# Patient Record
Sex: Female | Born: 1990 | Hispanic: No | Marital: Married | Smoking: Never smoker
Health system: Southern US, Community
[De-identification: ages and names within clinical notes are randomized; demographics above are authoritative.]

## PROBLEM LIST (undated history)

## (undated) ENCOUNTER — Inpatient Hospital Stay (HOSPITAL_COMMUNITY): Payer: Self-pay

## (undated) ENCOUNTER — Emergency Department (HOSPITAL_COMMUNITY): Admission: EM | Payer: 59 | Source: Home / Self Care

## (undated) DIAGNOSIS — Z789 Other specified health status: Secondary | ICD-10-CM

## (undated) DIAGNOSIS — M25512 Pain in left shoulder: Secondary | ICD-10-CM

## (undated) DIAGNOSIS — K219 Gastro-esophageal reflux disease without esophagitis: Secondary | ICD-10-CM

## (undated) HISTORY — DX: Gastro-esophageal reflux disease without esophagitis: K21.9

## (undated) HISTORY — PX: NO PAST SURGERIES: SHX2092

## (undated) SURGERY — Surgical Case
Anesthesia: *Unknown

---

## 2017-02-21 ENCOUNTER — Encounter (HOSPITAL_COMMUNITY): Payer: Self-pay | Admitting: *Deleted

## 2017-02-21 ENCOUNTER — Inpatient Hospital Stay (HOSPITAL_COMMUNITY): Payer: BLUE CROSS/BLUE SHIELD

## 2017-02-21 ENCOUNTER — Other Ambulatory Visit: Payer: Self-pay

## 2017-02-21 ENCOUNTER — Inpatient Hospital Stay (HOSPITAL_COMMUNITY)
Admission: AD | Admit: 2017-02-21 | Discharge: 2017-02-21 | Disposition: A | Discharge status: Home / Self Care | Payer: BLUE CROSS/BLUE SHIELD | Source: Ambulatory Visit | Attending: Obstetrics and Gynecology | Admitting: Obstetrics and Gynecology

## 2017-02-21 DIAGNOSIS — O200 Threatened abortion: Secondary | ICD-10-CM

## 2017-02-21 DIAGNOSIS — Z3A08 8 weeks gestation of pregnancy: Secondary | ICD-10-CM | POA: Insufficient documentation

## 2017-02-21 DIAGNOSIS — R58 Hemorrhage, not elsewhere classified: Secondary | ICD-10-CM

## 2017-02-21 DIAGNOSIS — O4690 Antepartum hemorrhage, unspecified, unspecified trimester: Secondary | ICD-10-CM

## 2017-02-21 DIAGNOSIS — O4691 Antepartum hemorrhage, unspecified, first trimester: Secondary | ICD-10-CM | POA: Diagnosis not present

## 2017-02-21 LAB — URINALYSIS, ROUTINE W REFLEX MICROSCOPIC
Bilirubin Urine: NEGATIVE
GLUCOSE, UA: NEGATIVE mg/dL
KETONES UR: NEGATIVE mg/dL
Leukocytes, UA: NEGATIVE
Nitrite: NEGATIVE
PH: 5 (ref 5.0–8.0)
Protein, ur: NEGATIVE mg/dL
SPECIFIC GRAVITY, URINE: 1.023 (ref 1.005–1.030)

## 2017-02-21 LAB — WET PREP, GENITAL
CLUE CELLS WET PREP: NONE SEEN
SPERM: NONE SEEN
TRICH WET PREP: NONE SEEN
YEAST WET PREP: NONE SEEN

## 2017-02-21 LAB — POCT PREGNANCY, URINE: Preg Test, Ur: POSITIVE — AB

## 2017-02-21 NOTE — Progress Notes (Signed)
AVS E signature not working in room 1.  Patient signed printed copy.

## 2017-02-21 NOTE — MAU Note (Signed)
Chief Complaint: No chief complaint on file.   None    SUBJECTIVE HPI: Anne Whitaker is a 26 y.o. G1P0000 at 6960w2d who presents to Maternity Admissions reporting bleeding.  Pt states had intercourse yesterday.  Bleeding is brown today.  Denies cramping or pain.  Does have discharge with slight odor.  Location: vaginal bleeding Quality: scant Severity: 0/10 on pain scale Duration: yesterday  Modifying factors: None Associated signs and symptoms: after intercourse  History reviewed. No pertinent past medical history. OB History  Gravida Para Term Preterm AB Living  1 0 0 0 0 0  SAB TAB Ectopic Multiple Live Births  0 0 0 0 0    # Outcome Date GA Lbr Len/2nd Weight Sex Delivery Anes PTL Lv  1 Current              History reviewed. No pertinent surgical history. Social History   Socioeconomic History  . Marital status: Married    Spouse name: Not on file  . Number of children: Not on file  . Years of education: Not on file  . Highest education level: Not on file  Social Needs  . Financial resource strain: Not on file  . Food insecurity - worry: Not on file  . Food insecurity - inability: Not on file  . Transportation needs - medical: Not on file  . Transportation needs - non-medical: Not on file  Occupational History  . Not on file  Tobacco Use  . Smoking status: Never Smoker  . Smokeless tobacco: Never Used  Substance and Sexual Activity  . Alcohol use: No    Frequency: Never  . Drug use: No  . Sexual activity: Yes  Other Topics Concern  . Not on file  Social History Narrative  . Not on file   History reviewed. No pertinent family history. No current facility-administered medications on file prior to encounter.    No current outpatient medications on file prior to encounter.   No Known Allergies  I have reviewed patient's Past Medical Hx, Surgical Hx, Family Hx, Social Hx, medications and allergies.   Review of Systems  HENT: Negative.   Eyes:  Negative.   Respiratory: Negative.   Cardiovascular: Negative.   Gastrointestinal: Negative.   Endocrine: Negative.   Genitourinary: Positive for vaginal bleeding.  Musculoskeletal: Negative.   Allergic/Immunologic: Negative.   Neurological: Negative.   Hematological: Negative.   Psychiatric/Behavioral: Negative.     OBJECTIVE Patient Vitals for the past 24 hrs:  BP Temp Temp src Pulse Resp SpO2 Weight  02/21/17 1419 (!) 115/59 98.3 F (36.8 C) Oral 65 17 100 % 69.3 kg (152 lb 12 oz)   Constitutional: Well-developed, well-nourished female in no acute distress.  Cardiovascular: normal rate Respiratory: normal rate and effort.  GI: Abd soft, non-tender, gravid appropriate for gestational age. Pos BS x 4 MS: Extremities nontender, no edema, normal ROM Neurologic: Alert and oriented x 4.  GU: Neg CVAT.  SPECULUM EXAM: NEFG, physiologic discharge,old blood noted, cervix clean  BIMANUAL: cervix closed; uterus 9 week size, no adnexal tenderness or masses.  No CMT.  LAB RESULTS Results for orders placed or performed during the hospital encounter of 02/21/17 (from the past 24 hour(s))  Urinalysis, Routine w reflex microscopic     Status: Abnormal   Collection Time: 02/21/17  2:21 PM  Result Value Ref Range   Color, Urine YELLOW YELLOW   APPearance HAZY (A) CLEAR   Specific Gravity, Urine 1.023 1.005 - 1.030   pH  5.0 5.0 - 8.0   Glucose, UA NEGATIVE NEGATIVE mg/dL   Hgb urine dipstick MODERATE (A) NEGATIVE   Bilirubin Urine NEGATIVE NEGATIVE   Ketones, ur NEGATIVE NEGATIVE mg/dL   Protein, ur NEGATIVE NEGATIVE mg/dL   Nitrite NEGATIVE NEGATIVE   Leukocytes, UA NEGATIVE NEGATIVE   RBC / HPF 0-5 0 - 5 RBC/hpf   WBC, UA 0-5 0 - 5 WBC/hpf   Bacteria, UA MANY (A) NONE SEEN   Squamous Epithelial / LPF 0-5 (A) NONE SEEN   Mucus PRESENT   Pregnancy, urine POC     Status: Abnormal   Collection Time: 02/21/17  2:43 PM  Result Value Ref Range   Preg Test, Ur POSITIVE (A) NEGATIVE   wet mount negative  IMAGING IMPRESSION: 1. Single living intrauterine gestation at 8 weeks 5 days by crown-rump length, concordant with provided menstrual dating. 2. No acute first-trimester gestational abnormality.  MAU COURSE Orders Placed This Encounter  Procedures  . Urinalysis, Routine w reflex microscopic  . Pregnancy, urine POC  OB US No orders of the defined types were placed in this encounter.   MDM PE, speculum exam, wet mount sent.  GC/Chlamydia in process at office. ASSESSMENT Threatened abortion  PLAN Discharge home in stable condition.  Discussed US wnl.  F/U regular visit.  Discussed common for bleeding after intercourse.   Allergies as of 02/21/2017   No Known Allergies     Medication List    You have not been prescribed any medications.      Kenney Housemanrothero, Analyssa Downs Jean, CNM 02/21/2017  4:40 PM

## 2017-02-21 NOTE — Discharge Instructions (Signed)
Vaginal Bleeding During Pregnancy, First Trimester °A small amount of bleeding (spotting) from the vagina is common in early pregnancy. Sometimes the bleeding is normal and is not a problem, and sometimes it is a sign of something serious. Be sure to tell your doctor about any bleeding from your vagina right away. °Follow these instructions at home: °· Watch your condition for any changes. °· Follow your doctor's instructions about how active you can be. °· If you are on bed rest: °? You may need to stay in bed and only get up to use the bathroom. °? You may be allowed to do some activities. °? If you need help, make plans for someone to help you. °· Write down: °? The number of pads you use each day. °? How often you change pads. °? How soaked (saturated) your pads are. °· Do not use tampons. °· Do not douche. °· Do not have sex or orgasms until your doctor says it is okay. °· If you pass any tissue from your vagina, save the tissue so you can show it to your doctor. °· Only take medicines as told by your doctor. °· Do not take aspirin because it can make you bleed. °· Keep all follow-up visits as told by your doctor. °Contact a doctor if: °· You bleed from your vagina. °· You have cramps. °· You have labor pains. °· You have a fever that does not go away after you take medicine. °Get help right away if: °· You have very bad cramps in your back or belly (abdomen). °· You pass large clots or tissue from your vagina. °· You bleed more. °· You feel light-headed or weak. °· You pass out (faint). °· You have chills. °· You are leaking fluid or have a gush of fluid from your vagina. °· You pass out while pooping (having a bowel movement). °This information is not intended to replace advice given to you by your health care provider. Make sure you discuss any questions you have with your health care provider. °Document Released: 06/27/2013 Document Revised: 07/19/2015 Document Reviewed: 10/18/2012 °Elsevier Interactive  Patient Education © 2018 [employer] ° °

## 2017-02-21 NOTE — MAU Note (Signed)
Yesterday and this morning, she is bleeding.  Yesterday was heavier - only 1 pad, today small amt of brown. Some cramping.  Has been to [facility] with this preg.

## 2017-02-24 NOTE — L&D Delivery Note (Addendum)
Delivery Note Called by Drenda FreezeKatie Page stating the pt is s/p epidural with decels down to 90s.  Bernerd PhoNancy Prothero, CNM and Florentina AddisonKatie Page, CNM will start pushing with the pt.  Upon my arrival, the pt was pushing and bulging membranes visible at the introitus.    At 8:31 PM a viable female was delivered via Vaginal, Breech (Presentation: footling breech).  APGAR: 1/4/7; Weight 1lb 3.8oz. Placenta status: spont intact.  Cord: 3V with the following complications: 3min head entrapment.  Cord pH: not enough to collect.  Anesthesia:  Epidural Episiotomy: None Lacerations: None Suture Repair: n/a Est. Blood Loss (mL):100 cc  Mom to postpartum.  Baby to NICU intubated.  Purcell Nailsngela Y Ladye Macnaughton 05/30/2017, 9:04 PM

## 2017-03-09 ENCOUNTER — Encounter (HOSPITAL_COMMUNITY): Payer: Self-pay | Admitting: *Deleted

## 2017-03-09 ENCOUNTER — Inpatient Hospital Stay (HOSPITAL_COMMUNITY)
Admission: AD | Admit: 2017-03-09 | Discharge: 2017-03-10 | Disposition: A | Discharge status: Home / Self Care | Payer: BLUE CROSS/BLUE SHIELD | Source: Ambulatory Visit | Attending: Obstetrics & Gynecology | Admitting: Obstetrics & Gynecology

## 2017-03-09 DIAGNOSIS — O209 Hemorrhage in early pregnancy, unspecified: Secondary | ICD-10-CM | POA: Diagnosis not present

## 2017-03-09 DIAGNOSIS — Z3A11 11 weeks gestation of pregnancy: Secondary | ICD-10-CM | POA: Diagnosis not present

## 2017-03-09 DIAGNOSIS — O2341 Unspecified infection of urinary tract in pregnancy, first trimester: Secondary | ICD-10-CM | POA: Diagnosis not present

## 2017-03-09 NOTE — MAU Note (Signed)
PT SAYS  VAG BLEEDING BEGAN IN DEC  X4 DAYS .    THEN RESTARTED  YESTERDAY .  HAS LOWER ABD  PAIN / CRAMPS IN TRIAGE - NO BLOOD IN  PANTS. LAST SEX- ON Friday .  IN OFFICE ON 12-27.  NEXT APPOINTMENT    1-22

## 2017-03-10 ENCOUNTER — Encounter (HOSPITAL_COMMUNITY): Payer: Self-pay | Admitting: *Deleted

## 2017-03-10 LAB — URINALYSIS, ROUTINE W REFLEX MICROSCOPIC
BILIRUBIN URINE: NEGATIVE
Glucose, UA: NEGATIVE mg/dL
Ketones, ur: 5 mg/dL — AB
LEUKOCYTES UA: NEGATIVE
Nitrite: NEGATIVE
PH: 5 (ref 5.0–8.0)
Protein, ur: NEGATIVE mg/dL
SPECIFIC GRAVITY, URINE: 1.029 (ref 1.005–1.030)

## 2017-03-10 MED ORDER — AMOXICILLIN-POT CLAVULANATE 875-125 MG PO TABS: 1.0000 | ORAL_TABLET | Freq: Two times a day (BID) | ORAL | 0 refills | Status: AC

## 2017-03-10 NOTE — MAU Provider Note (Signed)
Chief Complaint: Vaginal Bleeding   First Provider Initiated Contact with Patient 03/10/17 0006     SUBJECTIVE HPI: Anne Whitaker is a 27 y.o. G1P0000 at [redacted]w[redacted]d who presents to Maternity Admissions reporting bleeding x 2 days  Manson Passey today.  Having some coccyx pain today.  Does do heavy lifting at work.  Location: lower abd and coccyx Quality: cramping Severity: 3/10 on pain scale Duration: 2 days No past medical history on file. OB History  Gravida Para Term Preterm AB Living  1 0 0 0 0 0  SAB TAB Ectopic Multiple Live Births  0 0 0 0 0    # Outcome Date GA Lbr Len/2nd Weight Sex Delivery Anes PTL Lv  1 Current              No past surgical history on file. Social History   Socioeconomic History  . Marital status: Married    Spouse name: Not on file  . Number of children: Not on file  . Years of education: Not on file  . Highest education level: Not on file  Social Needs  . Financial resource strain: Not on file  . Food insecurity - worry: Not on file  . Food insecurity - inability: Not on file  . Transportation needs - medical: Not on file  . Transportation needs - non-medical: Not on file  Occupational History  . Not on file  Tobacco Use  . Smoking status: Never Smoker  . Smokeless tobacco: Never Used  Substance and Sexual Activity  . Alcohol use: No    Frequency: Never  . Drug use: No  . Sexual activity: Yes  Other Topics Concern  . Not on file  Social History Narrative  . Not on file   No family history on file. No current facility-administered medications on file prior to encounter.    No current outpatient medications on file prior to encounter.   No Known Allergies  I have reviewed patient's Past Medical Hx, Surgical Hx, Family Hx, Social Hx, medications and allergies.   Review of Systems  Constitutional: Negative.   HENT: Negative.   Eyes: Negative.   Respiratory: Negative.   Cardiovascular: Negative.   Gastrointestinal: Positive for  abdominal distention.  Endocrine: Negative.   Genitourinary: Positive for vaginal bleeding.  Musculoskeletal: Positive for back pain.  Allergic/Immunologic: Negative.   Neurological: Negative.   Hematological: Negative.   Psychiatric/Behavioral: Negative.     OBJECTIVE Patient Vitals for the past 24 hrs:  BP Temp Temp src Pulse Resp Height Weight  03/09/17 2251 114/60 97.9 F (36.6 C) Oral 63 18 5\' 2"  (1.575 m) 68.2 kg (150 lb 4 oz)   Constitutional: Well-developed, well-nourished female in no acute distress.  Cardiovascular: normal rate Respiratory: normal rate and effort.  GI: Abd soft, non-tender, gravid appropriate for gestational age. FHT 160 MS: Extremities nontender, no edema, normal ROM Neurologic: Alert and oriented x 4.  GU: Neg CVAT.  Pain with palpation at coccyx  SPECULUM EXAM: NEFG, physiologic discharge,dark brown blood noted,  BIMANUAL: cervix closed uterus 12  Week size, no adnexal tenderness or masses.  No CMT.  LAB RESULTS UA pending   Positive UTI per culture noted in office no treatment. + KLEB O POS IMAGING US Ob Comp Less 14 Wks  Result Date: 02/21/2017 CLINICAL DATA:  27 year old pregnant female presents with vaginal bleeding. EDC by LMP: 09/24/2017, projecting to an expected gestational age of [redacted] weeks 2 days. EXAM: OBSTETRIC <14 WK ULTRASOUND TECHNIQUE: Transabdominal ultrasound was  performed for evaluation of the gestation as well as the maternal uterus and adnexal regions. COMPARISON:  None. FINDINGS: Intrauterine gestational sac: Single intrauterine gestational sac appears normal in size, shape and position. Yolk sac:  Visualized. Fetus: Visualized. Fetal anatomy not assessed at this early gestational age. Fetal Cardiac Activity: Regular rate and rhythm. Fetal Heart Rate: 175 bpm CRL:   21.4  mm   8 w 5 d                  US EDC: 09/28/2017 Subchorionic hemorrhage:  None visualized. Maternal uterus/adnexae: Anteverted uterus. No uterine fibroids. No  abnormal ovarian or adnexal masses are demonstrated on limited views. No abnormal free fluid in the pelvis. IMPRESSION: 1. Single living intrauterine gestation at 8 weeks 5 days by crown-rump length, concordant with provided menstrual dating. 2. No acute first-trimester gestational abnormality. Electronically Signed   By: Delbert PhenixJason A Poff M.D.   On: 02/21/2017 17:51      MDM Reviewed lab result in lab and exam.  Will treat for UTI ASSESSMENT Bleeding in pregnancy UTI in pregnancy  PLAN Discharge home in stable condition.  Discussed treatment of UTI.  Discussed normal US at 9 weeks with FHT + today and closed cervix.  Allergies as of 03/10/2017   No Known Allergies     Medication List    You have not been prescribed any medications.      Kenney Housemanrothero, Alycea Segoviano Jean, CNM 03/10/2017  12:14 AM

## 2017-03-10 NOTE — Discharge Instructions (Signed)

## 2017-05-25 ENCOUNTER — Inpatient Hospital Stay (HOSPITAL_COMMUNITY)
Admission: AD | Admit: 2017-05-25 | Discharge: 2017-06-01 | DRG: 805 | Disposition: A | Discharge status: Home / Self Care | Payer: BLUE CROSS/BLUE SHIELD | Source: Ambulatory Visit | Attending: Obstetrics and Gynecology | Admitting: Obstetrics and Gynecology

## 2017-05-25 ENCOUNTER — Inpatient Hospital Stay (HOSPITAL_COMMUNITY): Payer: BLUE CROSS/BLUE SHIELD

## 2017-05-25 ENCOUNTER — Encounter (HOSPITAL_COMMUNITY): Payer: Self-pay | Admitting: *Deleted

## 2017-05-25 DIAGNOSIS — F419 Anxiety disorder, unspecified: Secondary | ICD-10-CM | POA: Diagnosis present

## 2017-05-25 DIAGNOSIS — O328XX0 Maternal care for other malpresentation of fetus, not applicable or unspecified: Secondary | ICD-10-CM | POA: Diagnosis present

## 2017-05-25 DIAGNOSIS — Z3A22 22 weeks gestation of pregnancy: Secondary | ICD-10-CM | POA: Diagnosis not present

## 2017-05-25 DIAGNOSIS — O99344 Other mental disorders complicating childbirth: Secondary | ICD-10-CM | POA: Diagnosis present

## 2017-05-25 DIAGNOSIS — O26892 Other specified pregnancy related conditions, second trimester: Secondary | ICD-10-CM

## 2017-05-25 DIAGNOSIS — R109 Unspecified abdominal pain: Secondary | ICD-10-CM

## 2017-05-25 DIAGNOSIS — O42112 Preterm premature rupture of membranes, onset of labor more than 24 hours following rupture, second trimester: Secondary | ICD-10-CM | POA: Diagnosis present

## 2017-05-25 DIAGNOSIS — O42919 Preterm premature rupture of membranes, unspecified as to length of time between rupture and onset of labor, unspecified trimester: Secondary | ICD-10-CM

## 2017-05-25 DIAGNOSIS — Z3689 Encounter for other specified antenatal screening: Secondary | ICD-10-CM

## 2017-05-25 DIAGNOSIS — O3432 Maternal care for cervical incompetence, second trimester: Secondary | ICD-10-CM | POA: Diagnosis present

## 2017-05-25 HISTORY — DX: Pain in left shoulder: M25.512

## 2017-05-25 HISTORY — DX: Other specified health status: Z78.9

## 2017-05-25 LAB — URINALYSIS, ROUTINE W REFLEX MICROSCOPIC
BILIRUBIN URINE: NEGATIVE
GLUCOSE, UA: NEGATIVE mg/dL
Ketones, ur: NEGATIVE mg/dL
NITRITE: NEGATIVE
PH: 7 (ref 5.0–8.0)
Protein, ur: NEGATIVE mg/dL
SPECIFIC GRAVITY, URINE: 1.018 (ref 1.005–1.030)

## 2017-05-25 LAB — CBC
HEMATOCRIT: 29.8 % — AB (ref 36.0–46.0)
Hemoglobin: 10.6 g/dL — ABNORMAL LOW (ref 12.0–15.0)
MCH: 33 pg (ref 26.0–34.0)
MCHC: 35.6 g/dL (ref 30.0–36.0)
MCV: 92.8 fL (ref 78.0–100.0)
PLATELETS: 216 10*3/uL (ref 150–400)
RBC: 3.21 MIL/uL — ABNORMAL LOW (ref 3.87–5.11)
RDW: 14.1 % (ref 11.5–15.5)
WBC: 11.2 10*3/uL — ABNORMAL HIGH (ref 4.0–10.5)

## 2017-05-25 LAB — WET PREP, GENITAL
Clue Cells Wet Prep HPF POC: NONE SEEN
SPERM: NONE SEEN
TRICH WET PREP: NONE SEEN
Yeast Wet Prep HPF POC: NONE SEEN

## 2017-05-25 LAB — TYPE AND SCREEN
ABO/RH(D): O POS
Antibody Screen: NEGATIVE

## 2017-05-25 LAB — OB RESULTS CONSOLE GBS: GBS: NEGATIVE

## 2017-05-25 LAB — OB RESULTS CONSOLE RUBELLA ANTIBODY, IGM: Rubella: IMMUNE

## 2017-05-25 LAB — AMNISURE RUPTURE OF MEMBRANE (ROM) NOT AT ARMC: Amnisure ROM: POSITIVE

## 2017-05-25 LAB — OB RESULTS CONSOLE HIV ANTIBODY (ROUTINE TESTING): HIV: NONREACTIVE

## 2017-05-25 LAB — GROUP B STREP BY PCR: Group B strep by PCR: NEGATIVE

## 2017-05-25 LAB — ABO/RH: ABO/RH(D): O POS

## 2017-05-25 LAB — OB RESULTS CONSOLE HEPATITIS B SURFACE ANTIGEN: HEP B S AG: NEGATIVE

## 2017-05-25 MED ORDER — AZITHROMYCIN 250 MG PO TABS
1000.0000 mg | ORAL_TABLET | Freq: Once | ORAL | Status: AC
  Administered 2017-05-25: 1000 mg via ORAL
  Filled 2017-05-25: qty 4

## 2017-05-25 MED ORDER — LACTATED RINGERS IV SOLN
INTRAVENOUS | Status: DC
  Administered 2017-05-25 – 2017-05-30 (×12): via INTRAVENOUS

## 2017-05-25 MED ORDER — OXYCODONE-ACETAMINOPHEN 5-325 MG PO TABS
2.0000 | ORAL_TABLET | Freq: Once | ORAL | Status: AC
  Administered 2017-05-25: 2 via ORAL
  Filled 2017-05-25: qty 2

## 2017-05-25 MED ORDER — BETAMETHASONE SOD PHOS & ACET 6 (3-3) MG/ML IJ SUSP
12.0000 mg | INTRAMUSCULAR | Status: AC
  Administered 2017-05-26 – 2017-05-27 (×2): 12 mg via INTRAMUSCULAR
  Filled 2017-05-25 (×2): qty 2

## 2017-05-25 MED ORDER — NIFEDIPINE 10 MG PO CAPS
10.0000 mg | ORAL_CAPSULE | Freq: Four times a day (QID) | ORAL | Status: DC
  Administered 2017-05-25 – 2017-05-30 (×11): 10 mg via ORAL
  Filled 2017-05-25 (×15): qty 1

## 2017-05-25 MED ORDER — SODIUM CHLORIDE 0.9 % IV SOLN
2.0000 g | Freq: Four times a day (QID) | INTRAVENOUS | Status: AC
  Administered 2017-05-25 – 2017-05-27 (×8): 2 g via INTRAVENOUS
  Filled 2017-05-25 (×3): qty 2
  Filled 2017-05-25: qty 2000
  Filled 2017-05-25 (×2): qty 2
  Filled 2017-05-25: qty 2000
  Filled 2017-05-25 (×2): qty 2

## 2017-05-25 MED ORDER — PANTOPRAZOLE SODIUM 40 MG IV SOLR
40.0000 mg | Freq: Every day | INTRAVENOUS | Status: DC
  Administered 2017-05-25 – 2017-05-27 (×3): 40 mg via INTRAVENOUS
  Filled 2017-05-25 (×4): qty 40

## 2017-05-25 MED ORDER — INDOMETHACIN 25 MG PO CAPS
50.0000 mg | ORAL_CAPSULE | Freq: Four times a day (QID) | ORAL | Status: AC
  Administered 2017-05-25 – 2017-05-28 (×12): 50 mg via ORAL
  Filled 2017-05-25 (×12): qty 2

## 2017-05-25 NOTE — Progress Notes (Signed)
S: Pt is currently not having any pain; Denies contractions. Interpreter per Ipad during our discussion. I discussed the positive amnisure with the patient and our plan over the next 48 hours. Steroids start tonight and MFM and NICU consults in the morning. O: BP 101/61   Pulse 70   Temp 98.5 F (36.9 C) (Oral)   Resp 16   Wt 155 lb 1.9 oz (70.4 kg)   LMP 12/18/2016   BMI 28.37 kg/m  A: Previable gestation at 22+4     Advanced Dilation     SROM per positive amnisure     Stable P: Bedside u/s tonight for weight and afi      Percocet 10/325 x 1 to help pt rest      Regular diet      First dose of steroids at [facility] 05/26/17      Trendelenburg       Continue existing orders  Illene BolusLori Clemmons CNM 05/25/17 800pm

## 2017-05-25 NOTE — Plan of Care (Signed)
Discussed admission assessment and POC with patient through Arabic ipad interpreter.  Spoke with MD on phone regarding positive amnisure and ongoing plan of care.  MD on call tonight will come and speak with patient.

## 2017-05-25 NOTE — H&P (Addendum)
Anne Whitaker is a 27 y.o. female G1P0 at 22 weeks 4 days EGA, LMP 12/18/16 EDC 09/24/2017,  presenting for complaints of abdominal cramping and mid lower back pain for one day.  She also had vaginal bleeding that started 3 days ago.  She also noticed increased vaginal discharge that has been green in color.   Prenatal course has been significant for first trimester bleeding that resolved, UTI was treated in first trimester. She had a normal anatomy ultrasound at 19 weeks 2 days with cervical length of 4.4 cm.   Patient is originally from [place], she speaks [place], [place] and [place], she declined interpreter services.    OB History    Gravida  1   Para  0   Term  0   Preterm  0   AB  0   Living  0     SAB  0   TAB  0   Ectopic  0   Multiple  0   Live Births  0          Past Medical History:  Diagnosis Date  . Medical history non-contributory    Past Surgical History:  Procedure Laterality Date  . NO PAST SURGERIES     Family History: family history is not on file. Social History:  reports that she has never smoked. She has never used smokeless tobacco. She reports that she does not drink alcohol or use drugs.     Maternal Diabetes: No Genetic Screening: Normal Maternal Ultrasounds/Referrals: Normal Fetal Ultrasounds or other Referrals:  None Maternal Substance Abuse:  No Significant Maternal Medications:  Meds include: Other: Augmentin in first trimester for UTI Significant Maternal Lab Results:  None Other Comments:  None  ROS  Constitutional: Denies fevers/chills Cardiovascular: Denies chest pain or palpitations Pulmonary: Denies coughing or wheezing Gastrointestinal: Denies nausea, vomiting or diarrhea Genitourinary: Denies dysuria, urgency or frequency. With abdominal cramping, had vaginal bleeding, increased vaginal discharge.  Musculoskeletal: Denies muscle or joint aches. With mid lower back pain.   Neurology: Denies abnormal sensations such  as tingling or numbness.    History Dilation: 4 Effacement (%): 80 Station: -3 Exam by:: Dr. Sallye OberKulwa Blood pressure (!) 111/57, pulse 83, temperature 98.5 F (36.9 C), temperature source Oral, resp. rate 16, weight 70.4 kg (155 lb 1.9 oz), last menstrual period 12/18/2016.  Exam Physical Exam  Constitutional: She is oriented to person, place, and time. She appears well-developed and well-nourished.  HENT:  Head: Normocephalic and atraumatic.  Neck: Normal range of motion.  Cardiovascular: Normal rate, regular rhythm and normal heart sounds.   Respiratory: Effort normal and breath sounds normal.  GI: Soft. Bowel sounds are normal.  Neurological: She is alert and oriented to person, place, and time.  Skin: Skin is warm and dry.  Psychiatric: She has a normal mood and affect. Her behavior is normal.  Genitourinary: Cervix 4cm dilated visually, with bulging intact membranes.  No pooling of fluid. Small white mucousy discharge in vagina.   TOCO: No contractions. Some occasional irritability.  Bedside Ultrasound: Cephalic presentation. Grossly normal fluid around baby.   Prenatal labs: ABO, Rh:  O positive Antibody:  Negative Rubella:  Immune RPR:   NR HBsAg:   Neg HIV:   Neg GBS:   Drawn today  CBC    Component Value Date/Time   WBC 11.2 (H) 05/25/2017 1417   RBC 3.21 (L) 05/25/2017 1417   HGB 10.6 (L) 05/25/2017 1417   HCT 29.8 (L) 05/25/2017 1417  PLT 216 05/25/2017 1417   MCV 92.8 05/25/2017 1417   MCH 33.0 05/25/2017 1417   MCHC 35.6 05/25/2017 1417   RDW 14.1 05/25/2017 1417   Urinalysis    Component Value Date/Time   COLORURINE YELLOW 05/25/2017 1303   APPEARANCEUR HAZY (A) 05/25/2017 1303   LABSPEC 1.018 05/25/2017 1303   PHURINE 7.0 05/25/2017 1303   GLUCOSEU NEGATIVE 05/25/2017 1303   HGBUR SMALL (A) 05/25/2017 1303   BILIRUBINUR NEGATIVE 05/25/2017 1303   KETONESUR NEGATIVE 05/25/2017 1303   PROTEINUR NEGATIVE 05/25/2017 1303   NITRITE NEGATIVE  05/25/2017 1303   LEUKOCYTESUR SMALL (A) 05/25/2017 1303    Assessment/Plan: 27 y/o G1P0 at 22 weeks 4 days EGA with cervical incompetence  -Admit to L & D -Trendelenburg position -COntinuous TOCO --Admit for overnight observation.  Nifedipine and indomethacin per MFM telephone consult- Dr. Ezzard Standing. Defer betamethasone for now. Plan for MFM [place] in morning with MFM consult and consideration of rescue cerclage.   -GBS, wet mount, Urine culture, Amnisure drawn, blood type and screen, RPR tests.  -Discussed case with NICU, okay for patient admission- Resuscitation usually perfomed at 23 weeks of gestation or later- NICU consult placed in. Per NICU consider steroids from 23 weeks 5 days EGA.  -If patient remains stable consider regular diet in the PM then NPO after midnight.  Konrad Felix, MD.  05/25/2017, 4:48 PM

## 2017-05-25 NOTE — MAU Note (Signed)
Pt C/O  Light bleeding since March 30, has had 2-3 gushes of "yellow/green water" in the last 3 days, latest gush was this morning.  Also pain in lower abdomen & back since yesterday.

## 2017-05-25 NOTE — Consult Note (Signed)
[facility] 05/25/2017    9:44 PM  Neonatal Medicine Consultation         Anne Whitaker          MRN:  [id number]  I was called at the request of the patient's obstetrician (Dr. Sallye OberKulwa) to speak to this patient due to potential premature delivery at 23 weeks.    The patient's prenatal course includes PROM today (at 22 4/7 weeks).  She is admitted to L&D, and is receiving treatment that includes antibiotics, Trendelenburg position.  I recommended starting BMZ at 22 5/7 weeks.  The baby is a female.  I reviewed expectations for a baby born at <23 weeks, that we consider such newborns not viable and do not provide resuscitation.  For babies born at 3423 and 24 weeks, survival is more likely with each improvement in maturity, however such babies still have a high risk of dying or facing long term neurodevelopmental problems.  Such babies may not be provided resuscitation if parents do not want intervention, something that is not unreasonable given the high risk.  Gestational ages at and beyond 25 weeks have better survival and long term normal development, and in most cases we provide full resuscitation.  I did not go into details regarding expected treatment for babies receiving full support, however if mom does not deliver in the next 3 days, and would like her baby to receive full support (despite the high risk of dying or long-term neurodevelopmental problems if still extremely premature), we would be happy to provide an update regarding what complications and treatment to expect.  I spent 30 minutes reviewing the record, speaking to the patient, and entering appropriate documentation.     _____________________ Electronically Signed By: Angelita InglesMcCrae S. Ona Roehrs, MD Neonatologist

## 2017-05-26 ENCOUNTER — Inpatient Hospital Stay (HOSPITAL_COMMUNITY): Payer: BLUE CROSS/BLUE SHIELD

## 2017-05-26 ENCOUNTER — Other Ambulatory Visit: Payer: Self-pay

## 2017-05-26 LAB — RPR: RPR: NONREACTIVE

## 2017-05-26 MED ORDER — AZITHROMYCIN 250 MG PO TABS
500.0000 mg | ORAL_TABLET | ORAL | Status: DC
  Administered 2017-05-26 – 2017-05-29 (×4): 500 mg via ORAL
  Filled 2017-05-26 (×2): qty 2
  Filled 2017-05-26 (×2): qty 1
  Filled 2017-05-26: qty 2

## 2017-05-26 MED ORDER — ENOXAPARIN SODIUM 40 MG/0.4ML ~~LOC~~ SOLN
40.0000 mg | SUBCUTANEOUS | Status: DC
  Administered 2017-05-26 – 2017-05-29 (×4): 40 mg via SUBCUTANEOUS
  Filled 2017-05-26 (×5): qty 0.4

## 2017-05-26 MED ORDER — DOCUSATE SODIUM 100 MG PO CAPS
100.0000 mg | ORAL_CAPSULE | Freq: Every day | ORAL | Status: DC
  Administered 2017-05-26: 100 mg via ORAL
  Filled 2017-05-26: qty 1

## 2017-05-26 MED ORDER — ONDANSETRON HCL 4 MG PO TABS
8.0000 mg | ORAL_TABLET | Freq: Once | ORAL | Status: AC
  Administered 2017-05-26: 8 mg via ORAL
  Filled 2017-05-26: qty 2

## 2017-05-26 MED ORDER — POLYETHYLENE GLYCOL 3350 17 G PO PACK: 17.0000 g | PACK | Freq: Every day | ORAL | Status: DC

## 2017-05-26 MED ORDER — AMOXICILLIN 500 MG PO CAPS
500.0000 mg | ORAL_CAPSULE | Freq: Three times a day (TID) | ORAL | Status: DC
  Administered 2017-05-27 – 2017-05-30 (×11): 500 mg via ORAL
  Filled 2017-05-26 (×14): qty 1

## 2017-05-26 MED ORDER — POLYETHYLENE GLYCOL 3350 17 G PO PACK
17.0000 g | PACK | Freq: Every day | ORAL | Status: DC | PRN
  Administered 2017-05-27: 17 g via ORAL
  Filled 2017-05-26 (×2): qty 1

## 2017-05-26 MED ORDER — FAMOTIDINE 20 MG PO TABS
10.0000 mg | ORAL_TABLET | Freq: Once | ORAL | Status: AC
  Administered 2017-05-26: 10 mg via ORAL
  Filled 2017-05-26: qty 1

## 2017-05-26 NOTE — Progress Notes (Signed)
Pt without complaints.  No leakage of fluid or VB.  Good FM  BP (!) 101/54 (BP Location: Right Arm)   Pulse 76   Temp 98.8 F (37.1 C) (Oral)   Resp 16   Wt 70.4 kg (155 lb 1.9 oz)   LMP 12/18/2016   SpO2 99%   BMI 28.37 kg/m   FHTS 147  Toco none  Pt in NAD CV RRR Lungs CTAB abd  Gravid soft and NT GU no vb EXt no calf tenderness Results for orders placed or performed during the hospital encounter of 05/25/17 (from the past 72 hour(s))  Urinalysis, Routine w reflex microscopic     Status: Abnormal   Collection Time: 05/25/17  1:03 PM  Result Value Ref Range   Color, Urine YELLOW YELLOW   APPearance HAZY (A) CLEAR   Specific Gravity, Urine 1.018 1.005 - 1.030   pH 7.0 5.0 - 8.0   Glucose, UA NEGATIVE NEGATIVE mg/dL   Hgb urine dipstick SMALL (A) NEGATIVE   Bilirubin Urine NEGATIVE NEGATIVE   Ketones, ur NEGATIVE NEGATIVE mg/dL   Protein, ur NEGATIVE NEGATIVE mg/dL   Nitrite NEGATIVE NEGATIVE   Leukocytes, UA SMALL (A) NEGATIVE   RBC / HPF 0-5 0 - 5 RBC/hpf   WBC, UA 6-30 0 - 5 WBC/hpf   Bacteria, UA RARE (A) NONE SEEN   Squamous Epithelial / LPF 0-5 (A) NONE SEEN   Mucus PRESENT     Comment: Performed at [facility], [address] [place], [place] [address]  CBC     Status: Abnormal   Collection Time: 05/25/17  2:17 PM  Result Value Ref Range   WBC 11.2 (H) 4.0 - 10.5 K/uL   RBC 3.21 (L) 3.87 - 5.11 MIL/uL   Hemoglobin 10.6 (L) 12.0 - 15.0 g/dL   HCT 19.129.8 (L) 47.836.0 - 29.546.0 %   MCV 92.8 78.0 - 100.0 fL   MCH 33.0 26.0 - 34.0 pg   MCHC 35.6 30.0 - 36.0 g/dL   RDW 62.114.1 30.811.5 - 65.715.5 %   Platelets 216 150 - 400 K/uL    Comment: Performed at [facility], [address] [place], [place] [address]  Group B strep by PCR     Status: None   Collection Time: 05/25/17  3:35 PM  Result Value Ref Range   Group B strep by PCR NEGATIVE NEGATIVE    Comment: Performed at [facility], [address] [place], [place] [address]  RPR     Status: None   Collection Time: 05/25/17  3:57 PM  Result Value Ref Range   RPR Ser Ql Non Reactive Non Reactive    Comment: (NOTE) Performed At: [facility] [address] [place], [place] [address] Jolene SchimkeNagendra Sanjai MD UV:[phone] Performed at [facility], [address] [place], [place] [address]   Type and screen [facility]     Status: None   Collection Time: 05/25/17  3:57 PM  Result Value Ref Range   ABO/RH(D) O POS    Antibody Screen NEG    Sample Expiration      05/28/2017 Performed at [facility], [address] [place], [place] [address]   ABO/Rh     Status: None   Collection Time: 05/25/17  3:57 PM  Result Value Ref Range   ABO/RH(D)      O POS Performed at [facility], [address] [place], [place] [address]   Wet prep, genital     Status: Abnormal  Collection Time: 05/25/17  4:03 PM  Result Value Ref Range   Yeast Wet Prep HPF POC NONE SEEN NONE SEEN   Trich, Wet Prep NONE SEEN NONE SEEN   Clue Cells Wet Prep HPF POC NONE SEEN NONE SEEN   WBC, Wet Prep HPF POC MANY (A) NONE SEEN   Sperm NONE SEEN     Comment: Performed at [facility], [address], [place], [place] [address]  Amnisure rupture of membrane (rom)not at [facility]     Status: None   Collection Time: 05/25/17  5:26 PM  Result Value Ref Range   Amnisure ROM POSITIVE     Comment: Performed at [facility], [address], [place], [place] [address]    Assessment and Plan [redacted]w[redacted]d  PPROM AND PTL OR INCOMPETENT CERVIX PT SEEN BY MFM CONTINUE ABX  GBS NEG BMZ LOVENOX PROPHYLAXSIS DISCUSSED CARE WITH THE PT.  SHE AGREES WITH THE PLAN

## 2017-05-26 NOTE — Consult Note (Signed)
Maternal Fetal Medicine Consultation  Requesting Provider(s): Kulwa  Primary OB: [facility] Reason for consultation: advanced cervical change HPI: 27yo P0  At 22+5 weeks admitted yesterday after office visit showed cervix 4/80/BBOW on office bimanual exam. The patient has reported increased pelvic pressure and some pelvic discomfort for several weeks. She was admitted, begun on NSAIDs and nifedipine, along with latency antibiotics (ampicillin and azithromycin). She has seen NICU for consultation. Betamethasone has been withheld until 23 weeks. Since admission she has begun leaking fluid and had positive placental alpha microglobin 1 testing indicating probable PPROM. An US today showed a widely dilated internal os at 3 cm, prolapsing membranes, and fetal parts within the prolapse OB History: OB History    Gravida  1   Para  0   Term  0   Preterm  0   AB  0   Living  0     SAB  0   TAB  0   Ectopic  0   Multiple  0   Live Births  0           PMH:  Past Medical History:  Diagnosis Date  . Left shoulder pain   . Medical history non-contributory     PSH:  Past Surgical History:  Procedure Laterality Date  . NO PAST SURGERIES     Meds: Indomethacin, nifedipine, ampicillin, azithromycin Allergies: NKDA FH: See EPIC Soc: See EPIC  Review of Systems: no vaginal bleeding, no nausea/vomiting. All other systems reviewed and are negative except as noted above in the HPI  PE:  VS: See EPIC GEN: well-appearing female ABD: gravid, mild lower abdominal tenderness  Please see separate document for fetal ultrasound report.  A/P: Advanced cervical dilation with leaking fluid (PPROM) I discussed the clinical situation with the patient at length. Unfortunately she is not a candidate for an attempt at rescue cerclage given the extent of the membrane prolapse, the leaking fluid and the presence of fetal parts in the prolapsed portions of the membranes. Prognosis for continuation of  the pregnancy is guarded. NSAIDs should be discontinued after 3 days but nifedipine can be continued. I agree with the administration of betamethasone at 23 weeks. She is a candidate for neuroprotective MgSO4 and that can be considered past 23 weeks. Latency antibiotics should be discontinued after 3 days of the azithromycin and 7 daus of the ampicillin. We briefly discussed interventions for any subsequent pregnancies: 17-OHP will almost certainly be indicated, and TVCL from 16 weeks with cerclage if cervical shortening is noted is recommended. Her presentation is more consistent with preterm contractions rather than incompetent cervix, but only close observation in a subsequent pregnancy will help us rule that out  Thank you for the opportunity to be a part of the care of Anne Whitaker. Please contact our office if we can be of further assistance.   I spent approximately 30 minutes with this patient with over 50% of time spent in face-to-face counseling.

## 2017-05-26 NOTE — Progress Notes (Signed)
Sign out to Dr. Normand Sloopillard

## 2017-05-26 NOTE — Progress Notes (Signed)
Hospital day # 1 pregnancy at [redacted]w[redacted]d--Cervical dilation, positive amnisure 05/25/17  S:  Slept very little during night.  Some upper abdominal cramping, feels related to Trendelenburg and taking pills, reflux.  Denies N/V.      Perception of contractions: Very occasional mild cramping      Vaginal bleeding: None       Vaginal discharge:  Small amount clear fluid  O: BP 107/61   Pulse 67   Temp 98 F (36.7 C) (Oral)   Resp 16   Wt 70.4 kg (155 lb 1.9 oz)   LMP 12/18/2016   BMI 28.37 kg/m       FHR 154 on evening assessment at 2000      Contractions:   None on continuous toco      Uterus non-tender      Extremities: no significant edema and no signs of DVT   US last night:  Breech, with verbal report per RN of foot in the vagina.   AFV WNL, largest pocket 4.21   Anterior placenta   EFW 1+5, 63%ile          Labs:   Results for orders placed or performed during the hospital encounter of 05/25/17 (from the past 24 hour(s))  Urinalysis, Routine w reflex microscopic     Status: Abnormal   Collection Time: 05/25/17  1:03 PM  Result Value Ref Range   Color, Urine YELLOW YELLOW   APPearance HAZY (A) CLEAR   Specific Gravity, Urine 1.018 1.005 - 1.030   pH 7.0 5.0 - 8.0   Glucose, UA NEGATIVE NEGATIVE mg/dL   Hgb urine dipstick SMALL (A) NEGATIVE   Bilirubin Urine NEGATIVE NEGATIVE   Ketones, ur NEGATIVE NEGATIVE mg/dL   Protein, ur NEGATIVE NEGATIVE mg/dL   Nitrite NEGATIVE NEGATIVE   Leukocytes, UA SMALL (A) NEGATIVE   RBC / HPF 0-5 0 - 5 RBC/hpf   WBC, UA 6-30 0 - 5 WBC/hpf   Bacteria, UA RARE (A) NONE SEEN   Squamous Epithelial / LPF 0-5 (A) NONE SEEN   Mucus PRESENT   CBC     Status: Abnormal   Collection Time: 05/25/17  2:17 PM  Result Value Ref Range   WBC 11.2 (H) 4.0 - 10.5 K/uL   RBC 3.21 (L) 3.87 - 5.11 MIL/uL   Hemoglobin 10.6 (L) 12.0 - 15.0 g/dL   HCT 16.1 (L) 09.6 - 04.5 %   MCV 92.8 78.0 - 100.0 fL   MCH 33.0 26.0 - 34.0 pg   MCHC 35.6 30.0 - 36.0 g/dL   RDW  40.9 81.1 - 91.4 %   Platelets 216 150 - 400 K/uL  Group B strep by PCR     Status: None   Collection Time: 05/25/17  3:35 PM  Result Value Ref Range   Group B strep by PCR NEGATIVE NEGATIVE  RPR     Status: None   Collection Time: 05/25/17  3:57 PM  Result Value Ref Range   RPR Ser Ql Non Reactive Non Reactive  Type and screen [facility]     Status: None   Collection Time: 05/25/17  3:57 PM  Result Value Ref Range   ABO/RH(D) O POS    Antibody Screen NEG    Sample Expiration      05/28/2017 Performed at [facility], [address], [place], [place] [address]   ABO/Rh     Status: None   Collection Time: 05/25/17  3:57 PM  Result Value Ref Range  ABO/RH(D)      O POS Performed at [facility], [address] [place], [place] [address]   Wet prep, genital     Status: Abnormal   Collection Time: 05/25/17  4:03 PM  Result Value Ref Range   Yeast Wet Prep HPF POC NONE SEEN NONE SEEN   Trich, Wet Prep NONE SEEN NONE SEEN   Clue Cells Wet Prep HPF POC NONE SEEN NONE SEEN   WBC, Wet Prep HPF POC MANY (A) NONE SEEN   Sperm NONE SEEN   Amnisure rupture of membrane (rom)not at [facility]     Status: None   Collection Time: 05/25/17  5:26 PM  Result Value Ref Range   Amnisure ROM POSITIVE    GC/chlamydia pending from 4/1 Urine culture pending from 4/1       Meds:  . betamethasone acetate-betamethasone sodium phosphate  12 mg Intramuscular Q24 Hr x 2  . indomethacin  50 mg Oral Q6H  . NIFEdipine  10 mg Oral Q6H  . pantoprazole (PROTONIX) IV  40 mg Intravenous QHS   . ampicillin (OMNIPEN) IV Stopped (05/26/17 0223)  . lactated ringers 125 mL/hr at 05/25/17 2314   Received Azithromycin 1 gm po at 1919 Received 1st dose betamethasone at 0014 Has received 2 doses Ampicillin IV since admission.  Received Pepcid and Zofran at 0626 this am for upper abdominal pain.  NICU consult last night.  No resuscitation until >/= 23 weeks, and resuscitation plan at  23-24 weeks dependent on parental preference.  A: 4824w5d with cervical dilation, positive amnisure 4/1     Breech presentation     Stable  P: Continue current plan of care      Upcoming tests/treatments:  MFM consult today.      Recommend NICU consult update if patient carries past 23      weeks, per Dr. Michaelle CopasSmith's note.      Support to patient for concerns.      MDs will follow  Nigel BridgemanVicki Seydina Holliman CNM, [place] 05/26/2017 7:16 AM

## 2017-05-26 NOTE — Progress Notes (Signed)
I offered support to Anne Whitaker in this period of uncertainty.  She is emotionally flat and focused on the physical discomfort of being in the same position.  She has limited support here, just her husband and a friend.  She and her husband immigrated here [place] just over a year ago.  In my assessment, she does not want to talk about the emotional impact this is having on her at this time and so she is just "waiting," as she stated it. We will continue to check in on her as we are able, but please also page as needs arise.  Chaplain Dyanne CarrelKaty Brylynn Whitaker, Bcc Pager, [phone] 4:29 PM    05/26/17 1600  Clinical Encounter Type  Visited With Patient  Visit Type Spiritual support  Referral From Nurse  Spiritual Encounters  Spiritual Needs Emotional  Stress Factors  Patient Stress Factors Health changes

## 2017-05-27 LAB — CULTURE, OB URINE

## 2017-05-27 MED ORDER — BISACODYL 10 MG RE SUPP
10.0000 mg | Freq: Every day | RECTAL | Status: DC | PRN
  Administered 2017-05-27: 10 mg via RECTAL
  Filled 2017-05-27 (×2): qty 1

## 2017-05-27 MED ORDER — DOCUSATE SODIUM 100 MG PO CAPS
100.0000 mg | ORAL_CAPSULE | Freq: Two times a day (BID) | ORAL | Status: DC
  Administered 2017-05-27 – 2017-05-30 (×7): 100 mg via ORAL
  Filled 2017-05-27 (×7): qty 1

## 2017-05-27 MED ORDER — AMOXICILLIN 500 MG PO CAPS: 500.0000 mg | ORAL_CAPSULE | Freq: Three times a day (TID) | ORAL | Status: DC

## 2017-05-27 NOTE — Progress Notes (Signed)
CSW acknowledged consult.  CSW available for support secondary to [facility] and will await call from Chaplain before becoming involved.  CSW screening out referral at this time. Chaplin plans to meet with patient today and will re consult CSW if a need arises or at patient's request.   Blaine HamperAngel Boyd-Gilyard, MSW, LCSW Clinical Social Work [phone]

## 2017-05-27 NOTE — Progress Notes (Addendum)
Hospital day # 2 pregnancy at 4762w6d--PTL, SROM 05/25/17. FOOTLING BREECH AND LOVENOX PROPHYLAXIS  S:  Appears depressed--"tired of lying in bed".  Noting some constipation, feels bloated.        Perception of contractions: Occasional cramping, mild      Vaginal bleeding: None       Vaginal discharge:  Notes small amount leaking with movement.  Patient stated, "at 23 weeks, baby will be OK".  O: BP (!) 98/50 (BP Location: Left Arm)   Pulse 67   Temp 98.2 F (36.8 C) (Oral)   Resp 16   Ht 5' 2.99" (1.6 m)   Wt 70.3 kg (155 lb)   LMP 12/18/2016   SpO2 97%   BMI 27.46 kg/m       Fetal tracings:  FHR 155 on last assessment at 0035      Contractions:   None per monitor      Uterus gravid, mild tenderness in all quadrants      Abdomen--positive bowel sounds      Extremities: no significant edema and no signs of DVT          Labs:  GBS negative, GC/chlamydia pending, urine culture pending.      Meds:  . amoxicillin  500 mg Oral Q8H  . azithromycin  500 mg Oral Q24H  . docusate sodium  100 mg Oral Daily  . enoxaparin (LOVENOX) injection  40 mg Subcutaneous Q24H  . indomethacin  50 mg Oral Q6H  . NIFEdipine  10 mg Oral Q6H  . pantoprazole (PROTONIX) IV  40 mg Intravenous QHS   . ampicillin (OMNIPEN) IV 2 g (05/27/17 0748)  . lactated ringers 125 mL/hr at 05/27/17 [id number]     A: 7962w6d with PTL, advanced dilation, SROM 05/25/17     Stable  P: Continue current plan of care      Reviewed that viability cannot be ensured at 23 or 24 weeks, and baby would still be at significant risk of morbidity/mortality then and even beyond.        Upcoming tests/treatments:  Continue current care, support to patient for concerns. SW consult.  May also need updated NICU consult if pregnancy continues past 23 weeks, per note from Dr. Katrinka BlazingSmith. Colace to BID. Per MFM consult yesterday:   D/C NSAIDs after 3 days, OK to continue Procardia. Betamethasone at 23 weeks (this would be tomorrow) Candidate for  neuroprotection past 23 weeks D/c latency antibiotics after 3 days of azithromycin (currently on day 2), and 7 days of ampicillin (currently on day 3)      MDs will follow  Nigel BridgemanVicki Latham CNM, [place] 05/27/2017 7:47 AM   Patient seen REVIEWED FOOTLING BREECH PRESENTATION AND MODE OF DELIVERY.CESAREAN SECTION RECOMMENDED IF BABY REMAINS FOOTLING BREECH DISCUSSED WITH HIGH RISK OF REQUIRING A CLASSICAL INCISION WITH NEED FOR CESAREAN BIRTHS WITH ALL PREGNANCIES. PROCEDURE, R&B REVIEWED WILL REQUEST UPDATED NICU CONSULTATION TOMORROW SO MOM CAN DECIDE AT WHICH GESTATIONAL AGE SHE DESIRES ALL INTERVENTIONS PER MFM, ORDERS HAVE BEEN UPDATED TO D/C INDOMETHACIN AT 72 HOURS AND AMOXICILLIN AT 7 DAYS. BMZ COMPLETED THIS AM. SHOULD CONSIDER REPEAT BMZ AT 26 WEEKS.

## 2017-05-27 NOTE — Progress Notes (Signed)
I attempted follow up visit with pt, but she was sleeping.  I will refer to Chaplain Janetta HoraAmanda Davee Lomax to follow up with.  [employer] CorporationChaplain Katy Nicholai Willette, bcc Pager, [phone] 4:12 PM    05/27/17 1600  Clinical Encounter Type  Visited With Patient not available

## 2017-05-28 ENCOUNTER — Encounter (HOSPITAL_COMMUNITY): Payer: Self-pay

## 2017-05-28 MED ORDER — NIFEDIPINE 10 MG PO CAPS: 10.0000 mg | ORAL_CAPSULE | Freq: Four times a day (QID) | ORAL | Status: DC | PRN

## 2017-05-28 MED ORDER — PROMETHAZINE HCL 25 MG/ML IJ SOLN
12.5000 mg | Freq: Once | INTRAMUSCULAR | Status: AC
  Administered 2017-05-28: 12.5 mg via INTRAVENOUS
  Filled 2017-05-28: qty 1

## 2017-05-28 MED ORDER — NIFEDIPINE 10 MG PO CAPS: 10.0000 mg | ORAL_CAPSULE | Freq: Once | ORAL | Status: AC

## 2017-05-28 MED ORDER — PANTOPRAZOLE SODIUM 40 MG IV SOLR
20.0000 mg | Freq: Two times a day (BID) | INTRAVENOUS | Status: DC
  Administered 2017-05-28 – 2017-05-29 (×3): 20 mg via INTRAVENOUS
  Filled 2017-05-28 (×4): qty 20

## 2017-05-28 MED ORDER — BUTORPHANOL TARTRATE 1 MG/ML IJ SOLN
1.0000 mg | Freq: Once | INTRAMUSCULAR | Status: AC
  Administered 2017-05-28: 1 mg via INTRAVENOUS
  Filled 2017-05-28 (×2): qty 1

## 2017-05-28 NOTE — Progress Notes (Signed)
Attempted to visit with Anne Whitaker per referral by [facility] Claussen, but patient was asleep.  Will continue to follow.  Please page as further needs arise.  Maryanna ShapeAmanda M. Carley Hammedavee Lomax, M.Div. [facility] Chaplain Pager [phone] Office [phone]

## 2017-05-28 NOTE — Consult Note (Signed)
Neonatology Consult Note:  At the request of the patients obstetrician Dr. Cletis Media I met with Anne Whitaker who is currently 23 0/7 weeks with pregnancy complicated by  advanced cervical change, PPROM 4/1 and footling breech.  She is completing a betamethasone course and is on latency antibiotics.  She was previously seen by Dr. Tamala Julian at 22 4/7 when she was considered previable.  Now that she has reached 23 weeks we are reconsulting to discuss possible delivery at 23-24 weeks. We discussed morbidity/mortality at this gestional age, delivery room resuscitation, including intubation and surfactant in DR.  Discussed mechanical ventilation and risk for chronic lung disease, risk for IVH with potential for motor / cognitive deficits, ROP, NEC, sepsis, as well as temperature instability and feeding immaturity.  Discussed NG / OG feeds, benefits of MBM in reducing incidence of NEC.   Discussed likely length of stay. Anne Whitaker stated that she would like all resuscitative efforts at 23 weeks and of course was hopefull that she would remain pregnant for longer.     Thank you for allowing [place] to participate in her care.  Please call with questions.  Higinio Roger, DO  Neonatologist  The total length of face-to-face or floor / unit time for this encounter was 30 minutes.  Counseling and / or coordination of care was greater than fifty percent of the time.

## 2017-05-28 NOTE — Progress Notes (Addendum)
Hospital day # 3 pregnancy at 7926w0d--PTL, cervical dilation, PPROM since 05/25/17, footling breech and Lovenox prophylaxis. Betamethasone course 4/2 and 4/3  S:  Resting quietly. Reports slept a little better last night.      Perception of contractions: Occasional cramping, mild      Vaginal bleeding: Small amount pink/red spotting x 1 with wiping.       Vaginal discharge:  Still has occasional leaking with movement.       Requests completion of disability paperwork for herself, and note for husband.   O: BP (!) 90/55 (BP Location: Right Arm)   Pulse (!) 56   Temp 98.4 F (36.9 C) (Oral)   Resp 16   Ht 5' 2.99" (1.6 m)   Wt 70.3 kg (155 lb)   LMP 12/18/2016   SpO2 99%   BMI 27.46 kg/m       Fetal tracings:  FHR 154 on auscultation at 0228      Contractions:   None      Uterus non-tender      Extremities: no significant edema and no signs of DVT          Labs:  No results found for this or any previous visit (from the past 24 hour(s)).       Meds:  . amoxicillin  500 mg Oral Q8H  . azithromycin  500 mg Oral Q24H  . docusate sodium  100 mg Oral BID  . enoxaparin (LOVENOX) injection  40 mg Subcutaneous Q24H  . indomethacin  50 mg Oral Q6H  . NIFEdipine  10 mg Oral Q6H  . pantoprazole (PROTONIX) IV  40 mg Intravenous QHS   Indomethacin to be d/c'd s/p 1100 dose today BMZ course completed 4/2--4/3  A: 6426w0d with PTL, cervical dilation, PPROM since 05/25/17, footling breech and Lovenox prophylaxis. Betamethasone course 4/2 and 4/3 Stable  P: Continue current plan of care      Upcoming tests/treatments:  NICU to have updated consult in light of EGA of [redacted] weeks. Continue latency antibiotics until 06/01/17 Support to patient for concerns. Disability paperwork to office for completion      MDs will follow  Anne BridgemanVicki Whitaker CNM, [place] 05/28/2017 7:56 AM   Pt seen and examined.  She is having some epigastric pain and cramping BP (!) 107/59 (BP Location: Right Arm)   Pulse 60   Temp 97.9  F (36.6 C) (Oral)   Resp 19   Ht 5' 2.99" (1.6 m)   Wt 70.3 kg (155 lb)   LMP 12/18/2016   SpO2 99%   BMI 27.46 kg/m  Abd soft NT GU no vb  Will give procardia and with hold for bp parameters Stadol and phenergan for pain if pain continues will do US to check for further dilaiton Pt wants full neonatal resucitation but only wants classical CS if she makes it to 25 weeks or beyond.  She understands the risks of head intrapment

## 2017-05-28 NOTE — Progress Notes (Signed)
Rn called to have me assess pt due to her hurting more with contractions Sterile spec exam shows only BAG of fluid Digital exam- I feel no cervix Dr Charlotta Newtonzan aware  Nicu notified that delivery may be imminent Stadol and Phenergan for pain

## 2017-05-28 NOTE — Progress Notes (Signed)
Patient moved to birthing suites. Tarek Arabic interpreter used #[id number]  Lenox PondsAlexandria M Evangelynn Lochridge, RN

## 2017-05-29 LAB — RPR: RPR Ser Ql: NONREACTIVE

## 2017-05-29 MED ORDER — MAGNESIUM SULFATE 40 G IN LACTATED RINGERS - SIMPLE
2.0000 g/h | INTRAVENOUS | Status: AC
  Filled 2017-05-29: qty 500

## 2017-05-29 MED ORDER — NALBUPHINE HCL 10 MG/ML IJ SOLN
10.0000 mg | Freq: Four times a day (QID) | INTRAMUSCULAR | Status: DC | PRN
Start: 2017-05-29 — End: 2017-05-30
  Administered 2017-05-29: 10 mg via INTRAVENOUS
  Filled 2017-05-29: qty 1

## 2017-05-29 MED ORDER — BUTORPHANOL TARTRATE 1 MG/ML IJ SOLN
INTRAMUSCULAR | Status: AC
  Filled 2017-05-29: qty 1

## 2017-05-29 MED ORDER — MAGNESIUM SULFATE BOLUS VIA INFUSION
4.0000 g | Freq: Once | INTRAVENOUS | Status: AC
  Administered 2017-05-29: 4 g via INTRAVENOUS
  Filled 2017-05-29: qty 500

## 2017-05-29 MED ORDER — CYCLOBENZAPRINE HCL 10 MG PO TABS
10.0000 mg | ORAL_TABLET | Freq: Three times a day (TID) | ORAL | Status: DC | PRN
  Administered 2017-05-29: 10 mg via ORAL
  Filled 2017-05-29 (×2): qty 1

## 2017-05-29 MED ORDER — PROMETHAZINE HCL 25 MG/ML IJ SOLN
12.5000 mg | INTRAMUSCULAR | Status: DC | PRN
  Administered 2017-05-29 – 2017-05-30 (×3): 12.5 mg via INTRAVENOUS
  Filled 2017-05-29 (×3): qty 1

## 2017-05-29 MED ORDER — SERTRALINE HCL 25 MG PO TABS
25.0000 mg | ORAL_TABLET | Freq: Every day | ORAL | Status: DC
  Administered 2017-05-29 – 2017-05-30 (×2): 25 mg via ORAL
  Filled 2017-05-29 (×3): qty 1

## 2017-05-29 MED ORDER — BUTORPHANOL TARTRATE 1 MG/ML IJ SOLN
1.0000 mg | INTRAMUSCULAR | Status: DC | PRN
  Administered 2017-05-29 (×2): 1 mg via INTRAVENOUS
  Filled 2017-05-29: qty 1

## 2017-05-29 NOTE — Anesthesia Pain Management Evaluation Note (Signed)
  [facility] Pain Management Visit Note  Patient: Anne Whitaker, 27 y.o., female  "Hello I am a member of the anesthesia team at [facility]. We have an anesthesia team available at all times to provide care throughout the hospital, including epidural management and anesthesia for C-section. I don't know your plan for the delivery whether it a natural birth, water birth, IV sedation, nitrous supplementation, doula or epidural, but we want to meet your pain goals."   1.Was your pain managed to your expectations on prior hospitalizations?   No prior hospitalizations  2.What is your expectation for pain management during this hospitalization?     Epidural  3.How can we help you reach that goal? epidural  Record the patient's initial score and the patient's pain goal.   Pain: 0  Pain Goal: 5 [facility] wants you to be able to say your pain was always managed very well.  Clayden Withem 05/29/2017

## 2017-05-29 NOTE — Progress Notes (Signed)
14:22 interpreter Sharla KidneyMokhilisa [phone] used to discuss plan of care with MD and pt.

## 2017-05-29 NOTE — Progress Notes (Signed)
13:30 Pt. Called RN to room. Stated she is "done with this", she " cannot do this", and that she "just wants to birth the baby now".  Pt. Got up out of bed and stood at the bedside.  RN stood with pt. And assisted her back to bed.   MD notified of pt. Request and will come to speak with the pt. As soon as possible.

## 2017-05-29 NOTE — Progress Notes (Signed)
Pt requests bed changed.  Pt actually meant that she wanted a different bed.  No other bed type other than labor bed available on this unit.  Will check w/ house coverage to see if bed can be borrowed from the 3rd floor unit.

## 2017-05-29 NOTE — Progress Notes (Addendum)
Anne Whitaker is a 27 y.o. G1P0000 at 3483w1d  Subjective: Asked to see pt by Anne Bonitohristy, RN because the pt is requesting delivery now.  I had a lengthy discussion with pt via interpreter Anne Whitaker(Mokhilisa [phone]).  Pt's main issue is that she is anxious and uncomfortable from laying in the bed.  She reports rt shoulder pain similar to the left shoulder pain she previously had that required a cortisone injection.  She also reports nasal pain/congestion.  I discussed options for relief.  I informed pt that I did not feel comfortable delivering her now because she has a forebag based on Anne Whitaker, CNM exam from last night with bulging membranes.  I explained I would likely be facilitating termination of her pregnancy.  I explained that if she grossly ruptures or develops s/sxs of infection, that would be a more reasonable option.  The pt and her partner verbalize understanding and are ok with continued observation right now.  Her main issue is the discomfort and she requests a more comfortable bed.  Anne Whitaker, her nurse, will see if that is an option.     Objective: BP (!) 91/44   Pulse 80   Temp 98.4 F (36.9 C) (Axillary)   Resp 18   Ht 5' 2.99" (1.6 m)   Wt 155 lb (70.3 kg)   LMP 12/18/2016   SpO2 98%   BMI 27.46 kg/m  I/O last 3 completed shifts: In: 1029.2 [P.O.:240; I.V.:789.2] Out: 1900 [Urine:1900] Total I/O In: 925 [P.O.:300; I.V.:625] Out: 1100 [Urine:1100]  FHT:  140s, mod variability, occas variable UC:   Difficult to pick up on monitor SVE:   Dilation: 4 Effacement (%): 80 Station: -3 Exam by:: Anne Whitaker  Labs: Lab Results  Component Value Date   WBC 11.2 (H) 05/25/2017   HGB 10.6 (L) 05/25/2017   HCT 29.8 (L) 05/25/2017   MCV 92.8 05/25/2017   PLT 216 05/25/2017    Assessment / Plan: PPROM but still with bulging membranes admitted with contractions.  Will start zoloft for anxiety and give a dose of nubain now.  Cyclonbenzaprine ordered for prn shoulder  spasms/pain.  Labor: observation Preeclampsia:  no signs or symptoms of toxicity Fetal Wellbeing:  good currently for gestational age.  prognosis is poor. Pain Control:  no labor pain meds are needed but will give muscle relaxant for her shoulder. I/D:  GBS neg Anticipated MOD:  Pt had a discussion previously with MFM and pt desires vaginal delivery of breech at this gestational age  Anne Whitaker 05/29/2017, 3:00 PM

## 2017-05-29 NOTE — Progress Notes (Signed)
Hospital day # 4 pregnancy at 3647w1d--PPROM .  S:  No c/o of pressure.  Does not feel contractions.  No bleeding.  Feeling uncomfortable in the bed and asking for a different bed.         Vaginal discharge:  no significant change  O: BP 110/62   Pulse 84   Temp 98.4 F (36.9 C) (Oral)   Resp 16   Ht 5' 2.99" (1.6 m)   Wt 70.3 kg (155 lb)   LMP 12/18/2016   SpO2 98%   BMI 27.46 kg/m       Fetal tracings: Category 1      Contractions:   None              Labs:  Stable/reviewed        Meds: unchanged  A: 3447w1d with PPROM       unchanged  P: Continue current plan of care -discussed with Dr. Su Hiltoberts      Upcoming tests/treatments:  D/c EFM. Ordered NST twice daily.         Faylene MillionKathleen A Ethanjames Fontenot CNM, MSN 05/29/2017 11:03 PM

## 2017-05-30 ENCOUNTER — Inpatient Hospital Stay (HOSPITAL_COMMUNITY): Payer: BLUE CROSS/BLUE SHIELD | Admitting: Anesthesiology

## 2017-05-30 ENCOUNTER — Encounter (HOSPITAL_COMMUNITY): Payer: Self-pay | Admitting: Anesthesiology

## 2017-05-30 DIAGNOSIS — O42112 Preterm premature rupture of membranes, onset of labor more than 24 hours following rupture, second trimester: Secondary | ICD-10-CM | POA: Diagnosis present

## 2017-05-30 LAB — CBC
HEMATOCRIT: 28.5 % — AB (ref 36.0–46.0)
HEMOGLOBIN: 10 g/dL — AB (ref 12.0–15.0)
MCH: 32.6 pg (ref 26.0–34.0)
MCHC: 35.1 g/dL (ref 30.0–36.0)
MCV: 92.8 fL (ref 78.0–100.0)
Platelets: 225 10*3/uL (ref 150–400)
RBC: 3.07 MIL/uL — AB (ref 3.87–5.11)
RDW: 13.9 % (ref 11.5–15.5)
WBC: 9.9 10*3/uL (ref 4.0–10.5)

## 2017-05-30 LAB — CBC WITH DIFFERENTIAL/PLATELET
Basophils Absolute: 0 10*3/uL (ref 0.0–0.1)
Basophils Relative: 0 %
EOS PCT: 0 %
Eosinophils Absolute: 0 10*3/uL (ref 0.0–0.7)
HCT: 25.7 % — ABNORMAL LOW (ref 36.0–46.0)
HEMOGLOBIN: 9.2 g/dL — AB (ref 12.0–15.0)
LYMPHS ABS: 2.7 10*3/uL (ref 0.7–4.0)
Lymphocytes Relative: 30 %
MCH: 33.5 pg (ref 26.0–34.0)
MCHC: 35.8 g/dL (ref 30.0–36.0)
MCV: 93.5 fL (ref 78.0–100.0)
MONOS PCT: 5 %
Monocytes Absolute: 0.4 10*3/uL (ref 0.1–1.0)
Neutro Abs: 5.8 10*3/uL (ref 1.7–7.7)
Neutrophils Relative %: 65 %
PLATELETS: 196 10*3/uL (ref 150–400)
RBC: 2.75 MIL/uL — ABNORMAL LOW (ref 3.87–5.11)
RDW: 14 % (ref 11.5–15.5)
WBC: 8.9 10*3/uL (ref 4.0–10.5)

## 2017-05-30 LAB — PREPARE RBC (CROSSMATCH)

## 2017-05-30 MED ORDER — OXYTOCIN BOLUS FROM INFUSION
500.0000 mL | Freq: Once | INTRAVENOUS | Status: AC
  Administered 2017-05-30: 500 mL via INTRAVENOUS

## 2017-05-30 MED ORDER — ONDANSETRON HCL 4 MG/2ML IJ SOLN: 4.0000 mg | INTRAMUSCULAR | Status: DC | PRN

## 2017-05-30 MED ORDER — LIDOCAINE HCL (PF) 1 % IJ SOLN
30.0000 mL | INTRAMUSCULAR | Status: DC | PRN
  Filled 2017-05-30: qty 30

## 2017-05-30 MED ORDER — OXYTOCIN 40 UNITS IN LACTATED RINGERS INFUSION - SIMPLE MED
2.5000 [IU]/h | INTRAVENOUS | Status: DC
  Administered 2017-05-30: 2.5 [IU]/h via INTRAVENOUS
  Filled 2017-05-30: qty 1000

## 2017-05-30 MED ORDER — LIDOCAINE HCL (PF) 1 % IJ SOLN
INTRAMUSCULAR | Status: DC | PRN
  Administered 2017-05-30: 6 mL via EPIDURAL
  Administered 2017-05-30: 6 mL

## 2017-05-30 MED ORDER — FENTANYL CITRATE (PF) 100 MCG/2ML IJ SOLN
50.0000 ug | INTRAMUSCULAR | Status: DC | PRN
  Administered 2017-05-30 (×4): 50 ug via INTRAVENOUS
  Filled 2017-05-30 (×2): qty 2

## 2017-05-30 MED ORDER — SOD CITRATE-CITRIC ACID 500-334 MG/5ML PO SOLN
30.0000 mL | ORAL | Status: DC | PRN
  Filled 2017-05-30: qty 15

## 2017-05-30 MED ORDER — COCONUT OIL OIL: 1.0000 "application " | TOPICAL_OIL | Status: DC | PRN

## 2017-05-30 MED ORDER — OXYTOCIN 40 UNITS IN LACTATED RINGERS INFUSION - SIMPLE MED
INTRAVENOUS | Status: AC
  Filled 2017-05-30: qty 1000

## 2017-05-30 MED ORDER — ONDANSETRON HCL 4 MG/2ML IJ SOLN: 4.0000 mg | Freq: Four times a day (QID) | INTRAMUSCULAR | Status: DC | PRN

## 2017-05-30 MED ORDER — LACTATED RINGERS IV SOLN
500.0000 mL | INTRAVENOUS | Status: DC | PRN
  Administered 2017-05-30: 500 mL via INTRAVENOUS

## 2017-05-30 MED ORDER — FENTANYL 2.5 MCG/ML BUPIVACAINE 1/10 % EPIDURAL INFUSION (WH - ANES)
14.0000 mL/h | INTRAMUSCULAR | Status: DC | PRN
  Administered 2017-05-30: 14 mL/h via EPIDURAL
  Filled 2017-05-30: qty 100

## 2017-05-30 MED ORDER — FENTANYL CITRATE (PF) 100 MCG/2ML IJ SOLN: 50.0000 ug | Freq: Once | INTRAMUSCULAR | Status: AC

## 2017-05-30 MED ORDER — PHENYLEPHRINE 40 MCG/ML (10ML) SYRINGE FOR IV PUSH (FOR BLOOD PRESSURE SUPPORT)
80.0000 ug | PREFILLED_SYRINGE | INTRAVENOUS | Status: DC | PRN
  Filled 2017-05-30: qty 5
  Filled 2017-05-30: qty 10

## 2017-05-30 MED ORDER — ONDANSETRON HCL 4 MG PO TABS: 4.0000 mg | ORAL_TABLET | ORAL | Status: DC | PRN

## 2017-05-30 MED ORDER — ZOLPIDEM TARTRATE 5 MG PO TABS
5.0000 mg | ORAL_TABLET | Freq: Every evening | ORAL | Status: DC | PRN
Start: 2017-05-30 — End: 2017-06-01

## 2017-05-30 MED ORDER — DIPHENHYDRAMINE HCL 25 MG PO CAPS: 25.0000 mg | ORAL_CAPSULE | Freq: Four times a day (QID) | ORAL | Status: DC | PRN

## 2017-05-30 MED ORDER — SODIUM CHLORIDE 0.9 % IV SOLN: Freq: Once | INTRAVENOUS | Status: DC

## 2017-05-30 MED ORDER — DIPHENHYDRAMINE HCL 50 MG/ML IJ SOLN: 12.5000 mg | INTRAMUSCULAR | Status: DC | PRN

## 2017-05-30 MED ORDER — OXYCODONE HCL 5 MG PO TABS
5.0000 mg | ORAL_TABLET | ORAL | Status: DC | PRN
  Administered 2017-05-31: 5 mg via ORAL
  Filled 2017-05-30: qty 1

## 2017-05-30 MED ORDER — TETANUS-DIPHTH-ACELL PERTUSSIS 5-2.5-18.5 LF-MCG/0.5 IM SUSP: 0.5000 mL | Freq: Once | INTRAMUSCULAR | Status: DC

## 2017-05-30 MED ORDER — WITCH HAZEL-GLYCERIN EX PADS: 1.0000 "application " | MEDICATED_PAD | CUTANEOUS | Status: DC | PRN

## 2017-05-30 MED ORDER — MISOPROSTOL 200 MCG PO TABS
ORAL_TABLET | ORAL | Status: AC
  Filled 2017-05-30: qty 5

## 2017-05-30 MED ORDER — FENTANYL CITRATE (PF) 100 MCG/2ML IJ SOLN
50.0000 ug | Freq: Once | INTRAMUSCULAR | Status: AC
  Administered 2017-05-30: 50 ug via INTRAVENOUS

## 2017-05-30 MED ORDER — EPHEDRINE 5 MG/ML INJ
10.0000 mg | INTRAVENOUS | Status: DC | PRN
  Filled 2017-05-30: qty 2

## 2017-05-30 MED ORDER — PRENATAL MULTIVITAMIN CH
1.0000 | ORAL_TABLET | Freq: Every day | ORAL | Status: DC
  Administered 2017-05-31 – 2017-06-01 (×2): 1 via ORAL
  Filled 2017-05-30 (×2): qty 1

## 2017-05-30 MED ORDER — FENTANYL CITRATE (PF) 100 MCG/2ML IJ SOLN
INTRAMUSCULAR | Status: AC
  Administered 2017-05-30: 50 ug via INTRAVENOUS
  Filled 2017-05-30: qty 2

## 2017-05-30 MED ORDER — BENZOCAINE-MENTHOL 20-0.5 % EX AERO
1.0000 "application " | INHALATION_SPRAY | CUTANEOUS | Status: DC | PRN
  Administered 2017-05-31: 1 via TOPICAL
  Filled 2017-05-30: qty 56

## 2017-05-30 MED ORDER — PANTOPRAZOLE SODIUM 20 MG PO TBEC
20.0000 mg | DELAYED_RELEASE_TABLET | Freq: Two times a day (BID) | ORAL | Status: DC
  Administered 2017-05-30: 20 mg via ORAL
  Filled 2017-05-30 (×3): qty 1

## 2017-05-30 MED ORDER — FENTANYL CITRATE (PF) 100 MCG/2ML IJ SOLN
INTRAMUSCULAR | Status: AC
  Filled 2017-05-30: qty 2

## 2017-05-30 MED ORDER — ACETAMINOPHEN 325 MG PO TABS: 650.0000 mg | ORAL_TABLET | ORAL | Status: DC | PRN

## 2017-05-30 MED ORDER — LACTATED RINGERS IV SOLN: 500.0000 mL | Freq: Once | INTRAVENOUS | Status: DC

## 2017-05-30 MED ORDER — SIMETHICONE 80 MG PO CHEW
80.0000 mg | CHEWABLE_TABLET | ORAL | Status: DC | PRN
  Administered 2017-05-31: 80 mg via ORAL
  Filled 2017-05-30: qty 1

## 2017-05-30 MED ORDER — IBUPROFEN 600 MG PO TABS
600.0000 mg | ORAL_TABLET | Freq: Four times a day (QID) | ORAL | Status: DC
  Administered 2017-05-31 – 2017-06-01 (×7): 600 mg via ORAL
  Filled 2017-05-30 (×7): qty 1

## 2017-05-30 MED ORDER — SALINE SPRAY 0.65 % NA SOLN
1.0000 | NASAL | Status: DC | PRN
  Administered 2017-05-30: 1 via NASAL
  Filled 2017-05-30: qty 44

## 2017-05-30 MED ORDER — PHENYLEPHRINE 40 MCG/ML (10ML) SYRINGE FOR IV PUSH (FOR BLOOD PRESSURE SUPPORT)
80.0000 ug | PREFILLED_SYRINGE | INTRAVENOUS | Status: DC | PRN
  Filled 2017-05-30: qty 5

## 2017-05-30 MED ORDER — SENNOSIDES-DOCUSATE SODIUM 8.6-50 MG PO TABS
2.0000 | ORAL_TABLET | ORAL | Status: DC
  Administered 2017-05-31 (×2): 2 via ORAL
  Filled 2017-05-30 (×2): qty 2

## 2017-05-30 MED ORDER — DIBUCAINE 1 % RE OINT: 1.0000 "application " | TOPICAL_OINTMENT | RECTAL | Status: DC | PRN

## 2017-05-30 NOTE — Anesthesia Procedure Notes (Signed)
Epidural Patient location during procedure: OB Start time: 05/30/2017 7:48 PM End time: 05/30/2017 7:51 PM  Staffing Anesthesiologist: Leilani AbleHatchett, Jeziel Hoffmann, MD Performed: anesthesiologist   Preanesthetic Checklist Completed: patient identified, site marked, surgical consent, pre-op evaluation, timeout performed, IV checked, risks and benefits discussed and monitors and equipment checked  Epidural Patient position: sitting Prep: site prepped and draped and DuraPrep Patient monitoring: continuous pulse ox and blood pressure Approach: midline Location: L3-L4 Injection technique: LOR air  Needle:  Needle type: Tuohy  Needle gauge: 17 G Needle length: 9 cm and 9 Needle insertion depth: 5 cm cm Catheter type: closed end flexible Catheter size: 19 Gauge Catheter at skin depth: 10 cm Test dose: negative and Other  Assessment Sensory level: T9 Events: blood not aspirated, injection not painful, no injection resistance, negative IV test and no paresthesia  Additional Notes Reason for block:procedure for pain

## 2017-05-30 NOTE — Progress Notes (Signed)
Anne Whitaker is a 27 y.o. G1P0000 at 5811w2d  Subjective: Called by RN d/t pt feeling the need to push.  She reports back pain and says she can't push even though she feels like she has urge.  Objective: BP 113/70   Pulse 80   Temp 98.6 F (37 C) (Oral)   Resp 18   Ht 5' 2.99" (1.6 m)   Wt 155 lb (70.3 kg)   LMP 12/18/2016   SpO2 94%   BMI 27.46 kg/m  I/O last 3 completed shifts: In: 1954.2 [P.O.:540; I.V.:1414.2] Out: 1700 [Urine:1700] No intake/output data recorded.  FHT:  130s by bedside u/s, difficult to trace baby UC:   Pt reports q 5min SVE:   Dilation: 10 Effacement (%): 80 Station: -3 Exam by:: Anne Whitaker Forebag still present and descending midway down vagina About 100 cc of blood loss with clot bedside u/s footling breech, fluid in forebag in vagina but minimal to no fluid around baby.  Labs: Lab Results  Component Value Date   WBC 8.9 05/30/2017   HGB 9.2 (L) 05/30/2017   HCT 25.7 (L) 05/30/2017   MCV 93.5 05/30/2017   PLT 196 05/30/2017    Assessment / Plan: Expectant Mgmt s/p mg for neuroprotection yesterday.  Pt is having more than expected bloody show.  With an interpreter via the language line Anne Whitaker(Anne Whitaker [phone]#[id number]), I discussed options.  I informed the patient that if her bleeding becomes excessive, she may need a c-section which is less than ideal at this gestational age.  As the pt had requested active mgmt yesterday now that she has leaked more and is having bleeding, I revisited that discussion and the patient declines active mgmt and would prefer to observe and if bleeding becomes risky, she would like c-section.  She is also agreeable to a blood transfusion if needed and is being crossed for 2 units.  Last I check a few minutes ago, the bleeding had minimized.  Labor: observing, no active mgmt Preeclampsia:  no signs or symptoms of toxicity Fetal Wellbeing:  poor prognosis d/t gestational age Pain Control:  IV pain meds.  Anesthesia consulted for  epidural but per report Anne Whitaker was concerned because the pt had been on lovenox.  Last dose >24hrs. I/D:  GBS neg Anticipated MOD:  uncertain  Anne Whitaker 05/30/2017, 12:21 PM

## 2017-05-30 NOTE — Progress Notes (Signed)
Subjective: Had had nose bleed last night.  Small clots noted.  States nose is stuffy.  Denies contractions or pressure.  Denies bloody show.    Objective: BP (!) 107/58   Pulse 61   Temp 98.2 F (36.8 C) (Oral)   Resp 16   Ht 5' 2.99" (1.6 m)   Wt 70.3 kg (155 lb)   LMP 12/18/2016   SpO2 98%   BMI 27.46 kg/m  I/O last 3 completed shifts: In: 1954.2 [P.O.:540; I.V.:1414.2] Out: 3000 [Urine:3000] No intake/output data recorded.  FHT: Last NST reactive.   UC:   none SVE:   Dilation: 4 Effacement (%): 80 Station: -3 Exam by:: Dr. Sallye OberKulwa  Assessment/Plan PPROM but still with bulging membranes admitted with contractions.  Will start zoloft for anxiety and give a dose of nubain now.  Cyclonbenzaprine ordered for prn shoulder spasms/pain. Nose bleed stable.  Will discuss with MD to see about Lovenox and poss CBC.       Kenney HousemanNancy Jean Lucely Leard CNM, MSN 05/30/2017, 6:24 AM

## 2017-05-30 NOTE — Progress Notes (Signed)
Pt called out reporting second nose bleed.  Pt again spitting blood that has drained into throat.  Pt alarmed b/c when she blew her nose 2 small clots came out.  Interpreter # [phone] used to make sure pt understood reasoning for nose bleed & that provider is aware.  Interpreter explained how to use saline nasal spray.  Pt sprayed each side of nose & got a couple more clots out & nose cleared more.  Pt still reports that she feels like nose & head is congested.

## 2017-05-30 NOTE — Progress Notes (Signed)
Dr Su Hiltoberts using interpreter to discuss bleeding and possible need for c/section

## 2017-05-30 NOTE — Progress Notes (Signed)
Pt called out to report nose bleed.  Pt had covered several tissues & was spitting in container d/t drainage in throat.  Pressure held to nose & bleeding stopped.  CNM notified & saline spray for nose ordered

## 2017-05-30 NOTE — Progress Notes (Signed)
Video interpreter [id number] utilized for delivery, postpartum instructions, discussion with NICU provider

## 2017-05-30 NOTE — Anesthesia Preprocedure Evaluation (Signed)
Anesthesia Evaluation  Patient identified by MRN, date of birth, ID band Patient awake    Reviewed: Allergy & Precautions, H&P , NPO status , Patient's Chart, lab work & pertinent test results  Airway Mallampati: I  TM Distance: >3 FB Neck ROM: full    Dental no notable dental hx. (+) Teeth Intact   Pulmonary neg pulmonary ROS,    Pulmonary exam normal breath sounds clear to auscultation       Cardiovascular negative cardio ROS Normal cardiovascular exam Rhythm:regular Rate:Normal     Neuro/Psych negative neurological ROS  negative psych ROS   GI/Hepatic negative GI ROS, Neg liver ROS,   Endo/Other  negative endocrine ROS  Renal/GU negative Renal ROS  negative genitourinary   Musculoskeletal negative musculoskeletal ROS (+)   Abdominal Normal abdominal exam  (+)   Peds  Hematology negative hematology ROS (+)   Anesthesia Other Findings   Reproductive/Obstetrics (+) Pregnancy                             Anesthesia Physical Anesthesia Plan  ASA: II  Anesthesia Plan: Epidural   Post-op Pain Management:    Induction:   PONV Risk Score and Plan:   Airway Management Planned:   Additional Equipment:   Intra-op Plan:   Post-operative Plan:   Informed Consent: I have reviewed the patients History and Physical, chart, labs and discussed the procedure including the risks, benefits and alternatives for the proposed anesthesia with the patient or authorized representative who has indicated his/her understanding and acceptance.       Plan Discussed with:   Anesthesia Plan Comments:         Anesthesia Quick Evaluation  

## 2017-05-30 NOTE — Progress Notes (Signed)
The patient is receiving Protonix by the intravenous route.  Based on criteria approved by the Pharmacy and Therapeutics Committee and the Medical Executive Committee, the medication is being converted to the equivalent oral dose form.  These criteria include: -No active GI bleeding -Able to tolerate diet of full liquids (or better) or tube feeding -Able to tolerate other medications by the oral or enteral route  If you have any questions about this conversion, please contact the Pharmacy Department (phone [phone]).  Thank you.  Anne Whitaker, Anne Whitaker 05/30/2017

## 2017-05-31 LAB — CBC
HEMATOCRIT: 25.5 % — AB (ref 36.0–46.0)
HEMOGLOBIN: 9.1 g/dL — AB (ref 12.0–15.0)
MCH: 33 pg (ref 26.0–34.0)
MCHC: 35.7 g/dL (ref 30.0–36.0)
MCV: 92.4 fL (ref 78.0–100.0)
Platelets: 244 10*3/uL (ref 150–400)
RBC: 2.76 MIL/uL — AB (ref 3.87–5.11)
RDW: 13.7 % (ref 11.5–15.5)
WBC: 10 10*3/uL (ref 4.0–10.5)

## 2017-05-31 NOTE — Anesthesia Postprocedure Evaluation (Signed)
Anesthesia Post Note  Patient: Anne Whitaker  Procedure(s) Performed: AN AD HOC LABOR EPIDURAL     Patient location during evaluation: [facility] Anesthesia Type: Epidural Level of consciousness: awake and alert Pain management: pain level controlled Vital Signs Assessment: post-procedure vital signs reviewed and stable Respiratory status: spontaneous breathing Cardiovascular status: blood pressure returned to baseline Postop Assessment: no headache, epidural receding, patient able to bend at knees and no apparent nausea or vomiting Anesthetic complications: no Comments: Pain score 0.    Last Vitals:  Vitals:   05/31/17 1251 05/31/17 1533  BP: (!) 118/58 100/62  Pulse: 63 64  Resp: 16 18  Temp: 36.6 C 36.9 C  SpO2: 98% 97%    Last Pain:  Vitals:   05/31/17 1533  TempSrc: Oral  PainSc:    Pain Goal: Patients Stated Pain Goal: 4 (05/31/17 0815)               Merrilyn PumaWRINKLE,Kimmi Acocella

## 2017-05-31 NOTE — Lactation Note (Signed)
This note was copied from a baby's chart. Lactation Consultation Note  Patient Name: Anne Whitaker ZOXWR'UToday's Date: 05/31/2017 Reason for consult: Initial assessment;NICU baby;Preterm <34wks;Infant < 6lbs   Baby 18 hours old and in NICU.  7134w2d. Reviewed hand expression with glistening expressed. Assisted w/ DEBP.  Reviewed cleaning, milk storage, labeling and NICU booklet. Mom knows to pump q3h for 15-20 min. Mom made aware of O/P services, breastfeeding support groups, community resources, and our phone # for post-discharge questions.     Maternal Data Has patient been taught Hand Expression?: Yes Does the patient have breastfeeding experience prior to this delivery?: No  Feeding    LATCH Score                   Interventions Interventions: DEBP;Hand express  Lactation Tools Discussed/Used Pump Review: Setup, frequency, and cleaning;Milk Storage Initiated by:: Dahlia Byesuth Allaina Brotzman RN IBCLC Date initiated:: 05/31/17   Consult Status Consult Status: Follow-up Date: 06/01/17 Follow-up type: In-patient    Dahlia ByesBerkelhammer, Hamsa Laurich [facility] 05/31/2017, 2:35 PM

## 2017-05-31 NOTE — Progress Notes (Signed)
Post Partum Day 1 Subjective: no complaints  Objective: Blood pressure (!) 118/58, pulse 63, temperature 97.9 F (36.6 C), temperature source Oral, resp. rate 16, height 5' 2.99" (1.6 m), weight 70.3 kg (155 lb), last menstrual period 12/18/2016, SpO2 98 %, unknown if currently breastfeeding.  Physical Exam:  General: alert, cooperative and no distress Lochia: appropriate Uterine Fundus: firm DVT Evaluation: No evidence of DVT seen on physical exam.  Recent Labs    05/30/17 1255 05/31/17 0516  HGB 10.0* 9.1*  HCT 28.5* 25.5*    Assessment/Plan: PPD # 1 after preterm delivery at 23 w 2 days Baby is stable in the NICU, intubated.  Plan for discharge tomorrow, Lactation consult and Social Work consult -Social work consult due to income and insurance issues.  -Discussed she may stop work for about 3 weeks and return to office for postpartum check and clearance to return to work then.   LOS: 6 days  Anne Whitaker,Anne Broy WAKURU, MD.  05/31/2017, 2:13 PM

## 2017-06-01 LAB — BPAM RBC
BLOOD PRODUCT EXPIRATION DATE: 201905022359
Blood Product Expiration Date: 201905022359
UNIT TYPE AND RH: 5100
Unit Type and Rh: 5100

## 2017-06-01 LAB — TYPE AND SCREEN
ABO/RH(D): O POS
ANTIBODY SCREEN: NEGATIVE
UNIT DIVISION: 0
Unit division: 0

## 2017-06-01 MED ORDER — PRENATAL MULTIVITAMIN CH: 1.0000 | ORAL_TABLET | Freq: Every day | ORAL | 4 refills | Status: DC

## 2017-06-01 MED ORDER — IBUPROFEN 600 MG PO TABS: 600.0000 mg | ORAL_TABLET | Freq: Four times a day (QID) | ORAL | 0 refills | Status: DC

## 2017-06-01 MED ORDER — NORETHINDRONE 0.35 MG PO TABS: 1.0000 | ORAL_TABLET | Freq: Every day | ORAL | 11 refills | Status: DC

## 2017-06-01 MED ORDER — SENNOSIDES-DOCUSATE SODIUM 8.6-50 MG PO TABS: 2.0000 | ORAL_TABLET | ORAL | 1 refills | Status: DC

## 2017-06-01 NOTE — Progress Notes (Signed)
Psychosocial assessment completed and support offered.  Full documentation to follow.  No barriers to discharge.

## 2017-06-01 NOTE — Progress Notes (Signed)
Discharge instructions and printed script given, questions answered, pt states understanding. Signed and given copy

## 2017-06-01 NOTE — Clinical Social Work Maternal (Signed)
CLINICAL SOCIAL WORK MATERNAL/CHILD NOTE  Patient Details  Name: Anne Whitaker MRN: [id number] Date of Birth: 04/18/1990  Date:  06/01/2017  Clinical Social Worker Initiating Note:  Clarion Mooneyhan, LCSW Date/Time: Initiated:  06/01/17/1130     Child's Name:  Anne Whitaker   Biological Parents:  Mother, Father(Anne Whitaker and Anne Whitaker)   Need for Interpreter:  None(Parents speak English, although MOB states their first language is Berber.  She states this is too hard to find interpreting for, so Arabic and French will do.)   Reason for Referral:  Behavioral Health Concerns, Parental Support of Premature Babies < 32 weeks/or Critically Ill babies   Address:  [address] [place] [address]    Phone number:  [phone] (home)     Additional phone number:   Household Members/Support Persons (HM/SP):   Household Member/Support Person 1   HM/SP Name Relationship DOB or Age  HM/SP -1 Anne Benamouche FOB/Spouse    HM/SP -2        HM/SP -3        HM/SP -4        HM/SP -5        HM/SP -6        HM/SP -7        HM/SP -8          Natural Supports (not living in the home):  Friends, Immediate Family, Neighbors, Extended Family(MOB states that their families live in [place], but that they have friends here who are supportive.)   Professional Supports: None   Employment: Full-time   Type of Work: Both parents are currently employed by [employer].  MOB has a degree in Finance.   Education:      Homebound arranged:    Financial Resources:  Private Insurance   Other Resources:      Cultural/Religious Considerations Which May Impact Care: None stated.  Strengths:  Ability to meet basic needs    Psychotropic Medications:         Pediatrician:       Pediatrician List:   [place]    [place]    [place]    [place]    [place]    [place]      Pediatrician Fax Number:    Risk Factors/Current Problems:   Adjustment to Illness    Cognitive State:  Able to Concentrate , Alert , Linear Thinking , Insightful , Goal Oriented    Mood/Affect:  Calm , Interested    CSW Assessment: CSW met with MOB in her third floor room/304 to introduce services, offer support and complete assessment due to baby's admission to NICU at 23.2 weeks.  MOB was alone in her room at this time and stated that this was a good time to talk with her.  She was quiet, but pleasant and easy to engage.  She seemed to appreciate the visit. MOB reports that she feels she is coping well overall.  Her face lit up when she talked about her daughter and she showed CSW a picture of Anne Whitaker on her phone.  She acknowledges that she is critical at this time, but remains hopeful.  CSW encouraged MOB to allow herself to be emotional and embrace every emotion that comes, as she is entitled to these feelings in such a unique experience.  CSW informed her that CSW, Family Support Network and Spiritual Care, along with all of the staff are here to support her and her family is   any way possible and asked that she please not hesitate to reach out if she feels there is something we can do to be of assistance.  MOB thanked CSW.  CSW encouraged MOB to take things day to day and not worry about home preparations or choosing a pediatrician at this time, which were questions that came up throughout the conversation.  CSW stated that we can discuss this as time goes by.  MOB agreed and seemed to have a sense of relief.   MOB had questions about Medicaid and stated that she may not return to work due to baby's extreme prematurity and potential for the need for extra care when she goes home.  CSW explained that the hospital has Financial Counselors and provided her with the phone number for [place] [place] to discuss applying for regular Medicaid.  CSW explained baby's eligibility for Supplemental Security Income (SSI) through the Social Security Administration due to  baby's birth weight and gestational age and explained that this benefit is not based on income while baby is in the hospital.  CSW encouraged her to call during the month of April so that a protective filing date can be held and benefits can be retroactive to birth even though an application cannot be completed until baby is assigned a social security number.  MOB stated understanding, however, CSW acknowledged that this can be confusing, and explained that the benefit is not determined or paid by the hospital, but rather by the Social Security Administration, but  offered to speak with MOB again to answer any questions she may have in the future and to explain benefit to FOB if he would like as well.  CSW explained that the application must be completed with the Social Security Administration, but obtained MOB's signature on a Patient Access form and provided her with a copy of the Admission Note so she can have this as documentation of baby's gestational age and birth weight.  MOB stated understanding and appreciation.   MOB told CSW that she and her husband have been married for approximately 2 years, but have known each other 4-5 years.  She states that their families are still in [place] and that the couple immigrated here 1.5 years ago when MOB "got a lottery because I had a diploma.  My husband got to come with me even though he does not have a diploma."  She states she has a degree in Finance.   CSW provided MOB with contact information, discussed the possibility of holding family conferences as desired, and asked MOB to call CSW any time.  MOB thanked CSW for the visit.    CSW Plan/Description:  No Further Intervention Required/No Barriers to Discharge, Psychosocial Support and Ongoing Assessment of Needs, Perinatal Mood and Anxiety Disorder (PMADs) Education, Supplemental Security Income (SSI) Information, Other Patient/Family Education    Anne Hamza Elizabeth, LCSW 06/01/2017, 3:12 PM  

## 2017-06-01 NOTE — Discharge Summary (Signed)
Obstetric Discharge Summary  Reason for Admission: cervical incompetence and PPROM on 05/25/17 at 22+4 weeks Prenatal Procedures: latency antibiotics and BMX course Intrapartum Procedures: spontaneous vaginal delivery by Dr Su Hiltoberts in footling breech with 3 minute head entrapment on 05/30/17 at 23+2 weeks Postpartum Procedures: none Complications-Operative and Postpartum: none  Hemoglobin  Date Value Ref Range Status  05/31/2017 9.1 (L) 12.0 - 15.0 g/dL Final   HCT  Date Value Ref Range Status  05/31/2017 25.5 (L) 36.0 - 46.0 % Final    Discharge Diagnoses: Incompetent cervix, PROM x5 days, footling breech vaginal delivery  Physical exam:   General: normal Lochia: appropriate Uterine Fundus: 0/2 firm non-tender  Extremities: No evidence of DVT seen on physical exam. Edema none    Date: 06/01/2017 Activity: unrestricted Diet: routine Medications: PNV, Ibuprofen and Colace Condition: stable  Breastfeeding:   Breast pumping Contraception:  micronor reviewed and prescribed   Instructions: refer to practice specific booklet Discharge to: home   Newborn Data:   Baby female Name: Anne Whitaker stable in NICU  Crist FatSandra A Jakell Trusty MD 06/01/2017, 3:41 PM

## 2017-06-01 NOTE — Lactation Note (Signed)
This note was copied from a baby's chart. Lactation Consultation Note  Patient Name: Anne Whitaker ZOXWR'UToday's Date: 06/01/2017 Reason for consult: Follow-up assessment;Preterm <34wks;Infant < 6lbs;NICU baby  Pecola LeisureBaby is 5244 hours old.  Mom is for D/C tonight, Per mom has pumped x3 in the last 24 hours with drops.  LC reviewed supply and demand, and encouraged mom to be consistent pumping at least 8 x's a day.  Engorgement and prevention and tx reviewed.  LC encouraged mom to consider renting a DEBP from [employer] after she is discharged.   LC asked RN to Review with dad.     Maternal Data    Feeding    LATCH Score                   Interventions Interventions: Breast feeding basics reviewed  Lactation Tools Discussed/Used Tools: Pump Breast pump type: Double-Electric Breast Pump Pump Review: Setup, frequency, and cleaning(LC reviewed )   Consult Status Consult Status: PRN Date: 06/01/17    Kathrin GreathouseMargaret Ann Kimila Papaleo 06/01/2017, 4:54 PM

## 2017-06-01 NOTE — Discharge Instructions (Signed)
Postpartum Care After Vaginal Delivery °The period of time right after you deliver your newborn is called the postpartum period. °What kind of medical care will I receive? °· You may continue to receive fluids and medicines through an IV tube inserted into one of your veins. °· If an incision was made near your vagina (episiotomy) or if you had some vaginal tearing during delivery, cold compresses may be placed on your episiotomy or your tear. This helps to reduce pain and swelling. °· You may be given a squirt bottle to use when you go to the bathroom. You may use this until you are comfortable wiping as usual. To use the squirt bottle, follow these steps: °? Before you urinate, fill the squirt bottle with warm water. Do not use hot water. °? After you urinate, while you are sitting on the toilet, use the squirt bottle to rinse the area around your urethra and vaginal opening. This rinses away any urine and blood. °? You may do this instead of wiping. As you start healing, you may use the squirt bottle before wiping yourself. Make sure to wipe gently. °? Fill the squirt bottle with clean water every time you use the bathroom. °· You will be given sanitary pads to wear. °How can I expect to feel? °· You may not feel the need to urinate for several hours after delivery. °· You will have some soreness and pain in your abdomen and vagina. °· If you are breastfeeding, you may have uterine contractions every time you breastfeed for up to several weeks postpartum. Uterine contractions help your uterus return to its normal size. °· It is normal to have vaginal bleeding (lochia) after delivery. The amount and appearance of lochia is often similar to a menstrual period in the first week after delivery. It will gradually decrease over the next few weeks to a dry, yellow-brown discharge. For most women, lochia stops completely by 6-8 weeks after delivery. Vaginal bleeding can vary from woman to woman. °· Within the first few  days after delivery, you may have breast engorgement. This is when your breasts feel heavy, full, and uncomfortable. Your breasts may also throb and feel hard, tightly stretched, warm, and tender. After this occurs, you may have milk leaking from your breasts. Your health care provider can help you relieve discomfort due to breast engorgement. Breast engorgement should go away within a few days. °· You may feel more sad or worried than normal due to hormonal changes after delivery. These feelings should not last more than a few days. If these feelings do not go away after several days, speak with your health care provider. °How should I care for myself? °· Tell your health care provider if you have pain or discomfort. °· Drink enough water to keep your urine clear or pale yellow. °· Wash your hands thoroughly with soap and water for at least 20 seconds after changing your sanitary pads, after using the toilet, and before holding or feeding your baby. °· If you are not breastfeeding, avoid touching your breasts a lot. Doing this can make your breasts produce more milk. °· If you become weak or lightheaded, or you feel like you might faint, ask for help before: °? Getting out of bed. °? Showering. °· Change your sanitary pads frequently. Watch for any changes in your flow, such as a sudden increase in volume, a change in color, the passing of large blood clots. If you pass a blood clot from your vagina, save it   to show to your health care provider. Do not flush blood clots down the toilet without having your health care provider look at them. °· Make sure that all your vaccinations are up to date. This can help protect you and your baby from getting certain diseases. You may need to have immunizations done before you leave the hospital. °· If desired, talk with your health care provider about methods of family planning or birth control (contraception). °How can I start bonding with my baby? °Spending as much time as  possible with your baby is very important. During this time, you and your baby can get to know each other and develop a bond. Having your baby stay with you in your room (rooming in) can give you time to get to know your baby. Rooming in can also help you become comfortable caring for your baby. Breastfeeding can also help you bond with your baby. °How can I plan for returning home with my baby? °· Make sure that you have a car seat installed in your vehicle. °? Your car seat should be checked by a certified car seat installer to make sure that it is installed safely. °? Make sure that your baby fits into the car seat safely. °· Ask your health care provider any questions you have about caring for yourself or your baby. Make sure that you are able to contact your health care provider with any questions after leaving the hospital. °This information is not intended to replace advice given to you by your health care provider. Make sure you discuss any questions you have with your health care provider. °Document Released: 12/08/2006 Document Revised: 07/16/2015 Document Reviewed: 01/15/2015 °Elsevier Interactive Patient Education © 2018 [employer] ° °

## 2017-06-02 ENCOUNTER — Encounter (HOSPITAL_COMMUNITY): Payer: Self-pay | Admitting: *Deleted

## 2017-07-02 ENCOUNTER — Inpatient Hospital Stay (HOSPITAL_COMMUNITY)
Admission: AD | Admit: 2017-07-02 | Discharge: 2017-07-02 | Disposition: A | Discharge status: Home / Self Care | Payer: BLUE CROSS/BLUE SHIELD | Source: Ambulatory Visit | Attending: Obstetrics & Gynecology | Admitting: Obstetrics & Gynecology

## 2017-07-02 ENCOUNTER — Encounter (HOSPITAL_COMMUNITY): Payer: Self-pay

## 2017-07-02 DIAGNOSIS — F410 Panic disorder [episodic paroxysmal anxiety] without agoraphobia: Secondary | ICD-10-CM

## 2017-07-02 DIAGNOSIS — N939 Abnormal uterine and vaginal bleeding, unspecified: Secondary | ICD-10-CM | POA: Diagnosis not present

## 2017-07-02 DIAGNOSIS — F53 Postpartum depression: Secondary | ICD-10-CM

## 2017-07-02 DIAGNOSIS — O99345 Other mental disorders complicating the puerperium: Secondary | ICD-10-CM | POA: Insufficient documentation

## 2017-07-02 DIAGNOSIS — F430 Acute stress reaction: Secondary | ICD-10-CM

## 2017-07-02 LAB — COMPREHENSIVE METABOLIC PANEL
ALK PHOS: 62 U/L (ref 38–126)
ALT: 14 U/L (ref 14–54)
ANION GAP: 11 (ref 5–15)
AST: 18 U/L (ref 15–41)
Albumin: 4.2 g/dL (ref 3.5–5.0)
BILIRUBIN TOTAL: 0.4 mg/dL (ref 0.3–1.2)
BUN: 11 mg/dL (ref 6–20)
CO2: 24 mmol/L (ref 22–32)
Calcium: 9.5 mg/dL (ref 8.9–10.3)
Chloride: 105 mmol/L (ref 101–111)
Creatinine, Ser: 0.48 mg/dL (ref 0.44–1.00)
Glucose, Bld: 93 mg/dL (ref 65–99)
POTASSIUM: 3.8 mmol/L (ref 3.5–5.1)
Sodium: 140 mmol/L (ref 135–145)
TOTAL PROTEIN: 6.9 g/dL (ref 6.5–8.1)

## 2017-07-02 LAB — URINALYSIS, ROUTINE W REFLEX MICROSCOPIC

## 2017-07-02 LAB — URINALYSIS, MICROSCOPIC (REFLEX): RBC / HPF: >50 RBC/hpf (ref 0–5)

## 2017-07-02 LAB — POCT PREGNANCY, URINE: PREG TEST UR: NEGATIVE

## 2017-07-02 LAB — CBC
HCT: 40 % (ref 36.0–46.0)
HEMOGLOBIN: 13.2 g/dL (ref 12.0–15.0)
MCH: 32 pg (ref 26.0–34.0)
MCHC: 33 g/dL (ref 30.0–36.0)
MCV: 97.1 fL (ref 78.0–100.0)
PLATELETS: 276 10*3/uL (ref 150–400)
RBC: 4.12 MIL/uL (ref 3.87–5.11)
RDW: 12.5 % (ref 11.5–15.5)
WBC: 9.9 10*3/uL (ref 4.0–10.5)

## 2017-07-02 LAB — TYPE AND SCREEN
ABO/RH(D): O POS
ANTIBODY SCREEN: NEGATIVE

## 2017-07-02 MED ORDER — LORAZEPAM 1 MG PO TABS
0.5000 mg | ORAL_TABLET | Freq: Once | ORAL | Status: AC
  Administered 2017-07-02: 0.5 mg via ORAL

## 2017-07-02 MED ORDER — SERTRALINE HCL 50 MG PO TABS: 50.0000 mg | ORAL_TABLET | Freq: Every day | ORAL | 2 refills | Status: DC

## 2017-07-02 MED ORDER — LORAZEPAM 1 MG PO TABS
0.5000 mg | ORAL_TABLET | ORAL | Status: DC | PRN
  Filled 2017-07-02: qty 1

## 2017-07-02 MED ORDER — LORAZEPAM 2 MG/ML IJ SOLN: 0.5000 mg | Freq: Once | INTRAMUSCULAR | Status: DC

## 2017-07-02 MED ORDER — LACTATED RINGERS IV BOLUS
1000.0000 mL | Freq: Once | INTRAVENOUS | Status: AC
  Administered 2017-07-02: 1000 mL via INTRAVENOUS

## 2017-07-02 NOTE — MAU Provider Note (Signed)
Chief Complaint: Vaginal Bleeding and Emesis   None    SUBJECTIVE HPI: Anne Whitaker is a 27 y.o. G1P0101 at Unknown who presents to [facility] reporting bleeding for two days.  States its been heavy and is having some vomiting as well.  Had a SVD of a 23 week infant 05/30/2017.  Baby currently in NICU not doing well.  States not sleeping and feeling overwhelmed  Had anxiety prior to birth but getting worse.  Denies wanting to harm herself.  Location: vaginal bleeding Quality: mod Severity: range 4-8/10 on pain scale Duration: 2 days Modifying factors: Not tried anything. Associated signs and symptoms: feeling weak and racing heart with shaking at times.  Past Medical History:  Diagnosis Date  . Left shoulder pain   . Medical history non-contributory    OB History  Gravida Para Term Preterm AB Living  1 1 0 1 0 1  SAB TAB Ectopic Multiple Live Births  0 0 0 0 1    # Outcome Date GA Lbr Len/2nd Weight Sex Delivery Anes PTL Lv  1 Preterm 05/30/17 [redacted]w[redacted]d / 09:40 0.56 kg (1 lb 3.8 oz) F Vag-Breech EPI  LIV   Past Surgical History:  Procedure Laterality Date  . NO PAST SURGERIES     Social History   Socioeconomic History  . Marital status: Married    Spouse name: Not on file  . Number of children: Not on file  . Years of education: Not on file  . Highest education level: Not on file  Occupational History  . Not on file  Social Needs  . Financial resource strain: Not on file  . Food insecurity:    Worry: Not on file    Inability: Not on file  . Transportation needs:    Medical: Not on file    Non-medical: Not on file  Tobacco Use  . Smoking status: Never Smoker  . Smokeless tobacco: Never Used  Substance and Sexual Activity  . Alcohol use: No    Frequency: Never  . Drug use: No  . Sexual activity: Not Currently  Lifestyle  . Physical activity:    Days per week: Not on file    Minutes per session: Not on file  . Stress: Not on file  Relationships  .  Social connections:    Talks on phone: Not on file    Gets together: Not on file    Attends religious service: Not on file    Active member of club or organization: Not on file    Attends meetings of clubs or organizations: Not on file    Relationship status: Not on file  . Intimate partner violence:    Fear of current or ex partner: Not on file    Emotionally abused: Not on file    Physically abused: Not on file    Forced sexual activity: Not on file  Other Topics Concern  . Not on file  Social History Narrative  . Not on file   History reviewed. No pertinent family history. No current facility-administered medications on file prior to encounter.    Current Outpatient Medications on File Prior to Encounter  Medication Sig Dispense Refill  . ibuprofen (ADVIL,MOTRIN) 600 MG tablet Take 1 tablet (600 mg total) by mouth every 6 (six) hours. 30 tablet 0  . norethindrone (ORTHO MICRONOR) 0.35 MG tablet Take 1 tablet (0.35 mg total) by mouth daily. 1 Package 11  . Prenatal Vit-Fe Fumarate-FA (PRENATAL MULTIVITAMIN) TABS tablet Take 1  tablet by mouth daily at 12 noon. 90 tablet 4  . senna-docusate (SENOKOT-S) 8.6-50 MG tablet Take 2 tablets by mouth daily. 60 tablet 1   No Known Allergies  I have reviewed patient's Past Medical Hx, Surgical Hx, Family Hx, Social Hx, medications and allergies.   Review of Systems  Constitutional: Positive for fatigue.  HENT: Negative.   Eyes: Negative.   Respiratory: Negative.   Cardiovascular: Negative.   Gastrointestinal: Positive for vomiting.  Endocrine: Negative.   Genitourinary: Positive for vaginal bleeding.  Musculoskeletal: Negative.   Allergic/Immunologic: Negative.   Neurological: Negative.   Hematological: Negative.   Psychiatric/Behavioral: Positive for sleep disturbance. The patient is nervous/anxious.     OBJECTIVE Patient Vitals for the past 24 hrs:  BP Temp Temp src Pulse Resp SpO2 Height Weight  07/02/17 2014 (!) 142/75 - -  76 - - - -  07/02/17 1854 (!) 106/55 98.3 F (36.8 C) Oral 64 18 100 %  (1.6 m) 69.4 kg (153 lb)   Constitutional: Well-developed, well-nourished female in  acute distress.  Cardiovascular: tachy rate Respiratory: normal rate and effort.  GI: Abd soft, non-tender,   MS: Extremities nontender, no edema, normal ROM Neurologic: Alert and oriented x 4.  GU: Neg CVAT.  SPECULUM EXAM: NEFG, mod blood noted, cervix clean  BIMANUAL: deferred due to patient having a panic attack.   LAB RESULTS Results for orders placed or performed during the hospital encounter of 07/02/17 (from the past 24 hour(s))  Urinalysis, Routine w reflex microscopic     Status: Abnormal   Collection Time: 07/02/17  6:55 PM  Result Value Ref Range   Color, Urine RED (A) YELLOW   APPearance TURBID (A) CLEAR   Specific Gravity, Urine  1.005 - 1.030    TEST NOT REPORTED DUE TO COLOR INTERFERENCE OF URINE PIGMENT   pH  5.0 - 8.0    TEST NOT REPORTED DUE TO COLOR INTERFERENCE OF URINE PIGMENT   Glucose, UA (A) NEGATIVE mg/dL    TEST NOT REPORTED DUE TO COLOR INTERFERENCE OF URINE PIGMENT   Hgb urine dipstick (A) NEGATIVE    TEST NOT REPORTED DUE TO COLOR INTERFERENCE OF URINE PIGMENT   Bilirubin Urine (A) NEGATIVE    TEST NOT REPORTED DUE TO COLOR INTERFERENCE OF URINE PIGMENT   Ketones, ur (A) NEGATIVE mg/dL    TEST NOT REPORTED DUE TO COLOR INTERFERENCE OF URINE PIGMENT   Protein, ur (A) NEGATIVE mg/dL    TEST NOT REPORTED DUE TO COLOR INTERFERENCE OF URINE PIGMENT   Nitrite (A) NEGATIVE    TEST NOT REPORTED DUE TO COLOR INTERFERENCE OF URINE PIGMENT   Leukocytes, UA (A) NEGATIVE    TEST NOT REPORTED DUE TO COLOR INTERFERENCE OF URINE PIGMENT  Urinalysis, Microscopic (reflex)     Status: Abnormal   Collection Time: 07/02/17  6:55 PM  Result Value Ref Range   RBC / HPF >50 0 - 5 RBC/hpf   WBC, UA 0-5 0 - 5 WBC/hpf   Bacteria, UA RARE (A) NONE SEEN   Squamous Epithelial / LPF 0-5 0 - 5   Urine-Other  MICROSCOPIC EXAM PERFORMED ON UNCONCENTRATED URINE   Pregnancy, urine POC     Status: None   Collection Time: 07/02/17  7:11 PM  Result Value Ref Range   Preg Test, Ur NEGATIVE NEGATIVE  CBC     Status: None   Collection Time: 07/02/17  8:20 PM  Result Value Ref Range   WBC 9.9 4.0 - 10.5  K/uL   RBC 4.12 3.87 - 5.11 MIL/uL   Hemoglobin 13.2 12.0 - 15.0 g/dL   HCT 60.4 54.0 - 98.1 %   MCV 97.1 78.0 - 100.0 fL   MCH 32.0 26.0 - 34.0 pg   MCHC 33.0 30.0 - 36.0 g/dL   RDW 19.1 47.8 - 29.5 %   Platelets 276 150 - 400 K/uL  Type and screen [facility] [place]     Status: None (Preliminary result)   Collection Time: 07/02/17  8:20 PM  Result Value Ref Range   ABO/RH(D) O POS    Antibody Screen PENDING    Sample Expiration      07/05/2017 Performed at [facility], [address] [place], [place] [address]   Comprehensive metabolic panel     Status: None   Collection Time: 07/02/17  8:20 PM  Result Value Ref Range   Sodium 140 135 - 145 mmol/L   Potassium 3.8 3.5 - 5.1 mmol/L   Chloride 105 101 - 111 mmol/L   CO2 24 22 - 32 mmol/L   Glucose, Bld 93 65 - 99 mg/dL   BUN 11 6 - 20 mg/dL   Creatinine, Ser 8.65 0.44 - 1.00 mg/dL   Calcium 9.5 8.9 - 78.4 mg/dL   Total Protein 6.9 6.5 - 8.1 g/dL   Albumin 4.2 3.5 - 5.0 g/dL   AST 18 15 - 41 U/L   ALT 14 14 - 54 U/L   Alkaline Phosphatase 62 38 - 126 U/L   Total Bilirubin 0.4 0.3 - 1.2 mg/dL   GFR calc non Af Amer >60 >60 mL/min   GFR calc Af Amer >60 >60 mL/min   Anion gap 11 5 - 15    IMAGING No results found.  MAU COURSE Orders Placed This Encounter  Procedures  . Urinalysis, Routine w reflex microscopic  . Urinalysis, Microscopic (reflex)  . CBC  . Comprehensive metabolic panel  . Pregnancy, urine POC  . Type and screen [facility] [place]   Meds ordered this encounter  Medications  . lactated ringers bolus 1,000 mL  . DISCONTD: LORazepam (ATIVAN) tablet 0.5 mg  . DISCONTD: LORazepam  (ATIVAN) injection 0.5 mg  . LORazepam (ATIVAN) tablet 0.5 mg    MDM PE and labs done.  Pt had panic attack during speculum exam.  Speculum removed and IV and lab work obtained.  Through interpreter found to be having trouble with depression and anxiety.  EPDS done and score of 20.  Discussed with pt and husband with help of interpreter to start long term medication for depression. Hgb wnl so will defer pelvic. ASSESSMENT Vaginal bleeding Postpartum anxiety and depression Panic attack  PLAN Ativan .  po now.  Will start Zoloft  PO.  Discussed that bleeding normal and will start ocps at visit next week.  Can take ibuprofen for cramping. Discharge home in stable condition.      Kenney Houseman, CNM 07/02/2017  9:06 PM

## 2017-07-02 NOTE — MAU Note (Signed)
Urine sent to lab 

## 2017-07-02 NOTE — MAU Note (Signed)
Pt s/p vaginal delivery on 04/23, stopped bleeding but started again today really heavy. Vomiting

## 2019-11-23 ENCOUNTER — Emergency Department (HOSPITAL_COMMUNITY): Payer: 59

## 2019-11-23 ENCOUNTER — Encounter (HOSPITAL_COMMUNITY): Payer: Self-pay

## 2019-11-23 DIAGNOSIS — R110 Nausea: Secondary | ICD-10-CM | POA: Diagnosis not present

## 2019-11-23 DIAGNOSIS — R079 Chest pain, unspecified: Secondary | ICD-10-CM | POA: Diagnosis present

## 2019-11-23 DIAGNOSIS — M5410 Radiculopathy, site unspecified: Secondary | ICD-10-CM | POA: Insufficient documentation

## 2019-11-23 DIAGNOSIS — R0602 Shortness of breath: Secondary | ICD-10-CM | POA: Insufficient documentation

## 2019-11-23 DIAGNOSIS — R519 Headache, unspecified: Secondary | ICD-10-CM | POA: Diagnosis not present

## 2019-11-23 DIAGNOSIS — M79602 Pain in left arm: Secondary | ICD-10-CM | POA: Insufficient documentation

## 2019-11-23 DIAGNOSIS — Z20822 Contact with and (suspected) exposure to covid-19: Secondary | ICD-10-CM | POA: Diagnosis not present

## 2019-11-23 DIAGNOSIS — M25512 Pain in left shoulder: Secondary | ICD-10-CM | POA: Diagnosis not present

## 2019-11-23 LAB — CBC
HCT: 36.4 % (ref 36.0–46.0)
Hemoglobin: 12.4 g/dL (ref 12.0–15.0)
MCH: 31 pg (ref 26.0–34.0)
MCHC: 34.1 g/dL (ref 30.0–36.0)
MCV: 91 fL (ref 80.0–100.0)
Platelets: 275 10*3/uL (ref 150–400)
RBC: 4 MIL/uL (ref 3.87–5.11)
RDW: 12.8 % (ref 11.5–15.5)
WBC: 7.5 10*3/uL (ref 4.0–10.5)
nRBC: 0 % (ref 0.0–0.2)

## 2019-11-23 LAB — BASIC METABOLIC PANEL
Anion gap: 9 (ref 5–15)
BUN: 8 mg/dL (ref 6–20)
CO2: 26 mmol/L (ref 22–32)
Calcium: 8.9 mg/dL (ref 8.9–10.3)
Chloride: 103 mmol/L (ref 98–111)
Creatinine, Ser: 0.61 mg/dL (ref 0.44–1.00)
GFR calc Af Amer: >60 mL/min (ref >60)
GFR calc non Af Amer: >60 mL/min (ref >60)
Glucose, Bld: 130 mg/dL — ABNORMAL HIGH (ref 70–99)
Potassium: 3.5 mmol/L (ref 3.5–5.1)
Sodium: 138 mmol/L (ref 135–145)

## 2019-11-23 LAB — I-STAT BETA HCG BLOOD, ED (MC, WL, AP ONLY): I-stat hCG, quantitative: <5 m[IU]/mL (ref <5)

## 2019-11-23 LAB — TROPONIN I (HIGH SENSITIVITY): Troponin I (High Sensitivity): <2 ng/L (ref <18)

## 2019-11-23 NOTE — ED Triage Notes (Addendum)
C/o left chest pain 6/10 that radiates to left shoulder and down left arm to hand And shob X3 days.  Patient did tele call with PCP and told to come to ED.   A/ox4 Ambulatory in triage Pt not in distress at this time.

## 2019-11-24 ENCOUNTER — Emergency Department (HOSPITAL_BASED_OUTPATIENT_CLINIC_OR_DEPARTMENT_OTHER): Payer: 59

## 2019-11-24 ENCOUNTER — Other Ambulatory Visit: Payer: Self-pay

## 2019-11-24 ENCOUNTER — Emergency Department (HOSPITAL_COMMUNITY)
Admission: EM | Admit: 2019-11-24 | Discharge: 2019-11-24 | Disposition: A | Discharge status: Home / Self Care | Payer: 59 | Attending: Emergency Medicine | Admitting: Emergency Medicine

## 2019-11-24 ENCOUNTER — Encounter (HOSPITAL_COMMUNITY): Payer: Self-pay

## 2019-11-24 ENCOUNTER — Emergency Department (HOSPITAL_COMMUNITY): Payer: 59

## 2019-11-24 DIAGNOSIS — R0789 Other chest pain: Secondary | ICD-10-CM

## 2019-11-24 DIAGNOSIS — M7989 Other specified soft tissue disorders: Secondary | ICD-10-CM

## 2019-11-24 DIAGNOSIS — M792 Neuralgia and neuritis, unspecified: Secondary | ICD-10-CM

## 2019-11-24 DIAGNOSIS — M79609 Pain in unspecified limb: Secondary | ICD-10-CM

## 2019-11-24 DIAGNOSIS — R0602 Shortness of breath: Secondary | ICD-10-CM

## 2019-11-24 LAB — RESPIRATORY PANEL BY RT PCR (FLU A&B, COVID)
Influenza A by PCR: NEGATIVE
Influenza B by PCR: NEGATIVE
SARS Coronavirus 2 by RT PCR: NEGATIVE

## 2019-11-24 LAB — TROPONIN I (HIGH SENSITIVITY): Troponin I (High Sensitivity): 2 ng/L (ref <18)

## 2019-11-24 MED ORDER — IOHEXOL 350 MG/ML SOLN
100.0000 mL | Freq: Once | INTRAVENOUS | Status: AC | PRN
  Administered 2019-11-24: 100 mL via INTRAVENOUS

## 2019-11-24 MED ORDER — OXYCODONE-ACETAMINOPHEN 5-325 MG PO TABS
1.0000 | ORAL_TABLET | Freq: Once | ORAL | Status: AC
  Administered 2019-11-24: 1 via ORAL
  Filled 2019-11-24: qty 1

## 2019-11-24 MED ORDER — MORPHINE SULFATE (PF) 4 MG/ML IV SOLN
4.0000 mg | Freq: Once | INTRAVENOUS | Status: DC
Start: 2019-11-24 — End: 2019-11-24

## 2019-11-24 MED ORDER — ONDANSETRON HCL 4 MG/2ML IJ SOLN: 4.0000 mg | Freq: Once | INTRAMUSCULAR | Status: DC

## 2019-11-24 MED ORDER — ONDANSETRON 4 MG PO TBDP
4.0000 mg | ORAL_TABLET | Freq: Once | ORAL | Status: AC
  Administered 2019-11-24: 4 mg via ORAL
  Filled 2019-11-24: qty 1

## 2019-11-24 NOTE — ED Provider Notes (Signed)
Patient was received at hand off from [facility]. She provided HPI, current work up and likely disposition Please see her note for full detail.  In short patient presents to the emergency department with chief complaint central chest pain with shortness of breath on exertion as well as left shoulder pain.  Patient explains the central chest pain and shortness of breath started 4 days ago..  Patient is currently on birth control and has also endorsed left lower leg pain and additional swelling.  No history of DVTs or PEs.  She also endorses that her left shoulder has been hurting. She has had cortisone shots in the past to help with her left shoulder pain.    Pending lab work respiratory panel, second troponin, CT angio chest as well as lower extremity venous ultrasound.  Physical Exam  BP 114/73 (BP Location: Left Arm)   Pulse (!) 57   Temp 99.1 F (37.3 C) (Oral)   Resp 16   LMP 11/09/2019   SpO2 100%   Physical Exam Vitals and nursing note reviewed.  Constitutional:      General: She is not in acute distress.    Appearance: She is not ill-appearing.  HENT:     Head: Normocephalic and atraumatic.     Nose: No congestion.     Mouth/Throat:     Mouth: Mucous membranes are moist.     Pharynx: Oropharynx is clear.  Eyes:     General: No scleral icterus. Cardiovascular:     Rate and Rhythm: Normal rate and regular rhythm.     Pulses: Normal pulses.     Heart sounds: No murmur heard.  No friction rub. No gallop.   Pulmonary:     Effort: No respiratory distress.     Breath sounds: No wheezing, rhonchi or rales.  Abdominal:     General: There is no distension.     Palpations: Abdomen is soft.     Tenderness: There is no abdominal tenderness. There is no guarding.  Musculoskeletal:        General: No swelling or tenderness.     Right lower leg: No edema.     Left lower leg: No edema.  Skin:    General: Skin is warm and dry.     Findings: No rash.  Neurological:      General: No focal deficit present.     Mental Status: She is alert.  Psychiatric:        Mood and Affect: Mood normal.     ED Course/Procedures     Procedures  MDM  I have personally reviewed all imaging, labs and have interpreted them.  BMP does not show electrolyte abnormalities, no metabolic acidosis, hyperglycemia of 130, no AKI, no high anion gap.  CBC negative for leukocytosis or anemia.  hCG less than 5, delta troponin was negative.  Respiratory panel negative for Covid, influenza a/B. Venous ultrasound negative for DVT.  CT angio negative for PE, there was an incidental finding of atrial vascular enhancement along the portion of the posterior dome of the liver on the right potentially there is an a small vascular malformation of questionable particles of significance.  Chest x-ray did not show any acute abnormalities.  Low suspicion patient suffering from a PE as CT chest does not show any acute abnormalities, low suspicion patient suffering from DVT as ultrasound did not show any acute findings.  Low suspicion patient suffering from ACS as she had a negative delta troponin, EKG does not  show any signs of ischemia.  Low suspicion for systemic infection as patient is nontoxic-appearing, vital signs reassuring, no leukocytosis noted on CBC.    Agree with previous providers evaluation,  possible patient suffering from skeletal muscle strain in her upper left shoulder.  Differential diagnosis for chest pain includes GERD, anxiety, costochondritis, muscular strain.  Recommend patient follows up with her PCP for further evaluation and management.         Carroll Sage, PA-C 11/24/19 1454    Geoffery Lyons, MD 11/25/19 2312

## 2019-11-24 NOTE — ED Notes (Signed)
An After Visit Summary was printed and given to the patient. Discharge instructions given and no further questions at this time.  

## 2019-11-24 NOTE — Progress Notes (Signed)
Left lower extremity venous duplex completed. Refer to "CV Proc" under chart review to view preliminary results.  11/24/2019 12:30 PM Eula Fried., MHA, RVT, RDCS, RDMS

## 2019-11-24 NOTE — Discharge Instructions (Addendum)
Lab work and imaging all look reassuring.  I recommend taking ibuprofen and or Tylenol every 6 as needed for pain.  I also recommend applying heat to your shoulder and stretching of the muscles as this can decrease stiffness and pain.  follow-up with orthopedics for further evaluation of your neck and shoulder pain.  I recommend that you follow-up with your PCP for further evaluation of your chest pain.   contact information for community health and wellness is provided, they help  individuals  find a primary care provider please contact them.  Please come back to emergency department if you develop chest pain, shortness of breath, severe abdominal pain, uncontrolled nausea, vomiting, diarrhea.

## 2019-11-24 NOTE — ED Provider Notes (Signed)
[facility]-EMERGENCY DEPT Provider Note   CSN: [id number] Arrival date & time: 11/23/19  1757     History Chief Complaint  Patient presents with  . Chest Pain  . Shortness of Breath    Anne Whitaker is a 29 y.o. female with no major medical problems presents to the Emergency Department complaining of gradual, persistent, progressively worsening generalized illness onset 4 days ago.  Patient reports she has had central chest pain, left-sided chest pain, left shoulder pain, nausea and headache.  Patient reports her left shoulder pain is severe and radiates up into her left neck and down into her fingers on her left hand.  Denies weakness of the upper extremity or loss of sensation.  Patient reports associated shortness of breath, worse with exertion.  Patient reports she has had left shoulder pain like this in the past which improved with a cortisone shot however she did not have other associated symptoms at that time.  She is not currently vaccinated for Covid.  No known sick contacts.  She does take an oral contraceptive.  She reports some intermittent swelling and pain in the left leg over the last few weeks.  No recent travel.  No history of blood clot.  Patient reports she has been taking acetaminophen with relief of her headache however when the medication wears off her headache returns.  She denies measured fever, cough, neck stiffness, abdominal pain, weakness, dizziness or syncope.  The history is provided by the patient and medical records. No language interpreter was used.       Past Medical History:  Diagnosis Date  . Left shoulder pain   . Medical history non-contributory     Patient Active Problem List   Diagnosis Date Noted  . Preterm premature rupture of membranes (PPROM) with onset of labor after 24 hours of rupture in second trimester, antepartum 05/30/2017  . Cervical incompetence during pregnancy in second trimester 05/25/2017  . Cervical  incompetence affecting management of pregnancy in second trimester, antepartum 05/25/2017    Past Surgical History:  Procedure Laterality Date  . NO PAST SURGERIES       OB History    Gravida  1   Para  1   Term  0   Preterm  1   AB  0   Living  1     SAB  0   TAB  0   Ectopic  0   Multiple  0   Live Births  1           No family history on file.  Social History   Tobacco Use  . Smoking status: Never Smoker  . Smokeless tobacco: Never Used  Substance Use Topics  . Alcohol use: No  . Drug use: No    Home Medications Prior to Admission medications   Medication Sig Start Date End Date Taking? Authorizing Provider  ibuprofen (ADVIL,MOTRIN) 600 MG tablet Take 1 tablet (600 mg total) by mouth every 6 (six) hours. 06/01/17   Silverio Lay, MD  norethindrone (ORTHO MICRONOR) 0.35 MG tablet Take 1 tablet (0.35 mg total) by mouth daily. 06/21/17   Silverio Lay, MD  Prenatal Vit-Fe Fumarate-FA (PRENATAL MULTIVITAMIN) TABS tablet Take 1 tablet by mouth daily at 12 noon. 06/02/17   Silverio Lay, MD  sertraline (ZOLOFT) 50 MG tablet Take 1 tablet (50 mg total) by mouth daily. 07/02/17 07/02/18  Prothero, Henderson Newcomer, CNM    Allergies    Patient has no known allergies.  Review of Systems   Review of Systems  Constitutional: Positive for fatigue. Negative for appetite change, diaphoresis, fever and unexpected weight change.  HENT: Negative for mouth sores.   Eyes: Negative for visual disturbance.  Respiratory: Positive for shortness of breath. Negative for cough, chest tightness and wheezing.   Cardiovascular: Positive for chest pain.  Gastrointestinal: Positive for nausea. Negative for abdominal pain, constipation, diarrhea and vomiting.  Endocrine: Negative for polydipsia, polyphagia and polyuria.  Genitourinary: Negative for dysuria, frequency, hematuria and urgency.  Musculoskeletal: Positive for arthralgias. Negative for back pain and neck stiffness.  Skin:  Negative for rash.  Allergic/Immunologic: Negative for immunocompromised state.  Neurological: Positive for headaches. Negative for syncope and light-headedness.  Hematological: Does not bruise/bleed easily.  Psychiatric/Behavioral: Negative for sleep disturbance. The patient is not nervous/anxious.     Physical Exam Updated Vital Signs BP 114/73 (BP Location: Left Arm)   Pulse (!) 57   Temp 99.1 F (37.3 C) (Oral)   Resp 16   LMP 11/09/2019   SpO2 100%   Physical Exam Vitals and nursing note reviewed.  Constitutional:      General: She is not in acute distress.    Appearance: She is not diaphoretic.  HENT:     Head: Normocephalic.  Eyes:     General: No scleral icterus.    Conjunctiva/sclera: Conjunctivae normal.  Cardiovascular:     Rate and Rhythm: Normal rate and regular rhythm.     Pulses: Normal pulses.          Radial pulses are 2+ on the right side and 2+ on the left side.  Pulmonary:     Effort: No tachypnea, accessory muscle usage, prolonged expiration, respiratory distress or retractions.     Breath sounds: No stridor.     Comments: Equal chest rise. No increased work of breathing. Abdominal:     General: There is no distension.     Palpations: Abdomen is soft.     Tenderness: There is no abdominal tenderness. There is no guarding or rebound.  Musculoskeletal:     Left shoulder: Tenderness present. No deformity.       Arms:     Cervical back: Normal range of motion.     Right lower leg: No tenderness. No edema.     Left lower leg: Tenderness present. Edema (trace) present.     Comments: Moves all extremities equally and without difficulty.  Skin:    General: Skin is warm and dry.     Capillary Refill: Capillary refill takes less than 2 seconds.  Neurological:     Mental Status: She is alert.     GCS: GCS eye subscore is 4. GCS verbal subscore is 5. GCS motor subscore is 6.     Comments: Speech is clear and goal oriented. Sensation intact to normal  touch in the bilateral upper extremities.  Strength 5/5 in the bilateral upper extremities.  Psychiatric:        Mood and Affect: Mood normal.     ED Results / Procedures / Treatments   Labs (all labs ordered are listed, but only abnormal results are displayed) Labs Reviewed  BASIC METABOLIC PANEL - Abnormal; Notable for the following components:      Result Value   Glucose, Bld 130 (*)    All other components within normal limits  RESPIRATORY PANEL BY RT PCR (FLU A&B, COVID)  CBC  I-STAT BETA HCG BLOOD, ED ([facility], [facility], [facility] ONLY)  TROPONIN I (HIGH SENSITIVITY)  TROPONIN  I (HIGH SENSITIVITY)    EKG EKG Interpretation  Date/Time:  Thursday November 24 2019 06:05:50 EDT Ventricular Rate:  59 PR Interval:    QRS Duration: 110 QT Interval:  421 QTC Calculation: 417 R Axis:   24 Text Interpretation: Sinus rhythm Normal ECG When compared with ECG of EARLIER SAME DATE No significant change was found Confirmed by Dione Booze ([id number]) on 11/24/2019 6:12:41 AM   Radiology DG Chest 2 View  Result Date: 11/23/2019 CLINICAL DATA:  Chest pain EXAM: CHEST - 2 VIEW COMPARISON:  None. FINDINGS: The heart size and mediastinal contours are within normal limits. Both lungs are clear. The visualized skeletal structures are unremarkable. IMPRESSION: No active cardiopulmonary disease. Electronically Signed   By: Jasmine Pang M.D.   On: 11/23/2019 19:26    Procedures Procedures (including critical care time)  Medications Ordered in ED Medications  ondansetron (ZOFRAN) injection 4 mg (has no administration in time range)  morphine 4 MG/ML injection 4 mg (has no administration in time range)    ED Course  I have reviewed the triage vital signs and the nursing notes.  Pertinent labs & imaging results that were available during my care of the patient were reviewed by me and considered in my medical decision making (see chart for details).    MDM Rules/Calculators/A&P                           Patient presents with chest pain that radiates into her left shoulder and then down into her left hand.  Additionally with headache, nausea and shortness of breath on exertion.  Patient is unvaccinated for Covid.  Concern for possible Covid versus DVT/PE or cervical radiculopathy.  Given risk factors of oral contraceptives, intermittent swelling of the left leg and calf tenderness on exam we will proceed with venous duplex and CT angiogram.  Pain medication given.  Covid test pending.  6:34 AM At shift change care was transferred to [facility] who will follow pending studies, re-evaulate and determine disposition.     Final Clinical Impression(s) / ED Diagnoses Final diagnoses:  Radicular pain in left arm  Shortness of breath    Rx / DC Orders ED Discharge Orders    None       Ryan Palermo, Boyd Kerbs 11/24/19 4970    Dione Booze, MD 11/24/19 2238

## 2019-11-29 ENCOUNTER — Telehealth: Payer: Self-pay | Admitting: *Deleted

## 2019-11-29 ENCOUNTER — Emergency Department (HOSPITAL_COMMUNITY)
Admission: EM | Admit: 2019-11-29 | Discharge: 2019-11-29 | Disposition: A | Discharge status: Home / Self Care | Payer: 59 | Attending: Emergency Medicine | Admitting: Emergency Medicine

## 2019-11-29 ENCOUNTER — Encounter (HOSPITAL_COMMUNITY): Payer: Self-pay

## 2019-11-29 ENCOUNTER — Other Ambulatory Visit: Payer: Self-pay

## 2019-11-29 DIAGNOSIS — R42 Dizziness and giddiness: Secondary | ICD-10-CM | POA: Insufficient documentation

## 2019-11-29 DIAGNOSIS — R5383 Other fatigue: Secondary | ICD-10-CM | POA: Insufficient documentation

## 2019-11-29 DIAGNOSIS — R112 Nausea with vomiting, unspecified: Secondary | ICD-10-CM | POA: Insufficient documentation

## 2019-11-29 DIAGNOSIS — R109 Unspecified abdominal pain: Secondary | ICD-10-CM | POA: Insufficient documentation

## 2019-11-29 LAB — COMPREHENSIVE METABOLIC PANEL
ALT: 22 U/L (ref 0–44)
AST: 17 U/L (ref 15–41)
Albumin: 4.3 g/dL (ref 3.5–5.0)
Alkaline Phosphatase: 45 U/L (ref 38–126)
Anion gap: 12 (ref 5–15)
BUN: 10 mg/dL (ref 6–20)
CO2: 28 mmol/L (ref 22–32)
Calcium: 9.5 mg/dL (ref 8.9–10.3)
Chloride: 100 mmol/L (ref 98–111)
Creatinine, Ser: 0.69 mg/dL (ref 0.44–1.00)
GFR calc non Af Amer: >60 mL/min (ref >60)
Glucose, Bld: 155 mg/dL — ABNORMAL HIGH (ref 70–99)
Potassium: 3.7 mmol/L (ref 3.5–5.1)
Sodium: 140 mmol/L (ref 135–145)
Total Bilirubin: 0.6 mg/dL (ref 0.3–1.2)
Total Protein: 8 g/dL (ref 6.5–8.1)

## 2019-11-29 LAB — URINALYSIS, ROUTINE W REFLEX MICROSCOPIC
Bacteria, UA: NONE SEEN
Bilirubin Urine: NEGATIVE
Glucose, UA: NEGATIVE mg/dL
Ketones, ur: NEGATIVE mg/dL
Leukocytes,Ua: NEGATIVE
Nitrite: NEGATIVE
Protein, ur: NEGATIVE mg/dL
Specific Gravity, Urine: 1.021 (ref 1.005–1.030)
pH: 9 — ABNORMAL HIGH (ref 5.0–8.0)

## 2019-11-29 LAB — CBC
HCT: 38.5 % (ref 36.0–46.0)
Hemoglobin: 13.4 g/dL (ref 12.0–15.0)
MCH: 31.2 pg (ref 26.0–34.0)
MCHC: 34.8 g/dL (ref 30.0–36.0)
MCV: 89.5 fL (ref 80.0–100.0)
Platelets: 283 10*3/uL (ref 150–400)
RBC: 4.3 MIL/uL (ref 3.87–5.11)
RDW: 12.6 % (ref 11.5–15.5)
WBC: 7.2 10*3/uL (ref 4.0–10.5)
nRBC: 0 % (ref 0.0–0.2)

## 2019-11-29 LAB — I-STAT BETA HCG BLOOD, ED (MC, WL, AP ONLY): I-stat hCG, quantitative: <5 m[IU]/mL (ref <5)

## 2019-11-29 LAB — LIPASE, BLOOD: Lipase: 30 U/L (ref 11–51)

## 2019-11-29 MED ORDER — DICYCLOMINE HCL 20 MG PO TABS: 20.0000 mg | ORAL_TABLET | Freq: Two times a day (BID) | ORAL | 0 refills | Status: DC

## 2019-11-29 MED ORDER — ONDANSETRON 4 MG PO TBDP
4.0000 mg | ORAL_TABLET | Freq: Once | ORAL | Status: AC | PRN
  Administered 2019-11-29: 4 mg via ORAL
  Filled 2019-11-29: qty 1

## 2019-11-29 MED ORDER — PANTOPRAZOLE SODIUM 20 MG PO TBEC: 20.0000 mg | DELAYED_RELEASE_TABLET | Freq: Every day | ORAL | 0 refills | Status: DC

## 2019-11-29 MED ORDER — ONDANSETRON 8 MG PO TBDP: 8.0000 mg | ORAL_TABLET | Freq: Three times a day (TID) | ORAL | 0 refills | Status: DC | PRN

## 2019-11-29 NOTE — ED Triage Notes (Signed)
Patient c/o N/V. Dizziness, and fatigue x 2 days.

## 2019-11-29 NOTE — Discharge Instructions (Signed)
Take the medications as prescribed.  Follow-up with your primary care doctor to be rechecked as we discussed.  Return to the ED for fevers, severe pain

## 2019-11-29 NOTE — ED Provider Notes (Signed)
[facility]-EMERGENCY DEPT Provider Note   CSN: [id number] Arrival date & time: 11/29/19  1312     History Chief Complaint  Patient presents with  . Emesis  . dizzness  . Fatigue  . Headache    Anne Whitaker is a 29 y.o. female.  HPI   Patient states she has been having issues with nausea vomiting for the last several days to week or so.  Patient states it occurs whenever she tries to eat.  She also have a sensation of abdominal fullness and bloating but not necessarily pain.  Patient states she was told in the past she had an issue with her colon.  Patient states it was a "nervous colon".  Patient also has been feeling fatigued and dizzy.  She is not having any issues with numbness or weakness.  No difficulty with chest pain.  No fevers or chills.  No cough or shortness of breath.  No sore throat.  No blood in her stool.  Patient states she has not been vaccinated for Covid but she took a test herself and it was negative.  Patient has been started on medications for anxiety and depression.  Past Medical History:  Diagnosis Date  . Left shoulder pain   . Medical history non-contributory     Patient Active Problem List   Diagnosis Date Noted  . Preterm premature rupture of membranes (PPROM) with onset of labor after 24 hours of rupture in second trimester, antepartum 05/30/2017  . Cervical incompetence during pregnancy in second trimester 05/25/2017  . Cervical incompetence affecting management of pregnancy in second trimester, antepartum 05/25/2017    Past Surgical History:  Procedure Laterality Date  . NO PAST SURGERIES       OB History    Gravida  1   Para  1   Term  0   Preterm  1   AB  0   Living  1     SAB  0   TAB  0   Ectopic  0   Multiple  0   Live Births  1           History reviewed. No pertinent family history.  Social History   Tobacco Use  . Smoking status: Never Smoker  . Smokeless tobacco: Never Used    Vaping Use  . Vaping Use: Never used  Substance Use Topics  . Alcohol use: No  . Drug use: No    Home Medications Prior to Admission medications   Medication Sig Start Date End Date Taking? Authorizing Provider  dicyclomine (BENTYL) 20 MG tablet Take 1 tablet (20 mg total) by mouth 2 (two) times daily. 11/29/19   Linwood Dibbles, MD  ibuprofen (ADVIL,MOTRIN) 600 MG tablet Take 1 tablet (600 mg total) by mouth every 6 (six) hours. Patient not taking: Reported on 11/24/2019 06/01/17   Silverio Lay, MD  norethindrone (ORTHO MICRONOR) 0.35 MG tablet Take 1 tablet (0.35 mg total) by mouth daily. Patient not taking: Reported on 11/24/2019 06/21/17   Silverio Lay, MD  norgestimate-ethinyl estradiol (ESTARYLLA) 0.25-35 MG-MCG tablet Take 1 tablet by mouth daily.    [provider]  ondansetron (ZOFRAN ODT) 8 MG disintegrating tablet Take 1 tablet (8 mg total) by mouth every 8 (eight) hours as needed for nausea or vomiting. 11/29/19   Linwood Dibbles, MD  pantoprazole (PROTONIX) 20 MG tablet Take 1 tablet (20 mg total) by mouth daily. 11/29/19   Linwood Dibbles, MD  Prenatal Vit-Fe Fumarate-FA (PRENATAL  MULTIVITAMIN) TABS tablet Take 1 tablet by mouth daily at 12 noon. Patient not taking: Reported on 11/24/2019 06/02/17   Silverio Lay, MD  sertraline (ZOLOFT) 50 MG tablet Take 1 tablet (50 mg total) by mouth daily. Patient not taking: Reported on 11/24/2019 07/02/17 07/02/18  Prothero, Henderson Newcomer, CNM    Allergies    Patient has no known allergies.  Review of Systems   Review of Systems  All other systems reviewed and are negative.   Physical Exam Updated Vital Signs BP 112/72   Pulse 60   Temp 99.1 F (37.3 C) (Oral)   Resp 18   Ht 1.6 m (5\' 3" )   Wt 73.5 kg   LMP 11/09/2019 Comment: negative beta HCG 11/23/19  SpO2 100%   BMI 28.70 kg/m   Physical Exam Vitals and nursing note reviewed.  Constitutional:      General: She is not in acute distress.    Appearance: She is well-developed.   HENT:     Head: Normocephalic and atraumatic.     Right Ear: External ear normal.     Left Ear: External ear normal.  Eyes:     General: No scleral icterus.       Right eye: No discharge.        Left eye: No discharge.     Conjunctiva/sclera: Conjunctivae normal.  Neck:     Trachea: No tracheal deviation.  Cardiovascular:     Rate and Rhythm: Normal rate and regular rhythm.  Pulmonary:     Effort: Pulmonary effort is normal. No respiratory distress.     Breath sounds: Normal breath sounds. No stridor. No wheezing or rales.  Abdominal:     General: Bowel sounds are normal. There is no distension.     Palpations: Abdomen is soft.     Tenderness: There is no abdominal tenderness. There is no guarding or rebound.  Musculoskeletal:        General: No tenderness.     Cervical back: Neck supple.  Skin:    General: Skin is warm and dry.     Findings: No rash.  Neurological:     Mental Status: She is alert.     Cranial Nerves: No cranial nerve deficit (no facial droop, extraocular movements intact, no slurred speech).     Sensory: No sensory deficit.     Motor: No abnormal muscle tone or seizure activity.     Coordination: Coordination normal.     ED Results / Procedures / Treatments   Labs (all labs ordered are listed, but only abnormal results are displayed) Labs Reviewed  COMPREHENSIVE METABOLIC PANEL - Abnormal; Notable for the following components:      Result Value   Glucose, Bld 155 (*)    All other components within normal limits  URINALYSIS, ROUTINE W REFLEX MICROSCOPIC - Abnormal; Notable for the following components:   APPearance CLOUDY (*)    pH 9.0 (*)    Hgb urine dipstick SMALL (*)    All other components within normal limits  LIPASE, BLOOD  CBC  I-STAT BETA HCG BLOOD, ED ([facility], [facility], [facility] ONLY)    EKG None  Radiology No results found.  Procedures Procedures (including critical care time)  Medications Ordered in ED Medications  ondansetron  (ZOFRAN-ODT) disintegrating tablet 4 mg (4 mg Oral Given 11/29/19 1357)    ED Course  I have reviewed the triage vital signs and the nursing notes.  Pertinent labs & imaging results that were available during my care  of the patient were reviewed by me and considered in my medical decision making (see chart for details).    MDM Rules/Calculators/A&P                          Patient complains of nausea vomiting and fatigue.  Sounds like she was diagnosed with irritable bowel in the past.  Patient's laboratory tests are reassuring.  She does not have any abdominal tenderness.  Her vital signs are normal.  I doubt acute infection, obstruction or other acute surgical process.  Patient is not anemic.  She is not dehydrated.  Lecture lites are normal.  Possible her symptoms may be related to  irritable bowel syndrome.  I will try her on a course of Bentyl Zofran and antacids.  I recommend follow-up with a primary care doctor for further evaluation Final Clinical Impression(s) / ED Diagnoses Final diagnoses:  Nausea and vomiting, intractability of vomiting not specified, unspecified vomiting type  Abdominal cramping    Rx / DC Orders ED Discharge Orders         Ordered    dicyclomine (BENTYL) 20 MG tablet  2 times daily        11/29/19 1717    ondansetron (ZOFRAN ODT) 8 MG disintegrating tablet  Every 8 hours PRN        11/29/19 1717    pantoprazole (PROTONIX) 20 MG tablet  Daily        11/29/19 1717           Linwood Dibbles, MD 11/29/19 1721

## 2019-11-29 NOTE — Telephone Encounter (Signed)
Patient called to get COVID results. Made her aware they were negative. 

## 2019-12-15 ENCOUNTER — Encounter: Payer: Self-pay | Admitting: Physician Assistant

## 2019-12-16 ENCOUNTER — Other Ambulatory Visit: Payer: Self-pay

## 2019-12-16 ENCOUNTER — Encounter: Payer: Self-pay | Admitting: Registered Nurse

## 2019-12-16 ENCOUNTER — Ambulatory Visit (INDEPENDENT_AMBULATORY_CARE_PROVIDER_SITE_OTHER): Payer: 59 | Admitting: Registered Nurse

## 2019-12-16 VITALS — BP 120/76 | HR 68 | Temp 98.5°F | Resp 14 | Ht 63.0 in | Wt 159.8 lb

## 2019-12-16 DIAGNOSIS — R112 Nausea with vomiting, unspecified: Secondary | ICD-10-CM

## 2019-12-16 DIAGNOSIS — M7989 Other specified soft tissue disorders: Secondary | ICD-10-CM

## 2019-12-16 DIAGNOSIS — R202 Paresthesia of skin: Secondary | ICD-10-CM

## 2019-12-16 NOTE — Patient Instructions (Signed)
° ° ° °  If you have lab work done today you will be contacted with your lab results within the next 2 weeks.  If you have not heard from us then please contact us. The fastest way to get your results is to register for My Chart. ° ° °IF you received an x-ray today, you will receive an invoice from [facility]. Please contact [facility] at [phone] with questions or concerns regarding your invoice.  ° °IF you received labwork today, you will receive an invoice from [facility]. Please contact [facility] at [phone] with questions or concerns regarding your invoice.  ° °Our billing staff will not be able to assist you with questions regarding bills from these companies. ° °You will be contacted with the lab results as soon as they are available. The fastest way to get your results is to activate your My Chart account. Instructions are located on the last page of this paperwork. If you have not heard from us regarding the results in 2 weeks, please contact this office. °  ° ° ° °

## 2019-12-16 NOTE — Progress Notes (Signed)
Acute Office Visit  Subjective:    Patient ID: Anne Whitaker, female    DOB: [birth year], 29 y.o.   MRN: [id number]  Chief Complaint  Patient presents with  . Leg Pain    pt reports pain in Lt leg starting a while ago then spread to her Rt leg having swelling in both pt reports intermitent numbness.     HPI Patient is in today for pain and swelling in both legs  Left started first 2-3 weeks ago. Swollen and extremely painful - cannot even drive. Notes that is has felt intermittently hot and very cold - now to the extent that her foot is cold to touch. Within the past 1-2 weeks her right leg also began to swell and become painful and tender. Having a lot of discomfort. No hx of dvt, pe, other clot. Currently taking COCs. Notes that she does have a family hx of SLE in her mother.  Does note some generalized left sided discomfort that has been ongoing for some time - including a little bit of tightness in her chest that has not changed. She had an ekg when she was at ED around 3 weeks ago - wnl. No hx of cv abnormalities to her knowledge.   No other symptoms or concerns at this time. Has not had these symptoms before.  Past Medical History:  Diagnosis Date  . Left shoulder pain   . Medical history non-contributory     Past Surgical History:  Procedure Laterality Date  . NO PAST SURGERIES      History reviewed. No pertinent family history.  Social History   Socioeconomic History  . Marital status: Married    Spouse name: Not on file  . Number of children: 0  . Years of education: Not on file  . Highest education level: Not on file  Occupational History  . Not on file  Tobacco Use  . Smoking status: Never Smoker  . Smokeless tobacco: Never Used  Vaping Use  . Vaping Use: Never used  Substance and Sexual Activity  . Alcohol use: No  . Drug use: No  . Sexual activity: Not Currently  Other Topics Concern  . Not on file  Social History Narrative  . Not on file    Social Determinants of Health   Financial Resource Strain:   . Difficulty of Paying Living Expenses: Not on file  Food Insecurity:   . Worried About Programme researcher, broadcasting/film/video in the Last Year: Not on file  . Ran Out of Food in the Last Year: Not on file  Transportation Needs:   . Lack of Transportation (Medical): Not on file  . Lack of Transportation (Non-Medical): Not on file  Physical Activity:   . Days of Exercise per Week: Not on file  . Minutes of Exercise per Session: Not on file  Stress:   . Feeling of Stress : Not on file  Social Connections:   . Frequency of Communication with Friends and Family: Not on file  . Frequency of Social Gatherings with Friends and Family: Not on file  . Attends Religious Services: Not on file  . Active Member of Clubs or Organizations: Not on file  . Attends Banker Meetings: Not on file  . Marital Status: Not on file  Intimate Partner Violence:   . Fear of Current or Ex-Partner: Not on file  . Emotionally Abused: Not on file  . Physically Abused: Not on file  . Sexually Abused: Not  on file    Outpatient Medications Prior to Visit  Medication Sig Dispense Refill  . dicyclomine (BENTYL) 20 MG tablet Take 1 tablet (20 mg total) by mouth 2 (two) times daily. 20 tablet 0  . FLUoxetine (PROZAC) 10 MG tablet Take 10 mg by mouth daily.    Marland Kitchen ibuprofen (ADVIL,MOTRIN) 600 MG tablet Take 1 tablet (600 mg total) by mouth every 6 (six) hours. 30 tablet 0  . norethindrone (ORTHO MICRONOR) 0.35 MG tablet Take 1 tablet (0.35 mg total) by mouth daily. 1 Package 11  . norgestimate-ethinyl estradiol (ESTARYLLA) 0.25-35 MG-MCG tablet Take 1 tablet by mouth daily.    . ondansetron (ZOFRAN ODT) 8 MG disintegrating tablet Take 1 tablet (8 mg total) by mouth every 8 (eight) hours as needed for nausea or vomiting. 12 tablet 0  . pantoprazole (PROTONIX) 20 MG tablet Take 1 tablet (20 mg total) by mouth daily. 30 tablet 0  . Prenatal Vit-Fe Fumarate-FA  (PRENATAL MULTIVITAMIN) TABS tablet Take 1 tablet by mouth daily at 12 noon. 90 tablet 4  . sertraline (ZOLOFT) 50 MG tablet Take 1 tablet (50 mg total) by mouth daily. (Patient not taking: Reported on 11/24/2019) 30 tablet 2   No facility-administered medications prior to visit.    No Known Allergies  Review of Systems  Constitutional: Negative.   HENT: Negative.   Eyes: Negative.   Respiratory: Positive for chest tightness. Negative for apnea, cough, choking, shortness of breath, wheezing and stridor.   Cardiovascular: Positive for leg swelling. Negative for chest pain and palpitations.  Gastrointestinal: Negative.   Endocrine: Negative.   Genitourinary: Negative.   Musculoskeletal: Positive for arthralgias and myalgias.  Skin: Negative.   Allergic/Immunologic: Negative.   Neurological: Negative.   Hematological: Negative.   Psychiatric/Behavioral: Negative.        Objective:    Physical Exam Vitals and nursing note reviewed.  Cardiovascular:     Rate and Rhythm: Normal rate and regular rhythm.     Pulses: Normal pulses.     Heart sounds: Normal heart sounds.  Pulmonary:     Effort: Pulmonary effort is normal. No respiratory distress.     Breath sounds: Normal breath sounds.  Skin:    General: Skin is warm and dry.  Neurological:     General: No focal deficit present.     Mental Status: She is oriented to person, place, and time. Mental status is at baseline.  Psychiatric:        Mood and Affect: Mood normal.        Behavior: Behavior normal.        Thought Content: Thought content normal.        Judgment: Judgment normal.     BP 120/76   Pulse 68   Temp 98.5 F (36.9 C) (Temporal)   Resp 14   Ht 5\' 3"  (1.6 m)   Wt 159 lb 12.8 oz (72.5 kg)   SpO2 99%   BMI 28.31 kg/m  Wt Readings from Last 3 Encounters:  12/16/19 159 lb 12.8 oz (72.5 kg)  11/29/19 162 lb (73.5 kg)  11/24/19 162 lb (73.5 kg)    There are no preventive care reminders to display for  this patient.  There are no preventive care reminders to display for this patient.   No results found for: TSH Lab Results  Component Value Date   WBC 7.2 11/29/2019   HGB 13.4 11/29/2019   HCT 38.5 11/29/2019   MCV 89.5 11/29/2019   PLT 283  11/29/2019   Lab Results  Component Value Date   NA 140 11/29/2019   K 3.7 11/29/2019   CO2 28 11/29/2019   GLUCOSE 155 (H) 11/29/2019   BUN 10 11/29/2019   CREATININE 0.69 11/29/2019   BILITOT 0.6 11/29/2019   ALKPHOS 45 11/29/2019   AST 17 11/29/2019   ALT 22 11/29/2019   PROT 8.0 11/29/2019   ALBUMIN 4.3 11/29/2019   CALCIUM 9.5 11/29/2019   ANIONGAP 12 11/29/2019   No results found for: CHOL No results found for: HDL No results found for: LDLCALC No results found for: TRIG No results found for: CHOLHDL No results found for: ENID7O     Assessment & Plan:   Problem List Items Addressed This Visit    None    Visit Diagnoses    Right leg swelling    -  Primary   Relevant Orders   ANA w/Reflex   Vitamin D, 25-hydroxy   D-dimer, quantitative (not at [facility])   VAS [place] LOWER EXTREMITY VENOUS (DVT)   Non-intractable vomiting with nausea, unspecified vomiting type       Relevant Orders   Human Chorionic Gonadotropin (hCG),Quantitative (Serial Monitor)   Paresthesia of bilateral legs       Relevant Orders   ANA w/Reflex   Vitamin D, 25-hydroxy   Vitamin B12   CBC With Differential   Hemoglobin A1c       No orders of the defined types were placed in this encounter.  PLAN  A number of concerns are present - aim to first rule out DVT with swelling starting in one leg and spreading to the other. Will order stat US and stat D-dimer to start  Will draw labs including cbc, cmp, hcg, vit d, b12, ANA - given fam hx of SLE I think it is very possible for her to have an autoimmune condition that may place her at greater risk for DVT, particularly with COC use.   Will stop COC use at this time  No intense activity over  weekend. Stay home from work today through Monday or until we can rule out DVT and have a better idea of what is causing these symptoms.   Continue to monitor for changes. Reviewed signs of MI, DVT, PE, CVA, and other concerns in depth - patient understands that should any arise, she should proceed to ED or otherwise seek emergency care immediately.  Will list myself as Pt PCP to follow case - given symptoms and labs, she will warrant further care  Patient encouraged to call clinic with any questions, comments, or concerns.   Janeece Agee, NP

## 2019-12-17 LAB — HUMAN CHORIONIC GONADOTROPIN(HCG),B-SUBUNIT,QUANTITATIVE)

## 2019-12-18 LAB — CBC WITH DIFFERENTIAL
Basophils Absolute: 0.1 10*3/uL (ref 0.0–0.2)
Basos: 1 %
EOS (ABSOLUTE): 0.1 10*3/uL (ref 0.0–0.4)
Eos: 1 %
Hematocrit: 39.6 % (ref 34.0–46.6)
Hemoglobin: 13.3 g/dL (ref 11.1–15.9)
Immature Grans (Abs): 0 10*3/uL (ref 0.0–0.1)
Immature Granulocytes: 0 %
Lymphocytes Absolute: 3.5 10*3/uL — ABNORMAL HIGH (ref 0.7–3.1)
Lymphs: 40 %
MCH: 30.4 pg (ref 26.6–33.0)
MCHC: 33.6 g/dL (ref 31.5–35.7)
MCV: 91 fL (ref 79–97)
Monocytes Absolute: 0.7 10*3/uL (ref 0.1–0.9)
Monocytes: 8 %
Neutrophils Absolute: 4.3 10*3/uL (ref 1.4–7.0)
Neutrophils: 50 %
RBC: 4.37 x10E6/uL (ref 3.77–5.28)
RDW: 12.3 % (ref 11.7–15.4)
WBC: 8.5 10*3/uL (ref 3.4–10.8)

## 2019-12-18 LAB — HEMOGLOBIN A1C
Est. average glucose Bld gHb Est-mCnc: 100 mg/dL
Hgb A1c MFr Bld: 5.1 % (ref 4.8–5.6)

## 2019-12-18 LAB — HUMAN CHORIONIC GONADOTROPIN(HCG),B-SUBUNIT,QUANTITATIVE): HCG, Beta Chain, Quant, S: <1 m[IU]/mL

## 2019-12-18 LAB — ANA W/REFLEX: Anti Nuclear Antibody (ANA): NEGATIVE

## 2019-12-18 LAB — D-DIMER, QUANTITATIVE: D-DIMER: <0.2 mg/L FEU (ref 0.00–0.49)

## 2019-12-18 LAB — VITAMIN D 25 HYDROXY (VIT D DEFICIENCY, FRACTURES): Vit D, 25-Hydroxy: 22.8 ng/mL — ABNORMAL LOW (ref 30.0–100.0)

## 2019-12-18 LAB — VITAMIN B12: Vitamin B-12: 298 pg/mL (ref 232–1245)

## 2019-12-19 NOTE — Progress Notes (Signed)
Good morning,   If we could call patient - her labs are back, no urgent concerns. She should start on a Vitamin D supplement, taking at least 2000 Units daily, for her mild Vitamin D deficiency. Unfortunately no clear cause for her symptoms. Fortunately, D-dimer is back negative, meaning it's not very likely she has a clot. Once we get the ultrasound back, we can plan from there  Thank you  Jari Sportsman, NP

## 2019-12-20 ENCOUNTER — Encounter: Payer: Self-pay | Admitting: Registered Nurse

## 2019-12-20 ENCOUNTER — Ambulatory Visit (INDEPENDENT_AMBULATORY_CARE_PROVIDER_SITE_OTHER): Payer: 59 | Admitting: Registered Nurse

## 2019-12-20 ENCOUNTER — Other Ambulatory Visit (HOSPITAL_COMMUNITY)
Admission: RE | Admit: 2019-12-20 | Discharge: 2019-12-20 | Disposition: A | Discharge status: Home / Self Care | Payer: 59 | Source: Ambulatory Visit | Attending: Registered Nurse | Admitting: Registered Nurse

## 2019-12-20 ENCOUNTER — Other Ambulatory Visit: Payer: Self-pay

## 2019-12-20 VITALS — BP 110/70 | HR 68 | Temp 98.0°F | Resp 18 | Ht 63.0 in | Wt 158.4 lb

## 2019-12-20 DIAGNOSIS — R1114 Bilious vomiting: Secondary | ICD-10-CM

## 2019-12-20 DIAGNOSIS — R310 Gross hematuria: Secondary | ICD-10-CM

## 2019-12-20 DIAGNOSIS — R102 Pelvic and perineal pain: Secondary | ICD-10-CM | POA: Diagnosis present

## 2019-12-20 DIAGNOSIS — K5669 Other partial intestinal obstruction: Secondary | ICD-10-CM | POA: Diagnosis not present

## 2019-12-20 DIAGNOSIS — M549 Dorsalgia, unspecified: Secondary | ICD-10-CM | POA: Diagnosis not present

## 2019-12-20 LAB — POCT URINALYSIS DIP (CLINITEK)
Glucose, UA: NEGATIVE mg/dL
Leukocytes, UA: NEGATIVE
Nitrite, UA: NEGATIVE
POC PROTEIN,UA: 100 — AB
Spec Grav, UA: 1.025 (ref 1.010–1.025)
Urobilinogen, UA: 1 E.U./dL
pH, UA: 5.5 (ref 5.0–8.0)

## 2019-12-20 NOTE — Patient Instructions (Signed)
° ° ° °  If you have lab work done today you will be contacted with your lab results within the next 2 weeks.  If you have not heard from us then please contact us. The fastest way to get your results is to register for My Chart. ° ° °IF you received an x-ray today, you will receive an invoice from [facility]. Please contact [facility] at [phone] with questions or concerns regarding your invoice.  ° °IF you received labwork today, you will receive an invoice from [facility]. Please contact [facility] at [phone] with questions or concerns regarding your invoice.  ° °Our billing staff will not be able to assist you with questions regarding bills from these companies. ° °You will be contacted with the lab results as soon as they are available. The fastest way to get your results is to activate your My Chart account. Instructions are located on the last page of this paperwork. If you have not heard from us regarding the results in 2 weeks, please contact this office. °  ° ° ° °

## 2019-12-20 NOTE — Progress Notes (Signed)
Established Patient Office Visit  Subjective:  Patient ID: Anne Whitaker, female    DOB: 05/31/1990  Age: 29 y.o. MRN: [id number]  CC:  Chief Complaint  Patient presents with   Follow-up    Patient states she is still having some leg pain. Per patient she is also felling dizzy and whole body feel week maybe because she getting a period every 15 days and staying for 9-10 days with heavy bleeding and dark blood.    HPI Anne Whitaker presents for ongoing pain  In further discussions of family history, patient does state that she has had multiple family members (cousins) dx with uterine/ovarian ca in their 72s.  Does note fam hx of colon cancer as well, though unsure of age at dx.  Noting now some c spine pain - may be related to some of the nausea and vomiting she has been experiencing  Upon further review of her chart, we asked about her pregnancy in 2019 - she had cervical insufficiency. Notes that she does not believe she has ever had a pap - denies this today.   Past Medical History:  Diagnosis Date   Left shoulder pain    Medical history non-contributory     Past Surgical History:  Procedure Laterality Date   NO PAST SURGERIES      Family History  Problem Relation Age of Onset   Brain cancer Maternal Grandfather    Stomach cancer Paternal Grandmother    Colon cancer Maternal Aunt    Colon cancer Paternal Aunt     Social History   Socioeconomic History   Marital status: Married    Spouse name: Not on file   Number of children: 0   Years of education: Not on file   Highest education level: Not on file  Occupational History   Not on file  Tobacco Use   Smoking status: Never Smoker   Smokeless tobacco: Never Used  Vaping Use   Vaping Use: Never used  Substance and Sexual Activity   Alcohol use: No   Drug use: No   Sexual activity: Not Currently  Other Topics Concern   Not on file  Social History Narrative   Not on file    Social Determinants of Health   Financial Resource Strain:    Difficulty of Paying Living Expenses: Not on file  Food Insecurity:    Worried About Running Out of Food in the Last Year: Not on file   [employer] of Food in the Last Year: Not on file  Transportation Needs:    Lack of Transportation (Medical): Not on file   Lack of Transportation (Non-Medical): Not on file  Physical Activity:    Days of Exercise per Week: Not on file   Minutes of Exercise per Session: Not on file  Stress:    Feeling of Stress : Not on file  Social Connections:    Frequency of Communication with Friends and Family: Not on file   Frequency of Social Gatherings with Friends and Family: Not on file   Attends Religious Services: Not on file   Active Member of Clubs or Organizations: Not on file   Attends Banker Meetings: Not on file   Marital Status: Not on file  Intimate Partner Violence:    Fear of Current or Ex-Partner: Not on file   Emotionally Abused: Not on file   Physically Abused: Not on file   Sexually Abused: Not on file    Outpatient Medications  Prior to Visit  Medication Sig Dispense Refill   dicyclomine (BENTYL) 10 MG capsule Take 10 mg by mouth 4 (four) times daily -  before meals and at bedtime.     dicyclomine (BENTYL) 20 MG tablet Take 1 tablet (20 mg total) by mouth 2 (two) times daily. 20 tablet 0   FLUoxetine (PROZAC) 10 MG tablet Take 10 mg by mouth daily.     ibuprofen (ADVIL,MOTRIN) 600 MG tablet Take 1 tablet (600 mg total) by mouth every 6 (six) hours. 30 tablet 0   norethindrone (ORTHO MICRONOR) 0.35 MG tablet Take 1 tablet (0.35 mg total) by mouth daily. 1 Package 11   norgestimate-ethinyl estradiol (ESTARYLLA) 0.25-35 MG-MCG tablet Take 1 tablet by mouth daily.     omeprazole (PRILOSEC) 40 MG capsule Take 40 mg by mouth daily.     ondansetron (ZOFRAN ODT) 8 MG disintegrating tablet Take 1 tablet (8 mg total) by mouth every 8 (eight)  hours as needed for nausea or vomiting. 12 tablet 0   pantoprazole (PROTONIX) 20 MG tablet Take 1 tablet (20 mg total) by mouth daily. 30 tablet 0   Prenatal Vit-Fe Fumarate-FA (PRENATAL MULTIVITAMIN) TABS tablet Take 1 tablet by mouth daily at 12 noon. 90 tablet 4   sertraline (ZOLOFT) 50 MG tablet Take 1 tablet (50 mg total) by mouth daily. (Patient not taking: Reported on 11/24/2019) 30 tablet 2   No facility-administered medications prior to visit.    No Known Allergies  ROS Review of Systems Per hpi    Objective:    Physical Exam Vitals and nursing note reviewed.  Constitutional:      General: She is not in acute distress.    Appearance: Normal appearance. She is normal weight. She is not ill-appearing, toxic-appearing or diaphoretic.  Cardiovascular:     Rate and Rhythm: Normal rate and regular rhythm.     Heart sounds: Normal heart sounds. No murmur heard.  No friction rub. No gallop.   Pulmonary:     Effort: Pulmonary effort is normal. No respiratory distress.     Breath sounds: Normal breath sounds. No stridor. No wheezing, rhonchi or rales.  Chest:     Chest wall: No tenderness.  Abdominal:     General: Abdomen is flat. Bowel sounds are normal. There is no distension.     Palpations: There is mass (unable to distinguish d/t guarding.).     Tenderness: There is abdominal tenderness. There is right CVA tenderness, left CVA tenderness and guarding. There is no rebound.     Hernia: No hernia is present.  Musculoskeletal:        General: No swelling or tenderness. Normal range of motion.  Skin:    General: Skin is warm and dry.     Capillary Refill: Capillary refill takes less than 2 seconds.  Neurological:     General: No focal deficit present.     Mental Status: She is alert and oriented to person, place, and time. Mental status is at baseline.  Psychiatric:        Mood and Affect: Mood normal.        Behavior: Behavior normal.        Thought Content: Thought  content normal.        Judgment: Judgment normal.     BP 110/70    Pulse 68    Temp 98 F (36.7 C) (Temporal)    Resp 18    Ht 5\' 3"  (1.6 m)    Wt 158  lb 6.4 oz (71.8 kg)    SpO2 90%    BMI 28.06 kg/m  Wt Readings from Last 3 Encounters:  12/20/19 158 lb 6.4 oz (71.8 kg)  12/16/19 159 lb 12.8 oz (72.5 kg)  11/29/19 162 lb (73.5 kg)     There are no preventive care reminders to display for this patient.  There are no preventive care reminders to display for this patient.  No results found for: TSH Lab Results  Component Value Date   WBC 8.5 12/16/2019   HGB 13.3 12/16/2019   HCT 39.6 12/16/2019   MCV 91 12/16/2019   PLT 283 11/29/2019   Lab Results  Component Value Date   NA 140 11/29/2019   K 3.7 11/29/2019   CO2 28 11/29/2019   GLUCOSE 155 (H) 11/29/2019   BUN 10 11/29/2019   CREATININE 0.69 11/29/2019   BILITOT 0.6 11/29/2019   ALKPHOS 45 11/29/2019   AST 17 11/29/2019   ALT 22 11/29/2019   PROT 8.0 11/29/2019   ALBUMIN 4.3 11/29/2019   CALCIUM 9.5 11/29/2019   ANIONGAP 12 11/29/2019   No results found for: CHOL No results found for: HDL No results found for: LDLCALC No results found for: TRIG No results found for: CHOLHDL Lab Results  Component Value Date   HGBA1C 5.1 12/16/2019      Assessment & Plan:   Problem List Items Addressed This Visit    None    Visit Diagnoses    Costovertebral angle pain    -  Primary   Relevant Orders   POCT URINALYSIS DIP (CLINITEK) (Completed)   Pelvic pain       Relevant Orders   Urine cytology ancillary only   Other partial intestinal obstruction (HCC)       Relevant Orders   CT Abdomen Pelvis Wo Contrast   Bilious vomiting with nausea       Relevant Orders   CT Abdomen Pelvis Wo Contrast   Gross hematuria       Relevant Orders   CT Abdomen Pelvis Wo Contrast      No orders of the defined types were placed in this encounter.   Follow-up: No follow-ups on file.   PLAN  Given ongoing symptoms,  largely reassuring labs at last visit, and other reassuring factors, my largest concern at this time is now for an acute onc process. Unfortunately, cervical cancer can present with this leg pain at advanced stages - unable to find a pap in the past, patient states she does not believe she's had one, firmly declines one today despite acknowledgement of risks and benefits.   Will order stat CT of abdomen and pelvis wwo contrast. Given blood in stool, heavy and frequent menses, and unexplained abdominal pain, suspect an anatomical abnormality or neoplasm will be evident.  Will follow up after imaging. Pt has upcoming visit with GI on Nov 18 - if we do not find any answers with imaging, will continue to investigate. Perhaps endometriosis as differential?  Patient encouraged to call clinic with any questions, comments, or concerns.  Janeece Agee, NP

## 2019-12-21 ENCOUNTER — Encounter (HOSPITAL_COMMUNITY): Payer: Self-pay

## 2019-12-21 ENCOUNTER — Ambulatory Visit (HOSPITAL_COMMUNITY)
Admission: RE | Admit: 2019-12-21 | Discharge: 2019-12-21 | Disposition: A | Discharge status: Home / Self Care | Payer: 59 | Source: Ambulatory Visit | Attending: Registered Nurse | Admitting: Registered Nurse

## 2019-12-21 DIAGNOSIS — R310 Gross hematuria: Secondary | ICD-10-CM

## 2019-12-21 DIAGNOSIS — K5669 Other partial intestinal obstruction: Secondary | ICD-10-CM | POA: Insufficient documentation

## 2019-12-21 DIAGNOSIS — R1114 Bilious vomiting: Secondary | ICD-10-CM | POA: Diagnosis present

## 2019-12-21 NOTE — Progress Notes (Signed)
If we could please call this patient -   CT scan of abdomen and pelvis is normal. We should really get her in for a Pap smear. We can have Macario Carls do this if she is more comfortable with a female provider, but this is our next step to take before she sees GI.  Thank you  Jari Sportsman, NP

## 2019-12-22 ENCOUNTER — Telehealth: Payer: Self-pay | Admitting: Registered Nurse

## 2019-12-22 ENCOUNTER — Ambulatory Visit (HOSPITAL_COMMUNITY)
Admission: RE | Admit: 2019-12-22 | Discharge: 2019-12-22 | Disposition: A | Discharge status: Home / Self Care | Payer: 59 | Source: Ambulatory Visit | Attending: Registered Nurse | Admitting: Registered Nurse

## 2019-12-22 ENCOUNTER — Other Ambulatory Visit: Payer: Self-pay

## 2019-12-22 ENCOUNTER — Other Ambulatory Visit: Payer: Self-pay | Admitting: Registered Nurse

## 2019-12-22 DIAGNOSIS — R102 Pelvic and perineal pain: Secondary | ICD-10-CM

## 2019-12-22 DIAGNOSIS — M7989 Other specified soft tissue disorders: Secondary | ICD-10-CM | POA: Insufficient documentation

## 2019-12-22 LAB — URINE CYTOLOGY ANCILLARY ONLY
Chlamydia: NEGATIVE
Comment: NEGATIVE
Comment: NEGATIVE
Comment: NORMAL
Neisseria Gonorrhea: NEGATIVE
Trichomonas: NEGATIVE

## 2019-12-22 NOTE — Telephone Encounter (Signed)
Ultrasound tech called about pts ultrasound and he stated that the bilateral lower venous duplex came back neg for DVT in both legs. Please advise.

## 2019-12-22 NOTE — Telephone Encounter (Signed)
Pt [place] results unsure if you were aware

## 2019-12-22 NOTE — Progress Notes (Signed)
Bilateral lower extremity venous duplex has been completed. Preliminary results can be found in CV Proc through chart review.  Results were given to Sarah at Sherlyn Lees NP office.  12/22/19 10:08 AM Olen Cordial RVT

## 2019-12-22 NOTE — Progress Notes (Signed)
Fyi - saw that they had called and given you the message - I have seen these results. Thank you  Rich

## 2019-12-22 NOTE — Progress Notes (Signed)
If we could call patient with interpreter-   No clots noted on ultrasound of her legs. I think it's of utmost importance to have a pap given her hx of cervical insufficiency during pregnancy - I will be placing an urgent referral to women's health given her symptoms if she does not feel comfortable having this done here. Still important to follow up with gastroenterology as well.  Thank you  Jari Sportsman, NP

## 2019-12-26 ENCOUNTER — Ambulatory Visit (INDEPENDENT_AMBULATORY_CARE_PROVIDER_SITE_OTHER): Payer: 59 | Admitting: Family Medicine

## 2019-12-26 ENCOUNTER — Other Ambulatory Visit: Payer: Self-pay

## 2019-12-26 ENCOUNTER — Other Ambulatory Visit (HOSPITAL_COMMUNITY)
Admission: RE | Admit: 2019-12-26 | Discharge: 2019-12-26 | Disposition: A | Discharge status: Home / Self Care | Payer: 59 | Source: Ambulatory Visit | Attending: Family Medicine | Admitting: Family Medicine

## 2019-12-26 ENCOUNTER — Encounter: Payer: Self-pay | Admitting: Family Medicine

## 2019-12-26 ENCOUNTER — Telehealth: Payer: Self-pay | Admitting: Registered Nurse

## 2019-12-26 VITALS — BP 109/72 | HR 71 | Temp 98.0°F | Ht 63.0 in | Wt 160.0 lb

## 2019-12-26 DIAGNOSIS — R102 Pelvic and perineal pain: Secondary | ICD-10-CM | POA: Diagnosis present

## 2019-12-26 DIAGNOSIS — Z3041 Encounter for surveillance of contraceptive pills: Secondary | ICD-10-CM | POA: Diagnosis not present

## 2019-12-26 DIAGNOSIS — F32A Depression, unspecified: Secondary | ICD-10-CM | POA: Diagnosis not present

## 2019-12-26 MED ORDER — FLUOXETINE HCL 10 MG PO TABS: 10.0000 mg | ORAL_TABLET | Freq: Every day | ORAL | 3 refills | Status: DC

## 2019-12-26 MED ORDER — NORGESTIMATE-ETH ESTRADIOL 0.25-35 MG-MCG PO TABS: 1.0000 | ORAL_TABLET | Freq: Every day | ORAL | 3 refills | Status: DC

## 2019-12-26 NOTE — Patient Instructions (Addendum)
  Will follow up with lab results  If you have lab work done today you will be contacted with your lab results within the next 2 weeks.  If you have not heard from [place] then please contact us. The fastest way to get your results is to register for My Chart.   IF you received an x-ray today, you will receive an invoice from [facility]. Please contact [facility] at [phone] with questions or concerns regarding your invoice.   IF you received labwork today, you will receive an invoice from [facility]. Please contact [facility] at [phone] with questions or concerns regarding your invoice.   Our billing staff will not be able to assist you with questions regarding bills from these companies.  You will be contacted with the lab results as soon as they are available. The fastest way to get your results is to activate your My Chart account. Instructions are located on the last page of this paperwork. If you have not heard from [place] regarding the results in 2 weeks, please contact this office.      Pelvic Pain, Female Pelvic pain is pain in your lower belly (abdomen), below your belly button and between your hips. The pain may start suddenly (be acute), keep coming back (be recurring), or last a long time (become chronic). Pelvic pain that lasts longer than 6 months is called chronic pelvic pain. There are many causes of pelvic pain. Sometimes the cause of pelvic pain is not known. Follow these instructions at home:   Take over-the-counter and prescription medicines only as told by your doctor.  Rest as told by your doctor.  Do not have sex if it hurts.  Keep a journal of your pelvic pain. Write down: ? When the pain started. ? Where the pain is located. ? What seems to make the pain better or worse, such as food or your period (menstrual cycle). ? Any symptoms you have along with the pain.  Keep all follow-up visits as told by your doctor. This is important. Contact a  doctor if:  Medicine does not help your pain.  Your pain comes back.  You have new symptoms.  You have unusual discharge or bleeding from your vagina.  You have a fever or chills.  You are having trouble pooping (constipation).  You have blood in your pee (urine) or poop (stool).  Your pee smells bad.  You feel weak or light-headed. Get help right away if:  You have sudden pain that is very bad.  Your pain keeps getting worse.  You have very bad pain and also have any of these symptoms: ? A fever. ? Feeling sick to your stomach (nausea). ? Throwing up (vomiting). ? Being very sweaty.  You pass out (lose consciousness). Summary  Pelvic pain is pain in your lower belly (abdomen), below your belly button and between your hips.  There are many possible causes of pelvic pain.  Keep a journal of your pelvic pain. This information is not intended to replace advice given to you by your health care provider. Make sure you discuss any questions you have with your health care provider. Document Revised: 07/29/2017 Document Reviewed: 07/29/2017 Elsevier Patient Education  2020 [employer].

## 2019-12-26 NOTE — Telephone Encounter (Signed)
PT STATES SHE MISSED A CALL AT 3:30 PM FROM OUR OFFICE

## 2019-12-26 NOTE — Progress Notes (Signed)
Established Patient Office Visit  Subjective:  Patient ID: Anne Whitaker, female    DOB: Jan 07, 1991  Age: 29 y.o. MRN: [id number]  CC:  Chief Complaint  Patient presents with  . L low back and leg    dizzyness, nausea, vomiting- x 1 month - stopped BCP 2 weeks ago due to 2 periods per month  . Gynecologic Exam    HPI Nike Nait Rabah presents for PaP.  Per Kateri Plummer NP: Strong family history of cancer. Has been having issues with nausea, abdominal pain, back pain, and fatigue. Unsure when her last pap was.  She states one month of lower abdominal pain, lower back pain, bilateral lower leg pain and fatigue  Period came 2 times this past month and lasted for 10 days Heavy Bleeding, painful which is not her normal She normally has periods once a month lasting for 3-4 days Not currently bleeding 1 Pregnancy 2019 complicated by cervical insufficiency. Denies possibility of pregnancy, negative 12/16/2019 Previous Abortion 2020: Pills   Past Medical History:  Diagnosis Date  . Left shoulder pain   . Medical history non-contributory     Past Surgical History:  Procedure Laterality Date  . NO PAST SURGERIES      Family History  Problem Relation Age of Onset  . Brain cancer Maternal Grandfather   . Stomach cancer Paternal Grandmother   . Colon cancer Maternal Aunt   . Colon cancer Paternal Aunt     Social History   Socioeconomic History  . Marital status: Married    Spouse name: Not on file  . Number of children: 0  . Years of education: Not on file  . Highest education level: Not on file  Occupational History  . Not on file  Tobacco Use  . Smoking status: Never Smoker  . Smokeless tobacco: Never Used  Vaping Use  . Vaping Use: Never used  Substance and Sexual Activity  . Alcohol use: No  . Drug use: No  . Sexual activity: Not Currently  Other Topics Concern  . Not on file  Social History Narrative  . Not on file   Social Determinants of Health    Financial Resource Strain:   . Difficulty of Paying Living Expenses: Not on file  Food Insecurity:   . Worried About Programme researcher, broadcasting/film/video in the Last Year: Not on file  . Ran Out of Food in the Last Year: Not on file  Transportation Needs:   . Lack of Transportation (Medical): Not on file  . Lack of Transportation (Non-Medical): Not on file  Physical Activity:   . Days of Exercise per Week: Not on file  . Minutes of Exercise per Session: Not on file  Stress:   . Feeling of Stress : Not on file  Social Connections:   . Frequency of Communication with Friends and Family: Not on file  . Frequency of Social Gatherings with Friends and Family: Not on file  . Attends Religious Services: Not on file  . Active Member of Clubs or Organizations: Not on file  . Attends Banker Meetings: Not on file  . Marital Status: Not on file  Intimate Partner Violence:   . Fear of Current or Ex-Partner: Not on file  . Emotionally Abused: Not on file  . Physically Abused: Not on file  . Sexually Abused: Not on file    Outpatient Medications Prior to Visit  Medication Sig Dispense Refill  . omeprazole (PRILOSEC) 40 MG capsule Take 40  mg by mouth daily.    . ondansetron (ZOFRAN ODT) 8 MG disintegrating tablet Take 1 tablet (8 mg total) by mouth every 8 (eight) hours as needed for nausea or vomiting. 12 tablet 0  . pantoprazole (PROTONIX) 20 MG tablet Take 1 tablet (20 mg total) by mouth daily. 30 tablet 0  . Prenatal Vit-Fe Fumarate-FA (PRENATAL MULTIVITAMIN) TABS tablet Take 1 tablet by mouth daily at 12 noon. 90 tablet 4  . dicyclomine (BENTYL) 10 MG capsule Take 10 mg by mouth 4 (four) times daily -  before meals and at bedtime.    Marland Kitchen FLUoxetine (PROZAC) 10 MG tablet Take 10 mg by mouth daily.    . norgestimate-ethinyl estradiol (ESTARYLLA) 0.25-35 MG-MCG tablet Take 1 tablet by mouth daily.    . sertraline (ZOLOFT) 50 MG tablet Take 1 tablet (50 mg total) by mouth daily. 30 tablet 2  .  dicyclomine (BENTYL) 20 MG tablet Take 1 tablet (20 mg total) by mouth 2 (two) times daily. (Patient not taking: Reported on 12/26/2019) 20 tablet 0  . ibuprofen (ADVIL,MOTRIN) 600 MG tablet Take 1 tablet (600 mg total) by mouth every 6 (six) hours. (Patient not taking: Reported on 12/26/2019) 30 tablet 0  . norethindrone (ORTHO MICRONOR) 0.35 MG tablet Take 1 tablet (0.35 mg total) by mouth daily. (Patient not taking: Reported on 12/26/2019) 1 Package 11   No facility-administered medications prior to visit.    No Known Allergies  ROS Review of Systems  Constitutional: Positive for fatigue. Negative for chills and fever.  Respiratory: Negative for shortness of breath.   Cardiovascular: Negative for chest pain and leg swelling.  Gastrointestinal: Positive for abdominal pain. Negative for abdominal distention, constipation, nausea and vomiting.  Genitourinary: Positive for menstrual problem and pelvic pain. Negative for decreased urine volume, dyspareunia, dysuria, flank pain, frequency, hematuria, urgency, vaginal bleeding, vaginal discharge and vaginal pain.  Musculoskeletal: Positive for back pain. Negative for joint swelling and myalgias.  Skin: Negative for rash.  Neurological: Negative for dizziness, weakness, light-headedness and headaches.     Objective:    Physical Exam Vitals and nursing note reviewed. Exam conducted with a chaperone present.  Constitutional:      General: She is not in acute distress.    Appearance: Normal appearance. She is normal weight. She is not ill-appearing, toxic-appearing or diaphoretic.  Cardiovascular:     Rate and Rhythm: Normal rate and regular rhythm.     Heart sounds: Normal heart sounds. No murmur heard.  No friction rub. No gallop.   Pulmonary:     Effort: Pulmonary effort is normal. No respiratory distress.     Breath sounds: Normal breath sounds. No stridor. No wheezing, rhonchi or rales.  Chest:     Chest wall: No tenderness.    Abdominal:     General: Abdomen is flat. Bowel sounds are normal. There is no distension.     Tenderness: There is abdominal tenderness. There is guarding. There is no right CVA tenderness, left CVA tenderness or rebound.     Hernia: No hernia is present.  Genitourinary:    General: Normal vulva.     Exam position: Lithotomy position.     Pubic Area: No rash.      Labia:        Right: No rash, tenderness, lesion or injury.        Left: No rash, tenderness, lesion or injury.      Urethra: No prolapse.     Vagina: Normal.  Cervix: No cervical motion tenderness, discharge, friability, lesion, erythema or cervical bleeding.     Uterus: Normal.      Adnexa: Right adnexa normal and left adnexa normal.  Musculoskeletal:        General: No swelling or tenderness. Normal range of motion.  Skin:    General: Skin is warm and dry.     Capillary Refill: Capillary refill takes less than 2 seconds.  Neurological:     General: No focal deficit present.     Mental Status: She is alert and oriented to person, place, and time. Mental status is at baseline.  Psychiatric:        Mood and Affect: Mood normal.        Behavior: Behavior normal.        Thought Content: Thought content normal.        Judgment: Judgment normal.     BP 109/72   Pulse 71   Temp 98 F (36.7 C)   Ht 5\' 3"  (1.6 m)   Wt 160 lb (72.6 kg)   LMP 12/19/2019 (Exact Date)   SpO2 97%   BMI 28.34 kg/m  Wt Readings from Last 3 Encounters:  12/26/19 160 lb (72.6 kg)  12/20/19 158 lb 6.4 oz (71.8 kg)  12/16/19 159 lb 12.8 oz (72.5 kg)     Health Maintenance Due  Topic Date Due  . PAP SMEAR-Modifier  Never done    There are no preventive care reminders to display for this patient.  No results found for: TSH Lab Results  Component Value Date   WBC 8.5 12/16/2019   HGB 13.3 12/16/2019   HCT 39.6 12/16/2019   MCV 91 12/16/2019   PLT 283 11/29/2019   Lab Results  Component Value Date   NA 140 11/29/2019   K  3.7 11/29/2019   CO2 28 11/29/2019   GLUCOSE 155 (H) 11/29/2019   BUN 10 11/29/2019   CREATININE 0.69 11/29/2019   BILITOT 0.6 11/29/2019   ALKPHOS 45 11/29/2019   AST 17 11/29/2019   ALT 22 11/29/2019   PROT 8.0 11/29/2019   ALBUMIN 4.3 11/29/2019   CALCIUM 9.5 11/29/2019   ANIONGAP 12 11/29/2019   Lab Results  Component Value Date   HGBA1C 5.1 12/16/2019      Assessment & Plan:   Problem List Items Addressed This Visit    None    Visit Diagnoses    Pelvic pain    -  Primary   Relevant Orders   Cytology - PAP Will follow up with results. Discussed possible need for Gyn refill or transvaginal ultrasound for further workup.   Depression, unspecified depression type       Relevant Medications   FLUoxetine (PROZAC) 10 MG tablet Refills sent. Stable on Current regimen.   Encounter for surveillance of contraceptive pills       Relevant Medications   norgestimate-ethinyl estradiol (ESTARYLLA) 0.25-35 MG-MCG tablet Refills sent. Stable on current regimen.      Meds ordered this encounter  Medications  . norgestimate-ethinyl estradiol (ESTARYLLA) 0.25-35 MG-MCG tablet    Sig: Take 1 tablet by mouth daily.    Dispense:  90 tablet    Refill:  3  . FLUoxetine (PROZAC) 10 MG tablet    Sig: Take 1 tablet (10 mg total) by mouth daily.    Dispense:  90 tablet    Refill:  3    Follow-up: Return if symptoms worsen or fail to improve, for Follow up at your next  appointment.   Work note provided.  Thank you for your visit with [facility].  Macario CarlsKelsea Ashely Joshua, FNP-BC [facility] [address] [place], [place] [address] [phone]

## 2019-12-28 LAB — CYTOLOGY - PAP
Adequacy: ABSENT
Chlamydia: NEGATIVE
Comment: NEGATIVE
Comment: NORMAL
Diagnosis: NEGATIVE
Neisseria Gonorrhea: NEGATIVE

## 2019-12-28 NOTE — Progress Notes (Signed)
Hello, If you could let Anne Whitaker know her pap that was done in office was negative for abnormalities. Thanks!

## 2020-01-12 ENCOUNTER — Ambulatory Visit: Payer: 59 | Admitting: Physician Assistant

## 2020-01-26 ENCOUNTER — Encounter: Payer: 59 | Admitting: Obstetrics & Gynecology

## 2020-01-31 ENCOUNTER — Encounter: Payer: Self-pay | Admitting: Physician Assistant

## 2020-01-31 ENCOUNTER — Ambulatory Visit: Payer: 59 | Admitting: Gastroenterology

## 2020-01-31 ENCOUNTER — Ambulatory Visit (INDEPENDENT_AMBULATORY_CARE_PROVIDER_SITE_OTHER): Payer: 59 | Admitting: Physician Assistant

## 2020-01-31 VITALS — BP 90/60 | HR 59 | Ht 66.0 in | Wt 156.0 lb

## 2020-01-31 DIAGNOSIS — R112 Nausea with vomiting, unspecified: Secondary | ICD-10-CM | POA: Diagnosis not present

## 2020-01-31 DIAGNOSIS — K5909 Other constipation: Secondary | ICD-10-CM | POA: Diagnosis not present

## 2020-01-31 DIAGNOSIS — R1030 Lower abdominal pain, unspecified: Secondary | ICD-10-CM | POA: Diagnosis not present

## 2020-01-31 MED ORDER — PLENVU 140 G PO SOLR: ORAL | 0 refills | Status: DC

## 2020-01-31 MED ORDER — LINACLOTIDE 72 MCG PO CAPS: 72.0000 ug | ORAL_CAPSULE | Freq: Every day | ORAL | 2 refills | Status: DC

## 2020-01-31 NOTE — Patient Instructions (Signed)
If you are age 31 or older, your body mass index should be between 23-30. Your Body mass index is 25.18 kg/m. If this is out of the aforementioned range listed, please consider follow up with your Primary Care Provider.  If you are age 64 or younger, your body mass index should be between 19-25. Your Body mass index is 25.18 kg/m. If this is out of the aformentioned range listed, please consider follow up with your Primary Care Provider.   You have been scheduled for an endoscopy and colonoscopy. Please follow the written instructions given to you at your visit today. Please pick up your prep supplies at the pharmacy within the next 1-3 days. If you use inhalers (even only as needed), please bring them with you on the day of your procedure.  We have sent the following medications to your pharmacy for you to pick up at your convenience: Linzess 72 mcg once daily 30 minutes  before food.  Thank you for choosing me and [facility].  Hyacinth Meeker, PA-C

## 2020-01-31 NOTE — Progress Notes (Signed)
Chief Complaint: Abdominal pain, nausea and vomiting  HPI:    Anne Whitaker is a 29 year old Arabic female, who was referred to me by Aurther Loft, DO for a complaint of abdominal pain, nausea and vomiting.      11/29/2019 patient seen in the ED for nausea and vomiting. Also described a sensation of abdominal fullness and bloating but not necessarily pain. Apparently told in the past she had a "nervous:". Also reported feeling fatigued and dizzy. CMP with a glucose of 155 otherwise normal. Lipase and CBC normal. She was given Bentyl 20 mg twice daily, Zofran 8 mg every 8 hours and pantoprazole 20 mg daily.    12/21/2019 CT abdomen pelvis without contrast showed no significant abnormality.    12/26/2019 patient seen by PCP for pelvic pain. At that time patient described a "strong family history of cancer". Described a month of lower abdominal pain and back pain with bilateral lower leg pain and fatigue.    Today, the patient is seen with her significant other.  She does not require an interpreter. She was an urgent add-on to my schedule this afternoon.  Patient explains that for 7 years off and on she has had issues with "my colon".  Apparently diagnosed with what sounds like IBS in the past.  Tells me that 3 months ago or so she was having an issue with constant nausea and anytime she would eat she would vomit.  Also described abdominal pain mostly in her lower abdomen, described as a cramping rated as a 7-8/10.  Also tells me that she was having her period 3 times a month.  Describes a chronic issue with constipation and typically uses a stool softener every day, but also sometimes has diarrhea.  She does not feel like the stool softener works as well as she would like it to.  Over the past couple of months patient tells me the nausea has actually gotten slightly better, she is now currently taking omeprazole 40 mg daily.  Does describe seeing some dark red blood in her stool 1-2 times a week.     Family history of stomach cancer in her grandmother.    Denies fever, chills, weight loss or symptoms that awaken her from sleep.  Past Medical History:  Diagnosis Date  . GERD (gastroesophageal reflux disease)   . Left shoulder pain     Past Surgical History:  Procedure Laterality Date  . NO PAST SURGERIES      Current Outpatient Medications  Medication Sig Dispense Refill  . FLUoxetine (PROZAC) 10 MG tablet Take 1 tablet (10 mg total) by mouth daily. 90 tablet 3  . norgestimate-ethinyl estradiol (ESTARYLLA) 0.25-35 MG-MCG tablet Take 1 tablet by mouth daily. 90 tablet 3  . omeprazole (PRILOSEC) 40 MG capsule Take 40 mg by mouth daily.    . ondansetron (ZOFRAN ODT) 8 MG disintegrating tablet Take 1 tablet (8 mg total) by mouth every 8 (eight) hours as needed for nausea or vomiting. 12 tablet 0  . pantoprazole (PROTONIX) 20 MG tablet Take 1 tablet (20 mg total) by mouth daily. 30 tablet 0  . Prenatal Vit-Fe Fumarate-FA (PRENATAL MULTIVITAMIN) TABS tablet Take 1 tablet by mouth daily at 12 noon. 90 tablet 4   No current facility-administered medications for this visit.    Allergies as of 01/31/2020  . (No Known Allergies)    Family History  Problem Relation Age of Onset  . Brain cancer Maternal Grandfather   . Stomach cancer Paternal Grandmother   .  Colon cancer Maternal Aunt   . Colon cancer Paternal Aunt     Social History   Socioeconomic History  . Marital status: Married    Spouse name: Not on file  . Number of children: 0  . Years of education: Not on file  . Highest education level: Not on file  Occupational History  . Not on file  Tobacco Use  . Smoking status: Never Smoker  . Smokeless tobacco: Never Used  Vaping Use  . Vaping Use: Never used  Substance and Sexual Activity  . Alcohol use: No  . Drug use: No  . Sexual activity: Not Currently  Other Topics Concern  . Not on file  Social History Narrative  . Not on file   Social Determinants of Health    Financial Resource Strain:   . Difficulty of Paying Living Expenses: Not on file  Food Insecurity:   . Worried About Programme researcher, broadcasting/film/video in the Last Year: Not on file  . Ran Out of Food in the Last Year: Not on file  Transportation Needs:   . Lack of Transportation (Medical): Not on file  . Lack of Transportation (Non-Medical): Not on file  Physical Activity:   . Days of Exercise per Week: Not on file  . Minutes of Exercise per Session: Not on file  Stress:   . Feeling of Stress : Not on file  Social Connections:   . Frequency of Communication with Friends and Family: Not on file  . Frequency of Social Gatherings with Friends and Family: Not on file  . Attends Religious Services: Not on file  . Active Member of Clubs or Organizations: Not on file  . Attends Banker Meetings: Not on file  . Marital Status: Not on file  Intimate Partner Violence:   . Fear of Current or Ex-Partner: Not on file  . Emotionally Abused: Not on file  . Physically Abused: Not on file  . Sexually Abused: Not on file    Review of Systems:    Constitutional: No weight loss, fever or chills Skin: No rash  Cardiovascular: No chest pain Respiratory: No SOB  Gastrointestinal: See HPI and otherwise negative Genitourinary: No dysuria Neurological: No headache Musculoskeletal: No new muscle or joint pain Hematologic: No bruising Psychiatric: No history of depression or anxiety   Physical Exam:  Vital signs: BP 90/60   Pulse (!) 59   Ht 5\' 6"  (1.676 m)   Wt 156 lb (70.8 kg)   BMI 25.18 kg/m   Constitutional:   Pleasant Caucasian female appears to be in NAD, Well developed, Well nourished, alert and cooperative Head:  Normocephalic and atraumatic. Eyes:   PEERL, EOMI. No icterus. Conjunctiva pink. Ears:  Normal auditory acuity. Neck:  Supple Throat: Oral cavity and pharynx without inflammation, swelling or lesion.  Respiratory: Respirations even and unlabored. Lungs clear to  auscultation bilaterally.   No wheezes, crackles, or rhonchi.  Cardiovascular: Normal S1, S2. No MRG. Regular rate and rhythm. No peripheral edema, cyanosis or pallor.  Gastrointestinal:  Soft, nondistended, marked TTP of the left lower quadrant with involuntary guarding. Normal bowel sounds. No appreciable masses or hepatomegaly. Rectal:  Not performed.  Msk:  Symmetrical without gross deformities. Without edema, no deformity or joint abnormality.  Neurologic:  Alert and  oriented x4;  grossly normal neurologically.  Skin:   Dry and intact without significant lesions or rashes. Psychiatric: Demonstrates good judgement and reason without abnormal affect or behaviors.  RELEVANT LABS AND IMAGING:  CBC    Component Value Date/Time   WBC 8.5 12/16/2019 1741   WBC 7.2 11/29/2019 1415   RBC 4.37 12/16/2019 1741   RBC 4.30 11/29/2019 1415   HGB 13.3 12/16/2019 1741   HCT 39.6 12/16/2019 1741   PLT 283 11/29/2019 1415   MCV 91 12/16/2019 1741   MCH 30.4 12/16/2019 1741   MCH 31.2 11/29/2019 1415   MCHC 33.6 12/16/2019 1741   MCHC 34.8 11/29/2019 1415   RDW 12.3 12/16/2019 1741   LYMPHSABS 3.5 (H) 12/16/2019 1741   MONOABS 0.4 05/30/2017 0740   EOSABS 0.1 12/16/2019 1741   BASOSABS 0.1 12/16/2019 1741    CMP     Component Value Date/Time   NA 140 11/29/2019 1415   K 3.7 11/29/2019 1415   CL 100 11/29/2019 1415   CO2 28 11/29/2019 1415   GLUCOSE 155 (H) 11/29/2019 1415   BUN 10 11/29/2019 1415   CREATININE 0.69 11/29/2019 1415   CALCIUM 9.5 11/29/2019 1415   PROT 8.0 11/29/2019 1415   ALBUMIN 4.3 11/29/2019 1415   AST 17 11/29/2019 1415   ALT 22 11/29/2019 1415   ALKPHOS 45 11/29/2019 1415   BILITOT 0.6 11/29/2019 1415   GFRNONAA >60 11/29/2019 1415   GFRAA >60 11/23/2019 1832    Assessment: 1.  Lower abdominal pain: Some chronic symptoms, but this is acutely worse over the past few months, also being evaluated by OB/GYN who is planning on a transvaginal ultrasound;  consider IBS versus relation to constipation 2.  Constipation: Chronic for the patient, some relief with what sounds like a stool softener, but not complete 3.  Hematochezia: For the past few months, now 1-2 times a week, described as dark red; consider hemorrhoids versus other 4.  Nausea and vomiting: Slightly better over the past month or so, difficult to tolerate tell timing of Omeprazole use, not sure if this is what changed her symptoms; consider gastritis versus other  Plan: 1.  Scheduled the patient for diagnostic EGD and [facility].  Did provide the patient with a detailed list of risk for the procedures and she agrees to proceed.  She will need to be Covid tested 2 days prior to time procedures. 2.  For now continue Omeprazole 40 mg daily 3.  Patient requested different medicine for her constipation.  Prescribed Linzess 72 mcg daily, provided her with samples as well.  She is to let me know if this causes too much diarrhea. 4.  Patient to follow in clinic per recommendations from Dr. Barron Alvine after time of procedures.  Hyacinth Meeker, PA-C [facility] 01/31/2020, 2:28 PM  Cc: Aurther Loft, DO

## 2020-02-02 NOTE — Progress Notes (Signed)
Agree with the assessment and plan as outlined by Jennifer Lemmon, PA-C. ? ?Canon Gola, DO, FACG ? ?

## 2020-02-08 ENCOUNTER — Encounter: Payer: Self-pay | Admitting: Gastroenterology

## 2020-02-10 ENCOUNTER — Other Ambulatory Visit: Payer: Self-pay | Admitting: Gastroenterology

## 2020-02-10 LAB — SARS CORONAVIRUS 2 (TAT 6-24 HRS): SARS Coronavirus 2: NEGATIVE

## 2020-02-12 ENCOUNTER — Encounter: Payer: Self-pay | Admitting: Certified Registered Nurse Anesthetist

## 2020-02-13 ENCOUNTER — Ambulatory Visit (AMBULATORY_SURGERY_CENTER): Payer: 59 | Admitting: Gastroenterology

## 2020-02-13 ENCOUNTER — Other Ambulatory Visit: Payer: Self-pay

## 2020-02-13 ENCOUNTER — Encounter: Payer: Self-pay | Admitting: Gastroenterology

## 2020-02-13 VITALS — BP 109/70 | HR 45 | Temp 97.5°F | Resp 22 | Ht 66.0 in | Wt 156.0 lb

## 2020-02-13 DIAGNOSIS — K5909 Other constipation: Secondary | ICD-10-CM

## 2020-02-13 DIAGNOSIS — R112 Nausea with vomiting, unspecified: Secondary | ICD-10-CM

## 2020-02-13 DIAGNOSIS — B9681 Helicobacter pylori [H. pylori] as the cause of diseases classified elsewhere: Secondary | ICD-10-CM | POA: Diagnosis not present

## 2020-02-13 DIAGNOSIS — R1030 Lower abdominal pain, unspecified: Secondary | ICD-10-CM | POA: Diagnosis not present

## 2020-02-13 DIAGNOSIS — K2900 Acute gastritis without bleeding: Secondary | ICD-10-CM

## 2020-02-13 DIAGNOSIS — K2951 Unspecified chronic gastritis with bleeding: Secondary | ICD-10-CM | POA: Diagnosis not present

## 2020-02-13 DIAGNOSIS — K640 First degree hemorrhoids: Secondary | ICD-10-CM

## 2020-02-13 DIAGNOSIS — K921 Melena: Secondary | ICD-10-CM

## 2020-02-13 HISTORY — PX: COLONOSCOPY: SHX174

## 2020-02-13 HISTORY — PX: UPPER GASTROINTESTINAL ENDOSCOPY: SHX188

## 2020-02-13 MED ORDER — SODIUM CHLORIDE 0.9 % IV SOLN: 500.0000 mL | Freq: Once | INTRAVENOUS | Status: DC

## 2020-02-13 NOTE — Patient Instructions (Signed)
Handouts Provided:  Gastritis  YOU HAD AN ENDOSCOPIC PROCEDURE TODAY AT THE [facility]:   Refer to the procedure report that was given to you for any specific questions about what was found during the examination.  If the procedure report does not answer your questions, please call your gastroenterologist to clarify.  If you requested that your care partner not be given the details of your procedure findings, then the procedure report has been included in a sealed envelope for you to review at your convenience later.  YOU SHOULD EXPECT: Some feelings of bloating in the abdomen. Passage of more gas than usual.  Walking can help get rid of the air that was put into your GI tract during the procedure and reduce the bloating. If you had a lower endoscopy (such as a colonoscopy or flexible sigmoidoscopy) you may notice spotting of blood in your stool or on the toilet paper. If you underwent a bowel prep for your procedure, you may not have a normal bowel movement for a few days.  Please Note:  You might notice some irritation and congestion in your nose or some drainage.  This is from the oxygen used during your procedure.  There is no need for concern and it should clear up in a day or so.  SYMPTOMS TO REPORT IMMEDIATELY:   Following lower endoscopy (colonoscopy or flexible sigmoidoscopy):  Excessive amounts of blood in the stool  Significant tenderness or worsening of abdominal pains  Swelling of the abdomen that is new, acute  Fever of 100F or higher   Following upper endoscopy (EGD)  Vomiting of blood or coffee ground material  New chest pain or pain under the shoulder blades  Painful or persistently difficult swallowing  New shortness of breath  Fever of 100F or higher  Black, tarry-looking stools  For urgent or emergent issues, a gastroenterologist can be reached at any hour by calling [phone]. Do not use MyChart messaging for urgent concerns.    DIET:  We do  recommend a small meal at first, but then you may proceed to your regular diet.  Drink plenty of fluids but you should avoid alcoholic beverages for 24 hours.  ACTIVITY:  You should plan to take it easy for the rest of today and you should NOT DRIVE or use heavy machinery until tomorrow (because of the sedation medicines used during the test).    FOLLOW UP: Our staff will call the number listed on your records 48-72 hours following your procedure to check on you and address any questions or concerns that you may have regarding the information given to you following your procedure. If we do not reach you, we will leave a message.  We will attempt to reach you two times.  During this call, we will ask if you have developed any symptoms of COVID 19. If you develop any symptoms (ie: fever, flu-like symptoms, shortness of breath, cough etc.) before then, please call [phone].  If you test positive for Covid 19 in the 2 weeks post procedure, please call and report this information to [place].    If any biopsies were taken you will be contacted by phone or by letter within the next 1-3 weeks.  Please call us at [phone] if you have not heard about the biopsies in 3 weeks.    SIGNATURES/CONFIDENTIALITY: You and/or your care partner have signed paperwork which will be entered into your electronic medical record.  These signatures attest to the fact that  that the information above on your After Visit Summary has been reviewed and is understood.  Full responsibility of the confidentiality of this discharge information lies with you and/or your care-partner.

## 2020-02-13 NOTE — Progress Notes (Signed)
Report given to PACU, vss 

## 2020-02-13 NOTE — Op Note (Signed)
[facility] Patient Name: Anne Whitaker Procedure Date: 02/13/2020 2:35 PM MRN: [id number] Endoscopist: Doristine Locks , MD Age: 29 Referring MD:  Date of Birth: December 17, 1990 Gender: Female Account #: [id number] Procedure:                Upper GI endoscopy Indications:              Generalized abdominal pain, Nausea with vomiting Medicines:                Monitored Anesthesia Care Procedure:                Pre-Anesthesia Assessment:                           - Prior to the procedure, a History and Physical                            was performed, and patient medications and                            allergies were reviewed. The patient's tolerance of                            previous anesthesia was also reviewed. The risks                            and benefits of the procedure and the sedation                            options and risks were discussed with the patient.                            All questions were answered, and informed consent                            was obtained. Prior Anticoagulants: The patient has                            taken no previous anticoagulant or antiplatelet                            agents. ASA Grade Assessment: II - A patient with                            mild systemic disease. After reviewing the risks                            and benefits, the patient was deemed in                            satisfactory condition to undergo the procedure.                           After obtaining informed consent, the endoscope was  passed under direct vision. Throughout the                            procedure, the patient's blood pressure, pulse, and                            oxygen saturations were monitored continuously. The                            Endoscope was introduced through the mouth, and                            advanced to the second part of duodenum. The upper                             GI endoscopy was accomplished without difficulty.                            The patient tolerated the procedure well. Scope In: Scope Out: Findings:                 The examined esophagus was normal.                           Diffuse minimal inflammation characterized by                            erythema was found in the gastric body and in the                            gastric antrum. Biopsies were taken with a cold                            forceps for Helicobacter pylori testing. Estimated                            blood loss was minimal. The pylorus was patent and                            easily traversed.                           The examined duodenum was normal. Biopsies were                            taken with a cold forceps for histology. Estimated                            blood loss was minimal. Complications:            No immediate complications. Estimated Blood Loss:     Estimated blood loss was minimal. Impression:               - Normal esophagus.                           -  Gastritis. Biopsied.                           - The pylorus was patent and easily traversed.                           - Normal examined duodenum. Biopsied. Recommendation:           - Patient has a contact number available for                            emergencies. The signs and symptoms of potential                            delayed complications were discussed with the                            patient. Return to normal activities tomorrow.                            Written discharge instructions were provided to the                            patient.                           - Resume previous diet.                           - Continue present medications.                           - Await pathology results.                           - Return to GI clinic PRN. Doristine Locks, MD 02/13/2020 3:11:56 PM

## 2020-02-13 NOTE — Progress Notes (Signed)
Vital signs checked by:CW ? ?The medical and surgical history was reviewed and verified with the patient. ? ?

## 2020-02-13 NOTE — Op Note (Signed)
[facility] Patient Name: Anne Whitaker Procedure Date: 02/13/2020 2:35 PM MRN: [id number] Endoscopist: Doristine Locks , MD Age: 29 Referring MD:  Date of Birth: [birth year] Gender: Female Account #: [id number] Procedure:                Colonoscopy Indications:              Generalized abdominal pain, Abdominal pain in the                            left lower quadrant, Hematochezia, Constipation Medicines:                Monitored Anesthesia Care Procedure:                Pre-Anesthesia Assessment:                           - Prior to the procedure, a History and Physical                            was performed, and patient medications and                            allergies were reviewed. The patient's tolerance of                            previous anesthesia was also reviewed. The risks                            and benefits of the procedure and the sedation                            options and risks were discussed with the patient.                            All questions were answered, and informed consent                            was obtained. Prior Anticoagulants: The patient has                            taken no previous anticoagulant or antiplatelet                            agents. ASA Grade Assessment: II - A patient with                            mild systemic disease. After reviewing the risks                            and benefits, the patient was deemed in                            satisfactory condition to undergo the procedure.  After obtaining informed consent, the colonoscope                            was passed under direct vision. Throughout the                            procedure, the patient's blood pressure, pulse, and                            oxygen saturations were monitored continuously. The                            Olympus PFC-[id number] (#[id number]) Colonoscope was                            introduced  through the anus and advanced to the the                            terminal ileum. The colonoscopy was performed                            without difficulty. The patient tolerated the                            procedure well. The quality of the bowel                            preparation was good. The terminal ileum, ileocecal                            valve, appendiceal orifice, and rectum were                            photographed. Scope In: 2:53:42 PM Scope Out: 3:06:15 PM Scope Withdrawal Time: 0 hours 10 minutes 39 seconds  Total Procedure Duration: 0 hours 12 minutes 33 seconds  Findings:                 The perianal and digital rectal examinations were                            normal.                           The colon appeared normal throughout.                           Non-bleeding internal hemorrhoids were found during                            retroflexion. The hemorrhoids were very small and                            Grade I (internal hemorrhoids that do not prolapse).  The terminal ileum appeared normal. Complications:            No immediate complications. Estimated Blood Loss:     Estimated blood loss was minimal. Estimated blood                            loss: none. Impression:               - The entire examined colon is normal.                           - Non-bleeding internal hemorrhoids.                           - The examined portion of the ileum was normal.                           - No specimens collected. Recommendation:           - Patient has a contact number available for                            emergencies. The signs and symptoms of potential                            delayed complications were discussed with the                            patient. Return to normal activities tomorrow.                            Written discharge instructions were provided to the                            patient.                            - Resume previous diet.                           - Continue present medications.                           - Repeat colonoscopy at age 31 for screening                            purposes.                           - Return to GI clinic PRN. Doristine Locks, MD 02/13/2020 3:16:15 PM

## 2020-02-13 NOTE — Progress Notes (Signed)
Called to room to assist during endoscopic procedure.  Patient ID and intended procedure confirmed with present staff. Received instructions for my participation in the procedure from the performing physician.  

## 2020-02-13 NOTE — Progress Notes (Signed)
1442 Robinul 0.1 mg IV given due large amount of secretions upon assessment.  MD made aware, vss 

## 2020-02-16 ENCOUNTER — Telehealth: Payer: Self-pay

## 2020-02-16 NOTE — Telephone Encounter (Signed)
NO number available for post procedure follow up call.

## 2020-03-02 ENCOUNTER — Telehealth: Payer: Self-pay | Admitting: General Surgery

## 2020-03-02 MED ORDER — DOXYCYCLINE MONOHYDRATE 100 MG PO TABS: 100.0000 mg | ORAL_TABLET | Freq: Two times a day (BID) | ORAL | 0 refills | Status: AC

## 2020-03-02 MED ORDER — METRONIDAZOLE 250 MG PO TABS: 250.0000 mg | ORAL_TABLET | Freq: Four times a day (QID) | ORAL | 0 refills | Status: AC

## 2020-03-02 NOTE — Telephone Encounter (Signed)
-----   Message from Elwood V, DO sent at 03/02/2020  1:17 PM EST ----- The biopsies from the recent upper GI Endoscopy were notable for the following:  -The biopsies taken from your small intestine were normal and there was no evidence of Celiac Disease.  -The biopsies taken from the stomach were notable for H. Pylori gastritis, and will plan on treating with quad therapy as below. Please confirm no medication allergies to the prescribed regimen.   1) Omeprazole 20 mg 2 times a day x 14 d 2) Pepto Bismol 2 tabs (262 mg each) 4 times a day x 14 d 3) Metronidazole 250 mg 4 times a day x 14 d 4) doxycycline 100 mg 2 times a day x 14 d  After 14 days, ok to stop omeprazole.  4 weeks after treatment completed, check H. Pylori stool antigen to confirm eradication (must be off acid suppression therapy)  Dx: H. Pylori gastritis

## 2020-03-02 NOTE — Telephone Encounter (Signed)
Tried to have Rand with [employer] contact the patient to give her results to endoscopy and explain her prescriptions. No voicemail. Will try again later. Meanwhile I will send a my chart message with all information and order prescriptions.

## 2020-03-27 ENCOUNTER — Telehealth: Payer: Self-pay | Admitting: General Surgery

## 2020-03-27 NOTE — Telephone Encounter (Signed)
Used [employer] and spoke with [employer]. He tried the patients number and there was no answer and no voicemail set up.

## 2020-04-06 ENCOUNTER — Other Ambulatory Visit: Payer: Self-pay | Admitting: Gastroenterology

## 2020-04-06 DIAGNOSIS — K2970 Gastritis, unspecified, without bleeding: Secondary | ICD-10-CM

## 2020-04-06 DIAGNOSIS — B9681 Helicobacter pylori [H. pylori] as the cause of diseases classified elsewhere: Secondary | ICD-10-CM

## 2020-04-06 MED ORDER — DOXYCYCLINE HYCLATE 100 MG PO TABS: 100.0000 mg | ORAL_TABLET | Freq: Two times a day (BID) | ORAL | 0 refills | Status: AC

## 2020-04-06 MED ORDER — BISMUTH SUBSALICYLATE 262 MG PO CHEW: 524.0000 mg | CHEWABLE_TABLET | Freq: Four times a day (QID) | ORAL | 0 refills | Status: AC

## 2020-04-06 MED ORDER — OMEPRAZOLE 20 MG PO CPDR: 20.0000 mg | DELAYED_RELEASE_CAPSULE | Freq: Two times a day (BID) | ORAL | 0 refills | Status: DC

## 2020-04-06 MED ORDER — METRONIDAZOLE 250 MG PO TABS: 250.0000 mg | ORAL_TABLET | Freq: Four times a day (QID) | ORAL | 0 refills | Status: AC

## 2021-02-24 NOTE — L&D Delivery Note (Signed)
Delivery Note At 12:23 AM a viable female was delivered via  (Presentation: Left Occiput Anterior).  APGAR: 8, 9; weight  .   Placenta status: Pathology, Intact.  Cord: 3 vessels with the following complications: None.  Cord pH: arterial collected  Tight double nuchal cord, unable to reduce until delivery of infant  Anesthesia: Epidural Episiotomy: None Lacerations: 2nd degree, rectum intact Suture Repair: 2.0 vicryl rapide Est. Blood Loss (mL):  255 cc  Placenta to path for previous IUGR (that resolved during third trimester)  Mom to postpartum.  Baby to Couplet care / Skin to Skin.  Carlyon Shadow 12/17/2021, 12:46 AM

## 2021-05-19 ENCOUNTER — Other Ambulatory Visit: Payer: Self-pay

## 2021-05-19 ENCOUNTER — Emergency Department (HOSPITAL_COMMUNITY): Payer: 59

## 2021-05-19 ENCOUNTER — Emergency Department (HOSPITAL_COMMUNITY)
Admission: EM | Admit: 2021-05-19 | Discharge: 2021-05-19 | Disposition: A | Discharge status: Home / Self Care | Payer: 59 | Attending: Emergency Medicine | Admitting: Emergency Medicine

## 2021-05-19 ENCOUNTER — Encounter (HOSPITAL_COMMUNITY): Payer: Self-pay

## 2021-05-19 DIAGNOSIS — R55 Syncope and collapse: Secondary | ICD-10-CM

## 2021-05-19 DIAGNOSIS — W1809XA Striking against other object with subsequent fall, initial encounter: Secondary | ICD-10-CM | POA: Insufficient documentation

## 2021-05-19 DIAGNOSIS — R1030 Lower abdominal pain, unspecified: Secondary | ICD-10-CM | POA: Insufficient documentation

## 2021-05-19 DIAGNOSIS — O9A211 Injury, poisoning and certain other consequences of external causes complicating pregnancy, first trimester: Secondary | ICD-10-CM | POA: Diagnosis present

## 2021-05-19 DIAGNOSIS — R0789 Other chest pain: Secondary | ICD-10-CM | POA: Diagnosis not present

## 2021-05-19 DIAGNOSIS — R710 Precipitous drop in hematocrit: Secondary | ICD-10-CM | POA: Diagnosis not present

## 2021-05-19 DIAGNOSIS — Y92002 Bathroom of unspecified non-institutional (private) residence single-family (private) house as the place of occurrence of the external cause: Secondary | ICD-10-CM | POA: Insufficient documentation

## 2021-05-19 DIAGNOSIS — O26891 Other specified pregnancy related conditions, first trimester: Secondary | ICD-10-CM | POA: Insufficient documentation

## 2021-05-19 DIAGNOSIS — S0101XA Laceration without foreign body of scalp, initial encounter: Secondary | ICD-10-CM | POA: Insufficient documentation

## 2021-05-19 DIAGNOSIS — R109 Unspecified abdominal pain: Secondary | ICD-10-CM

## 2021-05-19 DIAGNOSIS — R102 Pelvic and perineal pain: Secondary | ICD-10-CM | POA: Insufficient documentation

## 2021-05-19 DIAGNOSIS — Z3A09 9 weeks gestation of pregnancy: Secondary | ICD-10-CM | POA: Insufficient documentation

## 2021-05-19 LAB — CBC
HCT: 36.4 % (ref 36.0–46.0)
Hemoglobin: 12.9 g/dL (ref 12.0–15.0)
MCH: 31.4 pg (ref 26.0–34.0)
MCHC: 35.4 g/dL (ref 30.0–36.0)
MCV: 88.6 fL (ref 80.0–100.0)
Platelets: 244 10*3/uL (ref 150–400)
RBC: 4.11 MIL/uL (ref 3.87–5.11)
RDW: 12.9 % (ref 11.5–15.5)
WBC: 10.3 10*3/uL (ref 4.0–10.5)
nRBC: 0 % (ref 0.0–0.2)

## 2021-05-19 LAB — BASIC METABOLIC PANEL
Anion gap: 7 (ref 5–15)
BUN: 11 mg/dL (ref 6–20)
CO2: 24 mmol/L (ref 22–32)
Calcium: 9.4 mg/dL (ref 8.9–10.3)
Chloride: 104 mmol/L (ref 98–111)
Creatinine, Ser: 0.66 mg/dL (ref 0.44–1.00)
GFR, Estimated: >60 mL/min (ref >60)
Glucose, Bld: 88 mg/dL (ref 70–99)
Potassium: 3.6 mmol/L (ref 3.5–5.1)
Sodium: 135 mmol/L (ref 135–145)

## 2021-05-19 LAB — CBG MONITORING, ED: Glucose-Capillary: 80 mg/dL (ref 70–99)

## 2021-05-19 LAB — I-STAT BETA HCG BLOOD, ED (MC, WL, AP ONLY): I-stat hCG, quantitative: >2000 m[IU]/mL — ABNORMAL HIGH (ref <5)

## 2021-05-19 NOTE — ED Triage Notes (Signed)
Patient had a syncopal episode in the restroom tonight and reports hitting her head and left shoulder. Patient reports positive pregnancy test but has not been to her first appointment yet.  ?

## 2021-05-19 NOTE — ED Provider Triage Note (Signed)
Emergency Medicine Provider Triage Evaluation Note ? ?Anne Whitaker , a 31 y.o. female  was evaluated in triage.  Pt complains of fall and syncopal episode. Reports head injury. Reports right sided pelvic pain. She is currently pregnant. Denies vaginal bleeding ? ?Review of Systems  ?Positive: Syncope, head injury, pelvic pain ?Negative: Vaginal bleeding ? ?Physical Exam  ?BP 130/77 (BP Location: Left Arm)   Pulse (!) 54   Temp 98.4 ?F (36.9 ?C) (Oral)   Resp 17   SpO2 100%  ?Gen:   Awake, no distress   ?Resp:  Normal effort  ?MSK:   Moves extremities without difficulty  ?Other:  Clear speech, ambulatory with steady gait ? ?Medical Decision Making  ?Medically screening exam initiated at 8:46 PM.  Appropriate orders placed.  Anne Whitaker was informed that the remainder of the evaluation will be completed by another provider, this initial triage assessment does not replace that evaluation, and the importance of remaining in the ED until their evaluation is complete. ? ? ?  ?Karrie Meres, PA-C ?05/19/21 2047 ? ?

## 2021-05-19 NOTE — ED Provider Notes (Addendum)
?[facility]-EMERGENCY DEPT ?Provider Note ? ? ?CSN: [id number] ?Arrival date & time: 05/19/21  2021 ? ?  ? ?History ? ?Chief Complaint  ?Patient presents with  ? Loss of Consciousness  ? ? ?Anne Whitaker is a 31 y.o. female. ? ?Patient presents to ER after syncopal episode.  She states she was taking a shower tonight when she got lightheaded and dizzy next and she knew she was waking up on the ground.  Complaining of headache after having fallen and hitting the back of her head.  Denies any nausea or vomiting.  Also complaining of left-sided chest pain that is tender to palpation that she noticed after the fall.  Complaining of lower suprapubic pain today as well.  Denies any vaginal bleeding.  She states her last menstrual period was 2 months ago and she had a positive pregnancy test a few days ago at home she is pending an OB/GYN visit in 1 week. ? ? ?  ? ?Home Medications ?Prior to Admission medications   ?Medication Sig Start Date End Date Taking? Authorizing Provider  ?FLUoxetine (PROZAC) 10 MG capsule Take 10 mg by mouth daily. 11/23/19   [provider]  ?linaclotide Karlene Einstein) 72 MCG capsule Take 1 capsule (72 mcg total) by mouth daily before breakfast. 01/31/20   Unk Lightning, PA  ?Multiple Vitamin (MULTIVITAMIN) tablet Take 1 tablet by mouth daily.    [provider]  ?norgestimate-ethinyl estradiol (ESTARYLLA) 0.25-35 MG-MCG tablet Take 1 tablet by mouth daily. 12/26/19   Just, Azalee Course, FNP  ?omeprazole (PRILOSEC) 20 MG capsule Take 1 capsule (20 mg total) by mouth 2 (two) times daily before a meal for 14 days. 04/06/20 04/20/20  Cirigliano, Vito V, DO  ?omeprazole (PRILOSEC) 40 MG capsule Take 40 mg by mouth daily.    [provider]  ?   ? ?Allergies    ?Patient has no known allergies.   ? ?Review of Systems   ?Review of Systems  ?Constitutional:  Negative for fever.  ?HENT:  Negative for ear pain.   ?Eyes:  Negative for pain.  ?Respiratory:   Negative for cough.   ?Cardiovascular:  Negative for chest pain.  ?Gastrointestinal:  Positive for abdominal pain.  ?Genitourinary:  Negative for flank pain.  ?Musculoskeletal:  Negative for back pain.  ?Skin:  Negative for rash.  ?Neurological:  Positive for headaches.  ? ?Physical Exam ?Updated Vital Signs ?BP (!) 103/58   Pulse 64   Temp 98.4 ?F (36.9 ?C) (Oral)   Resp 20   SpO2 95%  ?Physical Exam ?Constitutional:   ?   General: She is not in acute distress. ?   Appearance: Normal appearance.  ?HENT:  ?   Head: Normocephalic.  ?   Comments: Superficial laceration seen on the occipital region of the scalp at her site of tenderness.  Nongaping wound does not require stitches or staples. ?   Nose: Nose normal.  ?Eyes:  ?   Extraocular Movements: Extraocular movements intact.  ?Cardiovascular:  ?   Rate and Rhythm: Normal rate.  ?Pulmonary:  ?   Effort: Pulmonary effort is normal.  ?Abdominal:  ?   General: There is no distension.  ?   Tenderness: There is abdominal tenderness.  ?   Comments: Moderate bilateral lower pelvic tenderness on exam.  No guarding or rebound noted.  ?Musculoskeletal:     ?   General: Normal range of motion.  ?   Cervical back: Normal range of motion.  ?  Comments: Left lateral chest wall tender to palpation reproduces her pain.  ?Neurological:  ?   General: No focal deficit present.  ?   Mental Status: She is alert and oriented to person, place, and time. Mental status is at baseline.  ?   Cranial Nerves: No cranial nerve deficit.  ?   Motor: No weakness.  ?   Gait: Gait normal.  ? ? ?ED Results / Procedures / Treatments   ?Labs ?(all labs ordered are listed, but only abnormal results are displayed) ?Labs Reviewed  ?I-STAT BETA HCG BLOOD, ED ([facility], [facility], [facility] ONLY) - Abnormal; Notable for the following components:  ?    Result Value  ? I-stat hCG, quantitative >2,000.0 (*)   ? All other components within normal limits  ?BASIC METABOLIC PANEL  ?CBC  ?URINALYSIS, ROUTINE W REFLEX MICROSCOPIC   ?CBG MONITORING, ED  ? ? ?EKG ?EKG Interpretation ? ?Date/Time:  Sunday May 19 2021 20:43:29 EDT ?Ventricular Rate:  61 ?PR Interval:  152 ?QRS Duration: 99 ?QT Interval:  395 ?QTC Calculation: 398 ?R Axis:   37 ?Text Interpretation: Sinus rhythm Confirmed by Virgina Norfolk [id number]) on 05/19/2021 9:02:34 PM ? ?Radiology ?DG Chest 2 View ? ?Result Date: 05/19/2021 ?CLINICAL DATA:  Syncopal episode. EXAM: CHEST - 2 VIEW COMPARISON:  None. FINDINGS: The heart size and mediastinal contours are within normal limits. Both lungs are clear. The visualized skeletal structures are unremarkable. IMPRESSION: No active cardiopulmonary disease. Electronically Signed   By: Aram Candela M.D.   On: 05/19/2021 21:38  ? ?CT Head Wo Contrast ? ?Result Date: 05/19/2021 ?CLINICAL DATA:  Trauma. EXAM: CT HEAD WITHOUT CONTRAST TECHNIQUE: Contiguous axial images were obtained from the base of the skull through the vertex without intravenous contrast. RADIATION DOSE REDUCTION: This exam was performed according to the departmental dose-optimization program which includes automated exposure control, adjustment of the mA and/or kV according to patient size and/or use of iterative reconstruction technique. COMPARISON:  None. FINDINGS: Brain: The ventricles and sulci are appropriate size for the patient's age. The gray-white matter discrimination is preserved. There is no acute intracranial hemorrhage. No mass effect or midline shift. No extra-axial fluid collection. Vascular: No hyperdense vessel or unexpected calcification. Skull: Normal. Negative for fracture or focal lesion. Sinuses/Orbits: No acute finding. Other: None IMPRESSION: Unremarkable noncontrast CT of the brain. Electronically Signed   By: Elgie Collard M.D.   On: 05/19/2021 21:33  ? ?DG Shoulder Left ? ?Result Date: 05/19/2021 ?CLINICAL DATA:  Syncopal episode, left shoulder pain EXAM: LEFT SHOULDER - 2 VIEW COMPARISON:  None. FINDINGS: There is no evidence of fracture or  dislocation. There is no evidence of arthropathy or other focal bone abnormality. Soft tissues are unremarkable. IMPRESSION: Negative. Electronically Signed   By: Wiliam Ke M.D.   On: 05/19/2021 21:37   ? ?Procedures ?Procedures  ? ? ?Medications Ordered in ED ?Medications - No data to display ? ?ED Course/ Medical Decision Making/ A&P ?  ?                        ?Medical Decision Making ?Amount and/or Complexity of Data Reviewed ?Labs: ordered. ?Radiology: ordered. ? ? ?Review of record shows gastroenterology visit last year. ? ?Cardiac monitor shows sinus rhythm. ? ?Work-up shows white count of 10 hemoglobin 13 chemistry remarkable beta-hCG elevated greater than 2000. ? ?Imaging studies of the head and chest are unremarkable for acute pathology. ? ?Ultrasound of the abdomen pursued given positive pregnancy and  syncope and abdominal pain however imaging consistent with positive intrauterine pregnancy no additional complications noted. ? ?Recommending outpatient follow-up with OB/GYN within the week, recommending increase fluid intake at home and rest.  Recommending immediate return for worsening symptoms fevers pain or any additional concerns. ? ?\ ?Final Clinical Impression(s) / ED Diagnoses ?Final diagnoses:  ?Syncope and collapse  ?Abdominal pain, unspecified abdominal location  ? ? ?Rx / DC Orders ?ED Discharge Orders   ? ? None  ? ?  ? ? ?  ?Cheryll CockayneHong, Cortnee Steinmiller S, MD ?05/19/21 2242 ? ?  ?Cheryll CockayneHong, Deaundra Dupriest S, MD ?05/19/21 2252 ? ?

## 2021-05-19 NOTE — Discharge Instructions (Signed)
Call your primary care doctor or specialist as discussed in the next 2-3 days.   Return immediately back to the ER if:  Your symptoms worsen within the next 12-24 hours. You develop new symptoms such as new fevers, persistent vomiting, new pain, shortness of breath, or new weakness or numbness, or if you have any other concerns.  

## 2021-06-03 LAB — OB RESULTS CONSOLE ABO/RH: RH Type: POSITIVE

## 2021-06-03 LAB — OB RESULTS CONSOLE HEPATITIS B SURFACE ANTIGEN: Hepatitis B Surface Ag: NEGATIVE

## 2021-06-03 LAB — OB RESULTS CONSOLE RUBELLA ANTIBODY, IGM: Rubella: IMMUNE

## 2021-06-03 LAB — OB RESULTS CONSOLE HIV ANTIBODY (ROUTINE TESTING): HIV: NONREACTIVE

## 2021-06-03 LAB — HEPATITIS C ANTIBODY: HCV Ab: NEGATIVE

## 2021-06-03 LAB — OB RESULTS CONSOLE GC/CHLAMYDIA
Chlamydia: NEGATIVE
Neisseria Gonorrhea: NEGATIVE

## 2021-06-14 ENCOUNTER — Other Ambulatory Visit: Payer: Self-pay | Admitting: Obstetrics and Gynecology

## 2021-06-14 DIAGNOSIS — Z363 Encounter for antenatal screening for malformations: Secondary | ICD-10-CM

## 2021-07-18 ENCOUNTER — Encounter: Payer: Self-pay | Admitting: *Deleted

## 2021-07-24 ENCOUNTER — Ambulatory Visit: Payer: 59 | Admitting: *Deleted

## 2021-07-24 ENCOUNTER — Other Ambulatory Visit: Payer: Self-pay | Admitting: Obstetrics and Gynecology

## 2021-07-24 ENCOUNTER — Ambulatory Visit: Payer: 59 | Attending: Obstetrics and Gynecology | Admitting: Obstetrics and Gynecology

## 2021-07-24 ENCOUNTER — Ambulatory Visit: Payer: 59 | Attending: Obstetrics and Gynecology

## 2021-07-24 ENCOUNTER — Encounter: Payer: Self-pay | Admitting: *Deleted

## 2021-07-24 ENCOUNTER — Other Ambulatory Visit: Payer: Self-pay | Admitting: *Deleted

## 2021-07-24 VITALS — BP 97/53 | HR 65

## 2021-07-24 DIAGNOSIS — O09299 Supervision of pregnancy with other poor reproductive or obstetric history, unspecified trimester: Secondary | ICD-10-CM

## 2021-07-24 DIAGNOSIS — O09899 Supervision of other high risk pregnancies, unspecified trimester: Secondary | ICD-10-CM | POA: Insufficient documentation

## 2021-07-24 DIAGNOSIS — O09892 Supervision of other high risk pregnancies, second trimester: Secondary | ICD-10-CM

## 2021-07-24 DIAGNOSIS — Z3A19 19 weeks gestation of pregnancy: Secondary | ICD-10-CM

## 2021-07-24 DIAGNOSIS — Z363 Encounter for antenatal screening for malformations: Secondary | ICD-10-CM

## 2021-07-24 NOTE — Progress Notes (Signed)
Maternal-Fetal Medicine   Name: Anne Whitaker DOB: [birth year] MRN: [id number] Referring Provider: Damaris Hippo, MD  I had the pleasure of seeing Ms. Anne [facility] today at [facility]. She is G2 P0100 at 19-weeks' gestation and is here for fetal anatomy scan. Patient declined language interpreter services.   Obstetric history significant spontaneous preterm delivery in 05/2017 at [redacted] weeks gestation. At 22w 4d gestation, she had abdominal cramping with vaginal bleeding.  On vaginal examination, the cervix was 4 cm dilated with intact membranes.  Patient did not have fever.  [facility] consultation was obtained and since PPROM was suspected, cervical cerclage was not performed.  4 days later, she delivered a female infant.  After staying in the NICU for 2 months, the infant, unfortunately, diet prematurity complications. No history of abnormal Pap smears or cervical surgeries.  Past medical history: No history of diabetes or hypertension or any chronic medical conditions. Past surgical history: Nil of note. Allergies: No known drug allergies. Social history: Denies tobacco or drug or alcohol use.  She was accompanied by her husband and he is in good health. Family history: No history of venous thromboembolism in the family. Prenatal course: On cell-free fetal DNA screening, the risks of fetal aneuploidies are not increased.  Ultrasound We performed fetal anatomical survey.  Amniotic fluid is normal and good fetal activity seen.  No markers of aneuploidies or fetal structural defects are seen.  Fetal biometry is consistent with the previously established dates. Because of her history of preterm delivery, we performed a transvaginal ultrasound to evaluate the cervix.  The shortest and best cervical length measurement is 2.8 cm, which is normal.  No shortening or funneling was seen on transfundal pressure.  History of preterm delivery This is the single  most-common cause of recurrent preterm birth. I informed her that previous studies have shown the beneficial effect of progesterone therapy in reducing the incidence of preterm births in women with history of preterm birth.   Patient was counseled that we previously used to prescribe 17-OH progesterone injections (Makena) in pregnant women who had history of preterm deliveries.  Subsequent randomized trial showed no benefit of progesterone prophylaxis.  Following this study (PROLONG Trial), the FDA did not approve its use and prevention of preterm delivery.  Makena has been withdrawn.  [facility] endorsed FDA's decision and recommended that this preparation of progesterone should not be given to women with history of preterm delivery.  Vaginal progesterone to prevent preterm delivery is controversial.  Cervical length measurements help in making reasonable decisions.  I informed the patient that if cervical length is between 2.5 to 3 cm, vaginal progesterone may be considered.  Studies have shown conflicting results but there appears no benefit of vaginal progesterone in preventing preterm deliveries with cervical length measurements greater than 2.5 cm.  If cervical length is 2.5 cm or less in pregnant women with history of preterm delivery, cerclage procedure or vaginal progesterone should be considered.  I reassured the patient of normal cervical length measurement on today's ultrasound.  I recommended weekly cervical length measurement till 23 weeks' gestation. Rescue cerclage should be considered if cervical length measurement is below 2.5 cm. Alternatively, prophylactic cerclage may be performed now.  Patient would like to have weekly cervical length measurements.  Recommendations -Appointments were made for weekly cervical length measurements. -Fetal growth assessment and completion of fetal anatomy in 4 weeks.  Thank you for consultation.  If you have any questions or  concerns, please  contact me the [facility].  Consultation including face-to-face (more than 50%) counseling 45 minutes.

## 2021-08-02 ENCOUNTER — Other Ambulatory Visit: Payer: Self-pay | Admitting: Obstetrics and Gynecology

## 2021-08-02 ENCOUNTER — Ambulatory Visit: Payer: 59 | Attending: Obstetrics and Gynecology | Admitting: Obstetrics and Gynecology

## 2021-08-02 ENCOUNTER — Ambulatory Visit: Payer: 59 | Admitting: *Deleted

## 2021-08-02 ENCOUNTER — Ambulatory Visit: Payer: 59 | Attending: Obstetrics and Gynecology

## 2021-08-02 VITALS — BP 109/59 | HR 73

## 2021-08-02 DIAGNOSIS — Z3A20 20 weeks gestation of pregnancy: Secondary | ICD-10-CM | POA: Diagnosis not present

## 2021-08-02 DIAGNOSIS — O09892 Supervision of other high risk pregnancies, second trimester: Secondary | ICD-10-CM | POA: Insufficient documentation

## 2021-08-02 DIAGNOSIS — O3432 Maternal care for cervical incompetence, second trimester: Secondary | ICD-10-CM

## 2021-08-02 DIAGNOSIS — O09212 Supervision of pregnancy with history of pre-term labor, second trimester: Secondary | ICD-10-CM | POA: Diagnosis not present

## 2021-08-02 NOTE — Progress Notes (Signed)
Maternal-Fetal Medicine   Name: Anne Whitaker DOB: [birth year] MRN: [id number] Referring Provider: Damaris Hippo, MD  Patient return for transvaginal ultrasound to evaluate cervical length because of her history of preterm delivery (see [facility] consultation of her previous visit).  She does not have symptoms of pelvic pressure or vaginal bleeding.  Ultrasound A limited ultrasound study was performed.  Amniotic fluid is normal and good fetal activity seen.  On transvaginal ultrasound, funneling was seen and the residual closed portion of the cervix measures 1.6 cm.  Cervical insufficiency I explained the finding of cervical insufficiency with the help of ultrasound images and diagrams.  Cervical insufficiency is associated with increased risk of midtrimester miscarriage/preterm delivery. I discussed the options including rescue cerclage or vaginal progesterone treatment till [redacted] weeks gestation. Given that she has a history of preterm delivery, I recommended rescue cerclage.  Progesterone may be continued after cerclage till delivery. Patient was counseled at her last visit that intramuscular progesterone prophylaxis has been withdrawn. I explained cerclage procedure and possible complications including bleeding, infection, miscarriage and injury to bladder or bowel (all rare). Patient agreed to have rescue cerclage.  I discussed the findings and recommendations with Dr. Rana Snare.  If cerclage will be delayed till after the weekend, I recommend starting vaginal progesterone 200 mg now.  Recommendations -Rescue cerclage -Vaginal Prometrium 200 mg daily starting now and continue after cerclage till delivery. -An appointment was made for her to return in 2 weeks for transvaginal evaluation of cervical length.  Thank you for consultation.  If you have any questions or concerns, please contact me the [facility].  Consultation including face-to-face (more  than 50%) counseling 20 minutes.

## 2021-08-05 ENCOUNTER — Encounter (HOSPITAL_COMMUNITY): Payer: Self-pay | Admitting: Obstetrics and Gynecology

## 2021-08-05 ENCOUNTER — Encounter (HOSPITAL_COMMUNITY): Payer: Self-pay | Admitting: *Deleted

## 2021-08-05 ENCOUNTER — Telehealth (HOSPITAL_COMMUNITY): Payer: Self-pay | Admitting: *Deleted

## 2021-08-05 NOTE — Anesthesia Preprocedure Evaluation (Addendum)
Anesthesia Evaluation  Patient identified by MRN, date of birth, ID band Patient awake    Reviewed: Allergy & Precautions, NPO status , Patient's Chart, lab work & pertinent test results  Airway Mallampati: II  TM Distance: >3 FB Neck ROM: Full    Dental no notable dental hx. (+) Teeth Intact   Pulmonary neg pulmonary ROS,    Pulmonary exam normal breath sounds clear to auscultation       Cardiovascular negative cardio ROS Normal cardiovascular exam Rhythm:Regular Rate:Normal     Neuro/Psych Depression negative neurological ROS     GI/Hepatic Neg liver ROS, GERD  Medicated,  Endo/Other  negative endocrine ROS  Renal/GU negative Renal ROS  negative genitourinary   Musculoskeletal negative musculoskeletal ROS (+)   Abdominal   Peds  Hematology negative hematology ROS (+)   Anesthesia Other Findings   Reproductive/Obstetrics (+) Pregnancy Incompetent cervix                            Anesthesia Physical Anesthesia Plan  ASA: 2  Anesthesia Plan: Spinal   Post-op Pain Management: Minimal or no pain anticipated   Induction:   PONV Risk Score and Plan: 2 and Treatment may vary due to age or medical condition  Airway Management Planned: Natural Airway  Additional Equipment: None  Intra-op Plan:   Post-operative Plan:   Informed Consent: I have reviewed the patients History and Physical, chart, labs and discussed the procedure including the risks, benefits and alternatives for the proposed anesthesia with the patient or authorized representative who has indicated his/her understanding and acceptance.     Dental advisory given  Plan Discussed with: Anesthesiologist and CRNA  Anesthesia Plan Comments:        Anesthesia Quick Evaluation

## 2021-08-05 NOTE — H&P (Signed)
Antepartum History and Physical   Anne Whitaker is a 31 y.o. female G3P0110 that presents for scheduled rescue cerclage.  She has a history of preterm delivery and neonatal loss with prior pregnnacy.  For her current pregnnacy, she has undergone serial cervical lengths at MFM and at most recent visit, CL was 1.6 cm with funneling, and cerclage was recommended. She also also placed on vaginal progesterone.  Patient reports feeling well today.  She denies LOF, VB, ctx.  OB History     Gravida  3   Para  1   Term  0   Preterm  1   AB  1   Living  0      SAB  0   IAB  1   Ectopic  0   Multiple  0   Live Births  1          Past Medical History:  Diagnosis Date   GERD (gastroesophageal reflux disease)    Left shoulder pain    Past Surgical History:  Procedure Laterality Date   COLONOSCOPY  02/13/2020   NO PAST SURGERIES     UPPER GASTROINTESTINAL ENDOSCOPY  02/13/2020   Family History: family history includes Brain cancer in her maternal grandfather; Colon cancer in her maternal aunt and paternal aunt; Stomach cancer in her paternal grandmother. Social History:  reports that she has never smoked. She has never used smokeless tobacco. She reports that she does not drink alcohol and does not use drugs.      Review of Systems History   Blood pressure 112/67, pulse 79, temperature 98.9 F (37.2 C), temperature source Oral, last menstrual period 03/13/2021, SpO2 100 %, unknown if currently breastfeeding. Exam Physical Exam  Gen: alert, well appearing, no distress Chest: nonlabored breathing CV: no peripheral edema Abdomen: soft, nontender Ext: no evidence of DVT   Prenatal labs: ABO, Rh: --/--/PENDING (06/13 1218) Antibody: PENDING (06/13 1218) Rubella:   RPR:    HBsAg:    HIV:    GBS:     Assessment/Plan: Viability was confirmed at last office visit. G/C was negative at NOB visit. She has no absolute contraindications for a cerclage.  Risks of  cerclage were discussed including chance of Infection, bleeding, ROM, suture migration, cervical dystocia/trauma, and failure. All questions answered and consent was given. No abx is indicated .   She is RH positive Proceed with cerclage.   Carlyon Shadow 08/06/2021, 12:53 PM

## 2021-08-05 NOTE — Telephone Encounter (Signed)
Preadmission screen  Interpreter number [phone] Discovered the patient speaks english.  Instructed to arrive at 1100.  NPO p MN.  No medications per pt.  Med HX reviewed.  Questions answered.

## 2021-08-06 ENCOUNTER — Observation Stay (HOSPITAL_COMMUNITY): Payer: 59 | Admitting: Anesthesiology

## 2021-08-06 ENCOUNTER — Encounter (HOSPITAL_COMMUNITY): Payer: Self-pay | Admitting: Obstetrics and Gynecology

## 2021-08-06 ENCOUNTER — Encounter (HOSPITAL_COMMUNITY)
Admission: RE | Disposition: A | Discharge status: Home / Self Care | Payer: Self-pay | Source: Home / Self Care | Attending: Obstetrics and Gynecology

## 2021-08-06 ENCOUNTER — Other Ambulatory Visit: Payer: Self-pay

## 2021-08-06 ENCOUNTER — Observation Stay (HOSPITAL_COMMUNITY)
Admission: RE | Admit: 2021-08-06 | Discharge: 2021-08-06 | Disposition: A | Discharge status: Home / Self Care | Payer: 59 | Attending: Obstetrics and Gynecology | Admitting: Obstetrics and Gynecology

## 2021-08-06 ENCOUNTER — Observation Stay (HOSPITAL_BASED_OUTPATIENT_CLINIC_OR_DEPARTMENT_OTHER): Payer: 59 | Admitting: Anesthesiology

## 2021-08-06 DIAGNOSIS — Z3A21 21 weeks gestation of pregnancy: Secondary | ICD-10-CM | POA: Diagnosis not present

## 2021-08-06 DIAGNOSIS — O3432 Maternal care for cervical incompetence, second trimester: Principal | ICD-10-CM | POA: Insufficient documentation

## 2021-08-06 HISTORY — PX: CERVICAL CERCLAGE: SHX1329

## 2021-08-06 LAB — CBC
HCT: 31.7 % — ABNORMAL LOW (ref 36.0–46.0)
Hemoglobin: 11.2 g/dL — ABNORMAL LOW (ref 12.0–15.0)
MCH: 33.2 pg (ref 26.0–34.0)
MCHC: 35.3 g/dL (ref 30.0–36.0)
MCV: 94.1 fL (ref 80.0–100.0)
Platelets: 216 10*3/uL (ref 150–400)
RBC: 3.37 MIL/uL — ABNORMAL LOW (ref 3.87–5.11)
RDW: 14.1 % (ref 11.5–15.5)
WBC: 7.9 10*3/uL (ref 4.0–10.5)
nRBC: 0 % (ref 0.0–0.2)

## 2021-08-06 LAB — TYPE AND SCREEN
ABO/RH(D): O POS
Antibody Screen: NEGATIVE

## 2021-08-06 SURGERY — CERCLAGE, CERVIX, VAGINAL APPROACH
Anesthesia: Spinal

## 2021-08-06 MED ORDER — ACETAMINOPHEN 325 MG PO TABS
ORAL_TABLET | ORAL | Status: AC
  Filled 2021-08-06: qty 2

## 2021-08-06 MED ORDER — LACTATED RINGERS IV SOLN: INTRAVENOUS | Status: DC

## 2021-08-06 MED ORDER — OXYCODONE HCL 5 MG/5ML PO SOLN: 5.0000 mg | Freq: Once | ORAL | Status: DC | PRN

## 2021-08-06 MED ORDER — SOD CITRATE-CITRIC ACID 500-334 MG/5ML PO SOLN
30.0000 mL | ORAL | Status: AC
  Administered 2021-08-06: 30 mL via ORAL

## 2021-08-06 MED ORDER — ONDANSETRON HCL 4 MG/2ML IJ SOLN: 4.0000 mg | Freq: Once | INTRAMUSCULAR | Status: DC | PRN

## 2021-08-06 MED ORDER — SOD CITRATE-CITRIC ACID 500-334 MG/5ML PO SOLN
ORAL | Status: AC
  Filled 2021-08-06: qty 30

## 2021-08-06 MED ORDER — ACETAMINOPHEN 325 MG PO TABS
650.0000 mg | ORAL_TABLET | Freq: Once | ORAL | Status: AC
Start: 2021-08-06 — End: 2021-08-06
  Administered 2021-08-06: 650 mg via ORAL

## 2021-08-06 MED ORDER — OXYCODONE HCL 5 MG PO TABS: 5.0000 mg | ORAL_TABLET | Freq: Once | ORAL | Status: DC | PRN

## 2021-08-06 MED ORDER — ONDANSETRON HCL 4 MG/2ML IJ SOLN
INTRAMUSCULAR | Status: DC | PRN
  Administered 2021-08-06: 4 mg via INTRAVENOUS

## 2021-08-06 MED ORDER — BUPIVACAINE IN DEXTROSE 0.75-8.25 % IT SOLN
INTRATHECAL | Status: DC | PRN
  Administered 2021-08-06: 1.2 mL via INTRATHECAL

## 2021-08-06 MED ORDER — ONDANSETRON HCL 4 MG/2ML IJ SOLN
INTRAMUSCULAR | Status: AC
  Filled 2021-08-06: qty 2

## 2021-08-06 MED ORDER — FENTANYL CITRATE (PF) 100 MCG/2ML IJ SOLN: 25.0000 ug | INTRAMUSCULAR | Status: DC | PRN

## 2021-08-06 SURGICAL SUPPLY — 1 items: SUT MERSILENE FIBER S 5 MO-4 1 (SUTURE) ×1 IMPLANT

## 2021-08-06 NOTE — Anesthesia Procedure Notes (Signed)
Spinal  Patient location during procedure: OR Start time: 08/06/2021 1:29 PM End time: 08/06/2021 1:33 PM Reason for block: surgical anesthesia Staffing Performed: anesthesiologist  Anesthesiologist: Mal Amabile, MD Performed by: Mal Amabile, MD Authorized by: Mal Amabile, MD   Preanesthetic Checklist Completed: patient identified, IV checked, site marked, risks and benefits discussed, surgical consent, monitors and equipment checked, pre-op evaluation and timeout performed Spinal Block Patient position: sitting Prep: DuraPrep and site prepped and draped Patient monitoring: heart rate, cardiac monitor, continuous pulse ox and blood pressure Approach: midline Location: L3-4 Injection technique: single-shot Needle Needle type: Pencan  Needle gauge: 24 G Needle length: 9 cm Needle insertion depth: 6 cm Assessment Sensory level: T6 Events: CSF return Additional Notes Patient tolerated procedure well. Adequate sensory level.

## 2021-08-06 NOTE — Op Note (Signed)
PREOPERATIVE DIAGNOSES: 1. Cervical incompetence, prior cerclage  POSTOPERATIVE DIAGNOSES: 1. same  PROCEDURE PERFORMED: Cervical cerclage - history indicated  SURGEON: Dr. Nilda Simmer ASSISTANT: Dr. Harold Hedge  ANESTHESIA: Spinal  ESTIMATED BLOOD LOSS: 5cc.  URINE OUTPUT: clear urine at the end of the procedure.  COMPLICATIONS: None  TUBES: None.  DRAINS: None  Indications:  31 yo G3P0110 presenting for ultrasound-indicated cerclage s/s history of periviable delivery and fetal loss with new onset shortened cervix on ultrasound at 1.6 cm. Viability was confirmed at last office visit. G/C was negative at NOB visit. She has no absolute contraindications for a cerclage.  Risks of cerclage were discussed including chance of Infection, bleeding, ROM, suture migration, cervical dystocia/trauma, and failure. All questions answered and consent was given. No abx is indicated .   She is RH positive   FINDINGS: On exam, under anesthesia, normal appearing vulva and vagina, cervix with scarring from prior cerclage - no cervical dilation  Procedure: FHT noted. A spinal anethesia was placed.  The patient was prepared and draped in the usual sterile manner for a vaginal procedure. A foley was placed A weighted speculum was placed in the posterior vaginal vault. The cervix was grasped with a ring forceps.  Ethibond non-absorbable suture was placed at 12 o'clock, at the junction of the rugated vaginal epithelium and the smooth cervix just distal to the vesicocervical reflection and at least 1.5 cm above the external os.  Four passes of a purse-string suture were taken circumferentially around the entire cervix as high as safely possible, avoiding the bladder, rectum, and uterine vessels. Approximately 1 cm of space was left between the exit of one pass and the entry of the next pass. The two ends of the suture were then tied securely and cut, leaving the ends long enough to grasp with a clamp  when it is time to remove it. The knot was at 6 o'clock. Hemostasis was achieved.  The sponge and lap counts were correct times 2 at this time. The patient's procedure was terminated. We then awakened her. She was sent to the Recovery Room in good condition.    Nilda Simmer MD

## 2021-08-06 NOTE — Discharge Summary (Signed)
Obstetric Discharge Summary  Anne Whitaker is a 31 y.o. female that presented on 08/06/2021 for placement of [place] cerclage.  She has  history of periviable loss with prior pregnancy and has undergone serial cervical lengths.  Her cervic was noted to be shortened to 1.6 cm at 20 weeks with MFM and transcervical cerclage was recommended.  Her procedure was uncomplicated and well tolerated. She was discharged home after appropriate monitoring in PACU with plans for in office follow up.  Hemoglobin  Date Value Ref Range Status  08/06/2021 11.2 (L) 12.0 - 15.0 g/dL Final  12/16/2019 13.3 11.1 - 15.9 g/dL Final   HCT  Date Value Ref Range Status  08/06/2021 31.7 (L) 36.0 - 46.0 % Final   Hematocrit  Date Value Ref Range Status  12/16/2019 39.6 34.0 - 46.6 % Final    Physical Exam:  Gen: alert, well appearing, no distress Chest: nonlabored breathing CV: no peripheral edema Abdomen: soft, nontender Ext: no evidence of DVT   Discharge Diagnoses: cervical insufficiency, s/p cerclage placement  Discharge Information: Date: 08/06/2021 Activity: Pelvic rest, as tolerated Diet: pregnancy Medications: Tylenol, PNV Condition: stable Instructions: Refer to practice specific booklet.  Discussed prior to discharge.  Discharge to: [facility] Follow up.   Why: Please follow up as scheduled. The office will reach out to check in. Contact information: [address] [place] [address] [phone]                .  Carlyon Shadow 08/06/2021, 8:44 PM

## 2021-08-06 NOTE — Transfer of Care (Signed)
Immediate Anesthesia Transfer of Care Note  Patient: Anne Whitaker  Procedure(s) Performed: CERCLAGE CERVICAL  Patient Location: PACU  Anesthesia Type:Spinal  Level of Consciousness: awake  Airway & Oxygen Therapy: Patient Spontanous Breathing  Post-op Assessment: Report given to RN and Post -op Vital signs reviewed and stable  Post vital signs: Reviewed and stable  Last Vitals:  Vitals Value Taken Time  BP 92/55 08/06/21 1406  Temp 36.7 C 08/06/21 1405  Pulse 60 08/06/21 1408  Resp 15 08/06/21 1408  SpO2 99 % 08/06/21 1408  Vitals shown include unvalidated device data.  Last Pain:  Vitals:   08/06/21 1152  TempSrc: Oral         Complications: No notable events documented.

## 2021-08-07 LAB — RPR: RPR Ser Ql: NONREACTIVE

## 2021-08-07 NOTE — Anesthesia Postprocedure Evaluation (Addendum)
Anesthesia Post Note  Patient: Anjolina Nait Rabah  Procedure(s) Performed: CERCLAGE CERVICAL     Patient location during evaluation: PACU Anesthesia Type: Spinal Level of consciousness: oriented and awake and alert Pain management: pain level controlled Vital Signs Assessment: post-procedure vital signs reviewed and stable Respiratory status: spontaneous breathing, respiratory function stable and nonlabored ventilation Cardiovascular status: blood pressure returned to baseline and stable Postop Assessment: no headache, no backache, no apparent nausea or vomiting, spinal receding and patient able to bend at knees Anesthetic complications: no   No notable events documented.  Last Vitals:  Vitals:   08/06/21 1630 08/06/21 1645  BP: (!) 92/52 (!) 97/52  Pulse: 64 73  Resp: 20 (!) 21  Temp:    SpO2: 100% 100%    Last Pain:  Vitals:   08/06/21 1600  TempSrc:   PainSc: 1    Pain Goal:                   Haely Leyland A.

## 2021-08-09 ENCOUNTER — Ambulatory Visit: Payer: 59

## 2021-08-15 ENCOUNTER — Ambulatory Visit: Payer: 59 | Admitting: *Deleted

## 2021-08-15 ENCOUNTER — Ambulatory Visit: Payer: 59 | Attending: Obstetrics and Gynecology

## 2021-08-15 ENCOUNTER — Other Ambulatory Visit: Payer: Self-pay | Admitting: Obstetrics and Gynecology

## 2021-08-15 VITALS — BP 105/44 | HR 68

## 2021-08-15 DIAGNOSIS — O09892 Supervision of other high risk pregnancies, second trimester: Secondary | ICD-10-CM | POA: Diagnosis present

## 2021-08-15 DIAGNOSIS — O09292 Supervision of pregnancy with other poor reproductive or obstetric history, second trimester: Secondary | ICD-10-CM

## 2021-08-15 DIAGNOSIS — O3432 Maternal care for cervical incompetence, second trimester: Secondary | ICD-10-CM | POA: Diagnosis not present

## 2021-08-15 DIAGNOSIS — Z3A22 22 weeks gestation of pregnancy: Secondary | ICD-10-CM

## 2021-08-20 ENCOUNTER — Observation Stay (HOSPITAL_COMMUNITY)
Admission: AD | Admit: 2021-08-20 | Discharge: 2021-08-21 | Disposition: A | Discharge status: Home / Self Care | Payer: 59 | Attending: Obstetrics & Gynecology | Admitting: Obstetrics & Gynecology

## 2021-08-20 ENCOUNTER — Other Ambulatory Visit: Payer: Self-pay

## 2021-08-20 ENCOUNTER — Encounter (HOSPITAL_COMMUNITY): Payer: Self-pay | Admitting: Obstetrics & Gynecology

## 2021-08-20 DIAGNOSIS — Z3A22 22 weeks gestation of pregnancy: Secondary | ICD-10-CM | POA: Diagnosis not present

## 2021-08-20 DIAGNOSIS — N883 Incompetence of cervix uteri: Principal | ICD-10-CM | POA: Diagnosis present

## 2021-08-20 DIAGNOSIS — O3432 Maternal care for cervical incompetence, second trimester: Principal | ICD-10-CM | POA: Insufficient documentation

## 2021-08-20 DIAGNOSIS — Z3A23 23 weeks gestation of pregnancy: Secondary | ICD-10-CM | POA: Diagnosis not present

## 2021-08-20 DIAGNOSIS — O3433 Maternal care for cervical incompetence, third trimester: Secondary | ICD-10-CM | POA: Diagnosis not present

## 2021-08-20 LAB — TYPE AND SCREEN
ABO/RH(D): O POS
Antibody Screen: NEGATIVE

## 2021-08-20 LAB — URINALYSIS, ROUTINE W REFLEX MICROSCOPIC
Bilirubin Urine: NEGATIVE
Glucose, UA: NEGATIVE mg/dL
Hgb urine dipstick: NEGATIVE
Ketones, ur: NEGATIVE mg/dL
Leukocytes,Ua: NEGATIVE
Nitrite: NEGATIVE
Protein, ur: NEGATIVE mg/dL
Specific Gravity, Urine: 1.025 (ref 1.005–1.030)
pH: 6 (ref 5.0–8.0)

## 2021-08-20 LAB — AMNISURE RUPTURE OF MEMBRANE (ROM) NOT AT ARMC: Amnisure ROM: NEGATIVE

## 2021-08-20 LAB — CBC
HCT: 28 % — ABNORMAL LOW (ref 36.0–46.0)
Hemoglobin: 10.4 g/dL — ABNORMAL LOW (ref 12.0–15.0)
MCH: 35.1 pg — ABNORMAL HIGH (ref 26.0–34.0)
MCHC: 37.1 g/dL — ABNORMAL HIGH (ref 30.0–36.0)
MCV: 94.6 fL (ref 80.0–100.0)
Platelets: 216 10*3/uL (ref 150–400)
RBC: 2.96 MIL/uL — ABNORMAL LOW (ref 3.87–5.11)
RDW: 13.4 % (ref 11.5–15.5)
WBC: 8.9 10*3/uL (ref 4.0–10.5)
nRBC: 0 % (ref 0.0–0.2)

## 2021-08-20 MED ORDER — ACETAMINOPHEN 325 MG PO TABS
650.0000 mg | ORAL_TABLET | ORAL | Status: DC | PRN
  Administered 2021-08-20: 650 mg via ORAL
  Filled 2021-08-20: qty 2

## 2021-08-20 MED ORDER — BETAMETHASONE SOD PHOS & ACET 6 (3-3) MG/ML IJ SUSP
12.0000 mg | INTRAMUSCULAR | Status: AC
  Administered 2021-08-20 – 2021-08-21 (×2): 12 mg via INTRAMUSCULAR
  Filled 2021-08-20: qty 5

## 2021-08-20 MED ORDER — PROGESTERONE 200 MG PO CAPS
200.0000 mg | ORAL_CAPSULE | Freq: Every day | ORAL | Status: DC
  Administered 2021-08-20: 200 mg via VAGINAL
  Filled 2021-08-20: qty 1

## 2021-08-20 MED ORDER — ZOLPIDEM TARTRATE 5 MG PO TABS: 5.0000 mg | ORAL_TABLET | Freq: Every evening | ORAL | Status: DC | PRN

## 2021-08-20 MED ORDER — INDOMETHACIN 25 MG PO CAPS: 50.0000 mg | ORAL_CAPSULE | Freq: Once | ORAL | Status: DC

## 2021-08-20 MED ORDER — INDOMETHACIN 25 MG PO CAPS: 25.0000 mg | ORAL_CAPSULE | Freq: Four times a day (QID) | ORAL | Status: DC

## 2021-08-20 MED ORDER — DOCUSATE SODIUM 100 MG PO CAPS
100.0000 mg | ORAL_CAPSULE | Freq: Every day | ORAL | Status: DC
  Administered 2021-08-21: 100 mg via ORAL
  Filled 2021-08-20: qty 1

## 2021-08-20 MED ORDER — LACTATED RINGERS IV SOLN: INTRAVENOUS | Status: DC

## 2021-08-20 MED ORDER — CALCIUM CARBONATE ANTACID 500 MG PO CHEW: 2.0000 | CHEWABLE_TABLET | ORAL | Status: DC | PRN

## 2021-08-20 MED ORDER — PRENATAL MULTIVITAMIN CH
1.0000 | ORAL_TABLET | Freq: Every day | ORAL | Status: DC
Start: 2021-08-21 — End: 2021-08-22
  Administered 2021-08-21: 1 via ORAL
  Filled 2021-08-20: qty 1

## 2021-08-21 ENCOUNTER — Inpatient Hospital Stay (HOSPITAL_COMMUNITY): Payer: 59

## 2021-08-21 DIAGNOSIS — Z3A22 22 weeks gestation of pregnancy: Secondary | ICD-10-CM

## 2021-08-21 DIAGNOSIS — O3442 Maternal care for other abnormalities of cervix, second trimester: Secondary | ICD-10-CM

## 2021-08-21 DIAGNOSIS — N883 Incompetence of cervix uteri: Secondary | ICD-10-CM

## 2021-08-21 DIAGNOSIS — Z3A23 23 weeks gestation of pregnancy: Secondary | ICD-10-CM

## 2021-08-21 DIAGNOSIS — O09292 Supervision of pregnancy with other poor reproductive or obstetric history, second trimester: Secondary | ICD-10-CM | POA: Diagnosis not present

## 2021-08-21 DIAGNOSIS — O3432 Maternal care for cervical incompetence, second trimester: Secondary | ICD-10-CM | POA: Diagnosis not present

## 2021-08-21 DIAGNOSIS — O3433 Maternal care for cervical incompetence, third trimester: Secondary | ICD-10-CM

## 2021-08-21 NOTE — Discharge Summary (Signed)
Physician Discharge Summary  Patient ID: Anne Whitaker MRN: [id number] DOB/AGE: 03-14-1990 31 y.o.  Admit date: 08/20/2021 Discharge date: 08/21/2021  Admission Diagnoses:cervical incompetence  Discharge Diagnoses:  Principal Problem:   Cervical incompetence   Discharged Condition: good  Hospital Course: admitted to antenatal floor with cramping pain. She had a question of leaking fluid but Amniosure was negative. MFM U/S noted normal AFI, VTX and cervix is unchanged from their U/S of 08/06/21. She received BMZ x 2. Her discomfort readily responded to IV fluids. MFM consultation was obtained. Options discussed including cerclage revision and continued expectant management with vaginal nightly progesterone. She prefers expectant management.   Consults:  MFM  Significant Diagnostic Studies: labs:  Results for orders placed or performed during the hospital encounter of 08/20/21 (from the past 72 hour(s))  Culture, beta strep (group b only)     Status: None (Preliminary result)   Collection Time: 08/20/21  6:19 PM   Specimen: Vaginal/Rectal; Genital  Result Value Ref Range   Specimen Description VAGINAL/RECTAL    Special Requests NONE    Culture      CULTURE REINCUBATED FOR BETTER GROWTH Performed at [facility], [address], [place], [place] [address]    Report Status PENDING   Amnisure rupture of membrane (rom)not at [facility]     Status: None   Collection Time: 08/20/21  6:30 PM  Result Value Ref Range   Amnisure ROM NEGATIVE     Comment: Performed at [facility], [address] [place], [place] [address]  Type and screen [facility]     Status: None   Collection Time: 08/20/21  6:40 PM  Result Value Ref Range   ABO/RH(D) O POS    Antibody Screen NEG    Sample Expiration      08/23/2021,2359 Performed at [facility], [address] [place], [place] [address]   CBC     Status: Abnormal   Collection Time: 08/20/21  6:40 PM  Result  Value Ref Range   WBC 8.9 4.0 - 10.5 K/uL   RBC 2.96 (L) 3.87 - 5.11 MIL/uL   Hemoglobin 10.4 (L) 12.0 - 15.0 g/dL   HCT 88.4 (L) 16.6 - 06.3 %   MCV 94.6 80.0 - 100.0 fL   MCH 35.1 (H) 26.0 - 34.0 pg   MCHC 37.1 (H) 30.0 - 36.0 g/dL   RDW 01.6 01.0 - 93.2 %   Platelets 216 150 - 400 K/uL   nRBC 0.0 0.0 - 0.2 %    Comment: Performed at [facility], [address] [place], [place] [address]  Urinalysis, Routine w reflex microscopic Urine, Clean Catch     Status: Abnormal   Collection Time: 08/20/21  7:11 PM  Result Value Ref Range   Color, Urine YELLOW YELLOW   APPearance HAZY (A) CLEAR   Specific Gravity, Urine 1.025 1.005 - 1.030   pH 6.0 5.0 - 8.0   Glucose, UA NEGATIVE NEGATIVE mg/dL   Hgb urine dipstick NEGATIVE NEGATIVE   Bilirubin Urine NEGATIVE NEGATIVE   Ketones, ur NEGATIVE NEGATIVE mg/dL   Protein, ur NEGATIVE NEGATIVE mg/dL   Nitrite NEGATIVE NEGATIVE   Leukocytes,Ua NEGATIVE NEGATIVE    Comment: Performed at [facility], [address] [place], [place] [address]      Treatments: IV hydration and BMZ  Discharge Exam: Blood pressure (!) 95/57, pulse 65, temperature 98.3 F (36.8 C), temperature source Oral, resp. rate 17,  height 5\' 3"  (1.6 m), weight 67.1 kg, last menstrual period 03/13/2021, SpO2 100 %, unknown if currently breastfeeding. General appearance: alert, cooperative, and no distress GI: soft, non-tender; bowel sounds normal; no masses,  no organomegaly, uterus NT  Disposition: Discharge disposition: 01-Home or Self Care        Allergies as of 08/21/2021   No Known Allergies      Medication List     TAKE these medications    multivitamin tablet Take 1 tablet by mouth daily.   progesterone 200 MG Supp Place 200 mg vaginally at bedtime.         Signed: 08/23/2021 II 08/21/2021, 6:06 PM

## 2021-08-21 NOTE — Progress Notes (Signed)
   08/21/21 1850  Departure Condition  Departure Condition Good  Mobility at [facility]  Patient/Caregiver Teaching Teach Back Method Used;Discharge instructions reviewed;Follow-up care reviewed;Medications discussed;Patient/caregiver verbalized understanding  Departure Mode With significant other  Was procedural sedation performed on this patient during this visit? No   Patient alert and oriented x4, no pain, and VS stable. All questions and concerns addressed.

## 2021-08-21 NOTE — Progress Notes (Signed)
No pain, leaking or bleeding  Reviewed options including cerclage revision and continued expectant management with nightly progesterone.  She prefers expectant management. We discussed discharge vs continued hospitalization. She wants to go home. In a week her husband will be leaving the country for 3 weeks.  We discussed rest, fluids, continue nightly vaginal progesterone. She will call for any changes.

## 2021-08-21 NOTE — Progress Notes (Signed)
Second Reviewers for fetal strip before discharge was Marquis Buggy, RN and Dr.Tomblin

## 2021-08-21 NOTE — Plan of Care (Signed)

## 2021-08-21 NOTE — Progress Notes (Signed)
Patient reports no overnight events.  Feeling better this am; mild right sided abdominal soreness.  Pain does not come and go and feels positional.  No low back pain at this time.  +FM.  No more LOF and no VB.     08/21/2021    3:27 AM 08/20/2021   11:24 PM 08/20/2021    7:53 PM  Vitals with BMI  Systolic 97 103 102  Diastolic 60 58 58  Pulse 66 71 67      Latest Ref Rng & Units 08/20/2021    6:40 PM 08/06/2021   12:18 PM 05/19/2021    9:43 PM  CBC  WBC 4.0 - 10.5 K/uL 8.9  7.9  10.3   Hemoglobin 12.0 - 15.0 g/dL 68.3  41.9  62.2   Hematocrit 36.0 - 46.0 % 28.0  31.7  36.4   Platelets 150 - 400 K/uL 216  216  244    FHT: reassuring for GA Toco: flat Gen: A&O x 3 Abd: soft, TTP in RLQ (mild) Ext: no c/c/e  30yo G3P0110 at [redacted]w[redacted]d with cervical incompetence and cerclage; threated preterm labor -BMZ (2nd dose today) -Defer Indocin/Magnesium for tocolysis at this time given improvement in pain.  Will start prn. -s/p NICU consult.  Patient wants full intervention at this GA. -GBS pending -Continue vaginal prometrium -Plan repeat u/s and MFM consult ordered for today -Consider revision of stitch based upon repeat u/s and symptoms/stability   -SCDs -Monitor for s/sx chorioamnionitis  Mitchel Honour, DO

## 2021-08-21 NOTE — Consult Note (Signed)
MFM Brief Notes  Anne Whitaker is seen today cervical length due to increased back and pelvic pressure. She is seen today at the request of Dr. Henderson Cloud.  Anne Whitaker history is complicated by a history of cervical incompetence resulting in delivery at 22 w 4d.  She has been consulted previously by Dr. Noralee Space who advised a cerclage and vaginal progesterone. She had a cerclage placed that today is 0.7 cm from the external os and has been on prometrium nightly.  Today we observed funnling with a small bird peak with membranes near the cervix. There is 0.7 cm to the cerclage and 0.7 cm after the cerclage to the external os given a shortest best measurement of 1.4 cm in total.   In review of the prior exam on 6/13 the cervix appears the same.  I discussed with Anne Whitaker todays exam. She reports that she feels better. On examination her abdomen is soft and nontender.  I further conveyed today's findings and reviewed managment options which included expectant management with continue nightly vaginal progesterone vs cerclage revision. I discussed the increased risk for membrane rupture with cerclage revision given the location she wasn't keen on that particular risk.  However, she felt overall good and desired to go home.  I recommend that she repeat CL in 1 week. Continue to mangement clinically and to continue vaginal progesterone.  I discussed the plan of care with Dr. Henderson Cloud who was in agreement as well as Anne Whitaker.   All questions answered.  I spent 30 minutes with > 50% in face to face consultation with Anne Whitaker.  Novella Olive, MD.

## 2021-08-21 NOTE — Progress Notes (Signed)
Comfortable No pain, no bleeding, no leaking  Today's Vitals   08/21/21 1037 08/21/21 1103 08/21/21 1149 08/21/21 1304  BP:  106/61    Pulse:  65    Resp:  16    Temp:  (!) 97.3 F (36.3 C)    TempSrc:  Oral    SpO2:  100%    Weight:      Height:      PainSc: 0-No pain  0-No pain 0-No pain   Body mass index is 26.2 kg/m.   FHT + accels UCs irritability off/on  MFM U/S report and consult pending  A/P: NST qshift or prn UCs         D/W patient

## 2021-08-22 LAB — CULTURE, BETA STREP (GROUP B ONLY)

## 2021-08-23 ENCOUNTER — Ambulatory Visit: Payer: 59 | Admitting: *Deleted

## 2021-08-23 ENCOUNTER — Ambulatory Visit: Payer: 59 | Attending: Obstetrics and Gynecology

## 2021-08-23 ENCOUNTER — Other Ambulatory Visit: Payer: Self-pay | Admitting: *Deleted

## 2021-08-23 ENCOUNTER — Encounter: Payer: Self-pay | Admitting: *Deleted

## 2021-08-23 VITALS — BP 94/56 | HR 58

## 2021-08-23 DIAGNOSIS — Z8759 Personal history of other complications of pregnancy, childbirth and the puerperium: Secondary | ICD-10-CM

## 2021-08-23 DIAGNOSIS — O09212 Supervision of pregnancy with history of pre-term labor, second trimester: Secondary | ICD-10-CM

## 2021-08-23 DIAGNOSIS — O26872 Cervical shortening, second trimester: Secondary | ICD-10-CM | POA: Diagnosis not present

## 2021-08-23 DIAGNOSIS — Z3A23 23 weeks gestation of pregnancy: Secondary | ICD-10-CM | POA: Diagnosis not present

## 2021-08-23 DIAGNOSIS — Z362 Encounter for other antenatal screening follow-up: Secondary | ICD-10-CM | POA: Insufficient documentation

## 2021-08-23 DIAGNOSIS — O3432 Maternal care for cervical incompetence, second trimester: Secondary | ICD-10-CM | POA: Diagnosis not present

## 2021-08-23 DIAGNOSIS — O09892 Supervision of other high risk pregnancies, second trimester: Secondary | ICD-10-CM | POA: Diagnosis present

## 2021-09-20 ENCOUNTER — Ambulatory Visit: Payer: 59 | Admitting: *Deleted

## 2021-09-20 ENCOUNTER — Ambulatory Visit: Payer: 59 | Attending: Maternal & Fetal Medicine

## 2021-09-20 ENCOUNTER — Encounter: Payer: Self-pay | Admitting: *Deleted

## 2021-09-20 ENCOUNTER — Other Ambulatory Visit: Payer: Self-pay | Admitting: *Deleted

## 2021-09-20 VITALS — BP 118/51 | HR 80

## 2021-09-20 DIAGNOSIS — O3432 Maternal care for cervical incompetence, second trimester: Secondary | ICD-10-CM | POA: Insufficient documentation

## 2021-09-20 DIAGNOSIS — Z3A27 27 weeks gestation of pregnancy: Secondary | ICD-10-CM | POA: Diagnosis not present

## 2021-09-20 DIAGNOSIS — Z8759 Personal history of other complications of pregnancy, childbirth and the puerperium: Secondary | ICD-10-CM | POA: Insufficient documentation

## 2021-09-20 DIAGNOSIS — Z3689 Encounter for other specified antenatal screening: Secondary | ICD-10-CM | POA: Diagnosis present

## 2021-09-20 DIAGNOSIS — O09899 Supervision of other high risk pregnancies, unspecified trimester: Secondary | ICD-10-CM

## 2021-09-20 DIAGNOSIS — O09212 Supervision of pregnancy with history of pre-term labor, second trimester: Secondary | ICD-10-CM | POA: Diagnosis not present

## 2021-10-11 ENCOUNTER — Ambulatory Visit: Payer: 59 | Admitting: *Deleted

## 2021-10-11 ENCOUNTER — Ambulatory Visit (HOSPITAL_BASED_OUTPATIENT_CLINIC_OR_DEPARTMENT_OTHER): Payer: 59 | Admitting: Maternal & Fetal Medicine

## 2021-10-11 ENCOUNTER — Other Ambulatory Visit: Payer: Self-pay | Admitting: Obstetrics

## 2021-10-11 ENCOUNTER — Other Ambulatory Visit: Payer: Self-pay | Admitting: *Deleted

## 2021-10-11 ENCOUNTER — Ambulatory Visit: Payer: 59 | Attending: Obstetrics and Gynecology

## 2021-10-11 VITALS — BP 104/59 | HR 83

## 2021-10-11 DIAGNOSIS — O365990 Maternal care for other known or suspected poor fetal growth, unspecified trimester, not applicable or unspecified: Secondary | ICD-10-CM | POA: Diagnosis not present

## 2021-10-11 DIAGNOSIS — O09899 Supervision of other high risk pregnancies, unspecified trimester: Secondary | ICD-10-CM | POA: Diagnosis present

## 2021-10-11 DIAGNOSIS — O365931 Maternal care for other known or suspected poor fetal growth, third trimester, fetus 1: Secondary | ICD-10-CM

## 2021-10-11 DIAGNOSIS — O3433 Maternal care for cervical incompetence, third trimester: Secondary | ICD-10-CM | POA: Insufficient documentation

## 2021-10-11 DIAGNOSIS — O365930 Maternal care for other known or suspected poor fetal growth, third trimester, not applicable or unspecified: Secondary | ICD-10-CM

## 2021-10-11 DIAGNOSIS — Z3A30 30 weeks gestation of pregnancy: Secondary | ICD-10-CM | POA: Diagnosis not present

## 2021-10-11 DIAGNOSIS — O09213 Supervision of pregnancy with history of pre-term labor, third trimester: Secondary | ICD-10-CM | POA: Diagnosis not present

## 2021-10-11 NOTE — Progress Notes (Signed)
MFM Consult Note Patient Name: Anne Whitaker [facility]  Patient MRN:   [id number]  Referring provider: [facility] Reason for Consult: New onset fetal growth restriction  HPI: Anne Whitaker is a 31 y.o. G3P0110 at [redacted]w[redacted]d by LMP consistent with early [place] here for a growth ultrasound due to poor fetal growth.  Previously the growth percentile was in the 12% overall.  Now the estimated fetal weight is in the 11th percentile and the abdominal circumference is at the 8th percentile.  Normal fluid and umbilical artery Dopplers were seen today.  Normal movement, breathing and tone was noted although a formal BPP was not done due to early gestational age.  Her pregnancy is also complicated by history of preterm birth and a short cervix with a cerclage in place.  Review of Systems: A review of systems was performed and was negative except per HPI.  Vitals Blood pressure: 104/5 9, Pulse: 83, Prepregnancy BMI: 25 Genetic testing: Low risk and IPS/normal AFP  Assessment FGR - new onset w/ normal dopplers (EFW 11%, AC 8%)  Counseling I discussed today's visit with a diagnosis of IUGR. I explained that the etiology includes placental insufficiency, chronic disease, infection, aneuploidy and other genetic syndromes. She has a low risk NIPS. She has no additional risk factors for chronic disease. At this time I explained the diagnosis, evaluation and management to include on going fetal growth and weekly antenatal testing starting at 32w to include UA Dopplers. If the EFW < 3rd% or abnormal testing, I recommend delivery at 37 weeks otherwise if all is normal consider delivery at 38-39 weeks.   Recommendations Weekly umbilical artery Dopplers and biophysical profile starting at 32 weeks until delivery Serial growth ultrasounds every 3 weeks until delivery Delivery around 38 to 39 weeks unless the umbilical artery Dopplers or antenatal testing become abnormal or the overall fetal weight or abdominal  circumference are less than the 3rd percentile, then delivery is indicated at 37 weeks or possibly sooner based on the clinical scenario.  4. Amniocentesis for microarrary and CMV was offered but declined.   I spent 20 minutes reviewing the patients chart, including labs and images as well as counseling the patient about her medical conditions.  She had time to ask questions which were answered to her satisfaction. She verbalized understanding and request to proceed with the plan outlined above.   Braxton Feathers  MFM, [facility]   10/11/2021  3:37 PM

## 2021-10-25 ENCOUNTER — Ambulatory Visit: Payer: 59 | Attending: Maternal & Fetal Medicine

## 2021-10-25 ENCOUNTER — Encounter: Payer: Self-pay | Admitting: *Deleted

## 2021-10-25 ENCOUNTER — Ambulatory Visit: Payer: 59 | Admitting: *Deleted

## 2021-10-25 DIAGNOSIS — Z3A32 32 weeks gestation of pregnancy: Secondary | ICD-10-CM

## 2021-10-25 DIAGNOSIS — O3433 Maternal care for cervical incompetence, third trimester: Secondary | ICD-10-CM

## 2021-10-25 DIAGNOSIS — O09213 Supervision of pregnancy with history of pre-term labor, third trimester: Secondary | ICD-10-CM | POA: Diagnosis not present

## 2021-10-25 DIAGNOSIS — O365931 Maternal care for other known or suspected poor fetal growth, third trimester, fetus 1: Secondary | ICD-10-CM

## 2021-11-01 ENCOUNTER — Encounter: Payer: Self-pay | Admitting: *Deleted

## 2021-11-01 ENCOUNTER — Ambulatory Visit: Payer: 59 | Admitting: *Deleted

## 2021-11-01 ENCOUNTER — Ambulatory Visit: Payer: 59 | Attending: Obstetrics and Gynecology

## 2021-11-01 ENCOUNTER — Other Ambulatory Visit: Payer: Self-pay | Admitting: Maternal & Fetal Medicine

## 2021-11-01 DIAGNOSIS — O09219 Supervision of pregnancy with history of pre-term labor, unspecified trimester: Secondary | ICD-10-CM | POA: Insufficient documentation

## 2021-11-01 DIAGNOSIS — O365930 Maternal care for other known or suspected poor fetal growth, third trimester, not applicable or unspecified: Secondary | ICD-10-CM

## 2021-11-01 DIAGNOSIS — Z3A33 33 weeks gestation of pregnancy: Secondary | ICD-10-CM | POA: Diagnosis not present

## 2021-11-01 DIAGNOSIS — O3433 Maternal care for cervical incompetence, third trimester: Secondary | ICD-10-CM

## 2021-11-01 DIAGNOSIS — O365931 Maternal care for other known or suspected poor fetal growth, third trimester, fetus 1: Secondary | ICD-10-CM

## 2021-11-08 ENCOUNTER — Ambulatory Visit: Payer: 59 | Attending: Maternal & Fetal Medicine

## 2021-11-08 ENCOUNTER — Ambulatory Visit: Payer: 59 | Admitting: *Deleted

## 2021-11-08 VITALS — BP 108/64 | HR 77

## 2021-11-08 DIAGNOSIS — Z3A34 34 weeks gestation of pregnancy: Secondary | ICD-10-CM

## 2021-11-08 DIAGNOSIS — O365931 Maternal care for other known or suspected poor fetal growth, third trimester, fetus 1: Secondary | ICD-10-CM

## 2021-11-08 DIAGNOSIS — O365930 Maternal care for other known or suspected poor fetal growth, third trimester, not applicable or unspecified: Secondary | ICD-10-CM | POA: Diagnosis not present

## 2021-11-08 DIAGNOSIS — O09213 Supervision of pregnancy with history of pre-term labor, third trimester: Secondary | ICD-10-CM

## 2021-11-08 DIAGNOSIS — O3433 Maternal care for cervical incompetence, third trimester: Secondary | ICD-10-CM | POA: Diagnosis not present

## 2021-11-15 ENCOUNTER — Ambulatory Visit: Payer: 59 | Admitting: *Deleted

## 2021-11-15 ENCOUNTER — Ambulatory Visit (HOSPITAL_BASED_OUTPATIENT_CLINIC_OR_DEPARTMENT_OTHER): Payer: 59 | Admitting: *Deleted

## 2021-11-15 ENCOUNTER — Ambulatory Visit: Payer: 59 | Attending: Maternal & Fetal Medicine

## 2021-11-15 VITALS — BP 100/56 | HR 80

## 2021-11-15 DIAGNOSIS — Z3A35 35 weeks gestation of pregnancy: Secondary | ICD-10-CM | POA: Insufficient documentation

## 2021-11-15 DIAGNOSIS — O365930 Maternal care for other known or suspected poor fetal growth, third trimester, not applicable or unspecified: Secondary | ICD-10-CM

## 2021-11-15 NOTE — Procedures (Signed)
Anne Whitaker [birth year] [redacted]w[redacted]d  Fetus A Non-Stress Test Interpretation for 11/15/21  Indication: IUGR  Fetal Heart Rate A Mode: External Baseline Rate (A): 140 bpm Variability: Moderate Accelerations: 15 x 15 Decelerations: None Multiple birth?: No  Uterine Activity Mode: Palpation, Toco Contraction Frequency (min): none Resting Tone Palpated: Relaxed  Interpretation (Fetal Testing) Nonstress Test Interpretation: Reactive Overall Impression: Reassuring for gestational age Comments: Dr. Annamaria Boots reviewed tracing

## 2021-11-22 ENCOUNTER — Ambulatory Visit: Payer: 59 | Attending: Maternal & Fetal Medicine

## 2021-11-22 ENCOUNTER — Ambulatory Visit: Payer: 59 | Admitting: *Deleted

## 2021-11-22 ENCOUNTER — Other Ambulatory Visit: Payer: Self-pay | Admitting: Maternal & Fetal Medicine

## 2021-11-22 VITALS — BP 111/58 | HR 75

## 2021-11-22 DIAGNOSIS — O09213 Supervision of pregnancy with history of pre-term labor, third trimester: Secondary | ICD-10-CM | POA: Diagnosis not present

## 2021-11-22 DIAGNOSIS — O365931 Maternal care for other known or suspected poor fetal growth, third trimester, fetus 1: Secondary | ICD-10-CM

## 2021-11-22 DIAGNOSIS — O3433 Maternal care for cervical incompetence, third trimester: Secondary | ICD-10-CM

## 2021-11-22 DIAGNOSIS — Z3A36 36 weeks gestation of pregnancy: Secondary | ICD-10-CM | POA: Diagnosis not present

## 2021-11-22 DIAGNOSIS — O365930 Maternal care for other known or suspected poor fetal growth, third trimester, not applicable or unspecified: Secondary | ICD-10-CM

## 2021-11-30 ENCOUNTER — Encounter (HOSPITAL_COMMUNITY): Payer: Self-pay | Admitting: Obstetrics & Gynecology

## 2021-11-30 ENCOUNTER — Inpatient Hospital Stay (HOSPITAL_COMMUNITY)
Admission: AD | Admit: 2021-11-30 | Discharge: 2021-11-30 | Disposition: A | Discharge status: Home / Self Care | Payer: 59 | Attending: Obstetrics & Gynecology | Admitting: Obstetrics & Gynecology

## 2021-11-30 DIAGNOSIS — R110 Nausea: Secondary | ICD-10-CM | POA: Insufficient documentation

## 2021-11-30 DIAGNOSIS — O26893 Other specified pregnancy related conditions, third trimester: Secondary | ICD-10-CM | POA: Diagnosis not present

## 2021-11-30 DIAGNOSIS — R519 Headache, unspecified: Secondary | ICD-10-CM

## 2021-11-30 DIAGNOSIS — Z3A37 37 weeks gestation of pregnancy: Secondary | ICD-10-CM | POA: Diagnosis not present

## 2021-11-30 DIAGNOSIS — O368130 Decreased fetal movements, third trimester, not applicable or unspecified: Secondary | ICD-10-CM | POA: Diagnosis present

## 2021-11-30 DIAGNOSIS — Z3689 Encounter for other specified antenatal screening: Secondary | ICD-10-CM

## 2021-11-30 LAB — URINALYSIS, ROUTINE W REFLEX MICROSCOPIC
Bilirubin Urine: NEGATIVE
Glucose, UA: NEGATIVE mg/dL
Hgb urine dipstick: NEGATIVE
Ketones, ur: NEGATIVE mg/dL
Nitrite: NEGATIVE
Protein, ur: NEGATIVE mg/dL
Specific Gravity, Urine: 1.024 (ref 1.005–1.030)
pH: 6 (ref 5.0–8.0)

## 2021-11-30 MED ORDER — ACETAMINOPHEN 500 MG PO TABS
1000.0000 mg | ORAL_TABLET | Freq: Once | ORAL | Status: AC
  Administered 2021-11-30: 1000 mg via ORAL
  Filled 2021-11-30: qty 2

## 2021-11-30 MED ORDER — ONDANSETRON 4 MG PO TBDP
8.0000 mg | ORAL_TABLET | Freq: Once | ORAL | Status: AC
  Administered 2021-11-30: 8 mg via ORAL
  Filled 2021-11-30: qty 2

## 2021-11-30 NOTE — MAU Note (Signed)
.  Anne Whitaker is a 31 y.o. at [redacted]w[redacted]d here in MAU reporting: she had brown diascharge yesterday nw it is more bloody like a period. Stated she had ctx last night but today is just mild cramping. Also reports fetal movement less than normal only felt baby move 2-3 times today.  Pt reports she is having a headache and feels dizzy and nauseated. LMP:  Onset of complaint:  Pain score: Headache 7, Cramping 5-6  Vitals:   11/30/21 1853  BP: 105/67  Pulse: 100  Resp: 18  Temp: 98 F (36.7 C)     FHT:147 Lab orders placed from triage:  u/a

## 2021-11-30 NOTE — MAU Provider Note (Cosign Needed Addendum)
History     [id number]  Arrival date and time: 11/30/21 1834    Chief Complaint  Patient presents with   Vaginal Bleeding   Headache   Abdominal Pain     HPI Anne Whitaker is a 31 y.o. at [redacted]w[redacted]d who presents for decreased fetal movement, vaginal bleeding, & headache.  Reports frontal headache today. Worse with movement. Hasn't treated symptoms.  Rates headache 7/10. No history of hypertension, no visual disturbance or epigastric pain. Headache associated with nausea - no vomiting.  Reports abdominal cramping since yesterday that she rates 5/10.  Noticed brown & red vaginal spotting today. Only sees when she wipes. States she cerclage was removed in the office on Wednesday. Denies LOF or recent intercourse.  Noticed decreased fetal movement today. Had only felt baby move twice prior to coming to MAU.   OB History     Gravida  3   Para  1   Term  0   Preterm  1   AB  1   Living  0      SAB  0   IAB  1   Ectopic  0   Multiple  0   Live Births  1           Past Medical History:  Diagnosis Date   GERD (gastroesophageal reflux disease)    Left shoulder pain     Past Surgical History:  Procedure Laterality Date   CERVICAL CERCLAGE N/A 08/06/2021   Procedure: CERCLAGE CERVICAL;  Surgeon: Carlyon Shadow, MD;  Location: [facility];  Service: Gynecology;  Laterality: N/A;   COLONOSCOPY  02/13/2020   UPPER GASTROINTESTINAL ENDOSCOPY  02/13/2020    Family History  Problem Relation Age of Onset   Colon cancer Maternal Aunt    Colon cancer Paternal Aunt    Heart disease Maternal Grandmother    Hypertension Maternal Grandfather    Diabetes Maternal Grandfather    Cancer Maternal Grandfather    Brain cancer Maternal Grandfather    Hypertension Paternal Grandmother    Diabetes Paternal Grandmother    Cancer Paternal Grandmother    Stomach cancer Paternal Grandmother    Esophageal cancer Neg Hx    Rectal cancer Neg Hx    Stroke Neg Hx     No  Known Allergies  No current facility-administered medications on file prior to encounter.   Current Outpatient Medications on File Prior to Encounter  Medication Sig Dispense Refill   Multiple Vitamin (MULTIVITAMIN) tablet Take 1 tablet by mouth daily.     progesterone 200 MG SUPP Place 200 mg vaginally at bedtime.       ROS Pertinent positives and negative per HPI, all others reviewed and negative  Physical Exam   BP 111/67 (BP Location: Right Arm)   Pulse 93   Temp 98 F (36.7 C)   Resp 17   Ht 5\' 3"  (1.6 m)   Wt 83.5 kg   LMP 03/13/2021   SpO2 95%   BMI 32.59 kg/m   Patient Vitals for the past 24 hrs:  BP Temp Pulse Resp SpO2 Height Weight  11/30/21 1920 111/67 -- 93 17 95 % -- --  11/30/21 1910 -- -- -- -- 95 % -- --  11/30/21 1853 105/67 98 F (36.7 C) 100 18 -- 5\' 3"  (1.6 m) 83.5 kg    Physical Exam Vitals and nursing note reviewed. Exam conducted with a chaperone present.  Constitutional:      General: She is not  in acute distress.    Appearance: She is well-developed.  Pulmonary:     Effort: Pulmonary effort is normal. No respiratory distress.  Genitourinary:    General: Normal vulva.     Comments: Scant tan discharge. No blood.  Skin:    General: Skin is warm and dry.  Neurological:     Mental Status: She is alert.  Psychiatric:        Mood and Affect: Mood normal.        Behavior: Behavior normal.     Cervical Exam Dilation: 1.5 Effacement (%): 50 Cervical Position: Posterior Station: -3 Presentation: Vertex Exam by:: Judeth Horn NP   FHT Baseline 145, moderate variability, 15x15 accels, no decels Toco: UI & irregular ctx Cat: 1  Labs No results found for this or any previous visit (from the past 24 hour(s)).  Imaging No results found.  MAU Course  Procedures Lab Orders         Urinalysis, Routine w reflex microscopic Urine, Clean Catch    Meds ordered this encounter  Medications   ondansetron (ZOFRAN-ODT) disintegrating  tablet 8 mg   acetaminophen (TYLENOL) tablet 1,000 mg   Imaging Orders  No imaging studies ordered today    MDM Exam performed. Scan tan discharge. No blood. Cervix 1.5/50. Suspect spotting she was from recent cerclage removal & exam in office. She is RH positive.   Decreased fetal movement - patient reports return of good movement since coming to MAU. Reactive fetal tracing.   Headache - She is normotensive. Hasn't treated h/a prior to arrival. Will give tylenol (will premedicate with zofran due to nausea).   Care turned over to [facility] CNM Judeth Horn, NP  11/30/2021 8:10 PM  Assessment and Plan   Reassessment (9:05 PM) -Nurse reports patient HA now 1/10. -Patient declines cervical recheck. -Nurse instructed to give precautions. -Encouraged to call primary office or return to MAU if symptoms worsen or with the onset of new symptoms. -Discharged to home in improved condition.  Cherre Robins MSN, CNM Advanced Practice Provider, [facility] [employer]

## 2021-12-02 LAB — OB RESULTS CONSOLE GBS: GBS: NEGATIVE

## 2021-12-16 ENCOUNTER — Inpatient Hospital Stay (HOSPITAL_COMMUNITY): Payer: 59

## 2021-12-16 ENCOUNTER — Encounter (HOSPITAL_COMMUNITY): Payer: Self-pay | Admitting: Obstetrics and Gynecology

## 2021-12-16 ENCOUNTER — Other Ambulatory Visit: Payer: Self-pay

## 2021-12-16 ENCOUNTER — Inpatient Hospital Stay (HOSPITAL_COMMUNITY): Payer: 59 | Admitting: Anesthesiology

## 2021-12-16 ENCOUNTER — Inpatient Hospital Stay (HOSPITAL_COMMUNITY)
Admission: AD | Admit: 2021-12-16 | Discharge: 2021-12-18 | DRG: 807 | Disposition: A | Discharge status: Home / Self Care | Payer: 59 | Attending: Obstetrics and Gynecology | Admitting: Obstetrics and Gynecology

## 2021-12-16 DIAGNOSIS — Z3A39 39 weeks gestation of pregnancy: Secondary | ICD-10-CM

## 2021-12-16 DIAGNOSIS — Z3490 Encounter for supervision of normal pregnancy, unspecified, unspecified trimester: Secondary | ICD-10-CM

## 2021-12-16 DIAGNOSIS — O26893 Other specified pregnancy related conditions, third trimester: Secondary | ICD-10-CM | POA: Diagnosis present

## 2021-12-16 LAB — CBC
HCT: 37.1 % (ref 36.0–46.0)
Hemoglobin: 12.7 g/dL (ref 12.0–15.0)
MCH: 32.8 pg (ref 26.0–34.0)
MCHC: 34.2 g/dL (ref 30.0–36.0)
MCV: 95.9 fL (ref 80.0–100.0)
Platelets: 251 10*3/uL (ref 150–400)
RBC: 3.87 MIL/uL (ref 3.87–5.11)
RDW: 14.6 % (ref 11.5–15.5)
WBC: 8.3 10*3/uL (ref 4.0–10.5)
nRBC: 0 % (ref 0.0–0.2)

## 2021-12-16 LAB — TYPE AND SCREEN
ABO/RH(D): O POS
Antibody Screen: NEGATIVE

## 2021-12-16 MED ORDER — EPHEDRINE 5 MG/ML INJ: 10.0000 mg | INTRAVENOUS | Status: DC | PRN

## 2021-12-16 MED ORDER — LACTATED RINGERS IV SOLN
500.0000 mL | INTRAVENOUS | Status: DC | PRN
  Administered 2021-12-16 (×2): 500 mL via INTRAVENOUS

## 2021-12-16 MED ORDER — LIDOCAINE HCL (PF) 1 % IJ SOLN
INTRAMUSCULAR | Status: DC | PRN
  Administered 2021-12-16 (×2): 4 mL via EPIDURAL

## 2021-12-16 MED ORDER — TERBUTALINE SULFATE 1 MG/ML IJ SOLN: 0.2500 mg | Freq: Once | INTRAMUSCULAR | Status: DC | PRN

## 2021-12-16 MED ORDER — PHENYLEPHRINE 80 MCG/ML (10ML) SYRINGE FOR IV PUSH (FOR BLOOD PRESSURE SUPPORT): 80.0000 ug | PREFILLED_SYRINGE | INTRAVENOUS | Status: DC | PRN

## 2021-12-16 MED ORDER — DIPHENHYDRAMINE HCL 50 MG/ML IJ SOLN: 12.5000 mg | INTRAMUSCULAR | Status: DC | PRN

## 2021-12-16 MED ORDER — SOD CITRATE-CITRIC ACID 500-334 MG/5ML PO SOLN: 30.0000 mL | ORAL | Status: DC | PRN

## 2021-12-16 MED ORDER — ACETAMINOPHEN 325 MG PO TABS
650.0000 mg | ORAL_TABLET | ORAL | Status: DC | PRN
  Administered 2021-12-17: 650 mg via ORAL
  Filled 2021-12-16: qty 2

## 2021-12-16 MED ORDER — LIDOCAINE HCL (PF) 1 % IJ SOLN: 30.0000 mL | INTRAMUSCULAR | Status: DC | PRN

## 2021-12-16 MED ORDER — OXYCODONE-ACETAMINOPHEN 5-325 MG PO TABS: 2.0000 | ORAL_TABLET | ORAL | Status: DC | PRN

## 2021-12-16 MED ORDER — HYDROXYZINE HCL 50 MG PO TABS: 50.0000 mg | ORAL_TABLET | Freq: Four times a day (QID) | ORAL | Status: DC | PRN

## 2021-12-16 MED ORDER — OXYTOCIN BOLUS FROM INFUSION
333.0000 mL | Freq: Once | INTRAVENOUS | Status: AC
  Administered 2021-12-17: 333 mL via INTRAVENOUS

## 2021-12-16 MED ORDER — OXYTOCIN-SODIUM CHLORIDE 30-0.9 UT/500ML-% IV SOLN
2.5000 [IU]/h | INTRAVENOUS | Status: DC
  Administered 2021-12-17: 2.5 [IU]/h via INTRAVENOUS

## 2021-12-16 MED ORDER — FENTANYL-BUPIVACAINE-NACL 0.5-0.125-0.9 MG/250ML-% EP SOLN
12.0000 mL/h | EPIDURAL | Status: DC | PRN
  Administered 2021-12-16: 12 mL/h via EPIDURAL
  Filled 2021-12-16: qty 250

## 2021-12-16 MED ORDER — OXYCODONE-ACETAMINOPHEN 5-325 MG PO TABS: 1.0000 | ORAL_TABLET | ORAL | Status: DC | PRN

## 2021-12-16 MED ORDER — LACTATED RINGERS IV SOLN
500.0000 mL | Freq: Once | INTRAVENOUS | Status: AC
  Administered 2021-12-16: 500 mL via INTRAVENOUS

## 2021-12-16 MED ORDER — LACTATED RINGERS IV SOLN: INTRAVENOUS | Status: DC

## 2021-12-16 MED ORDER — ONDANSETRON HCL 4 MG/2ML IJ SOLN
4.0000 mg | Freq: Four times a day (QID) | INTRAMUSCULAR | Status: DC | PRN
  Administered 2021-12-16 (×2): 4 mg via INTRAVENOUS
  Filled 2021-12-16 (×2): qty 2

## 2021-12-16 MED ORDER — OXYTOCIN-SODIUM CHLORIDE 30-0.9 UT/500ML-% IV SOLN
1.0000 m[IU]/min | INTRAVENOUS | Status: DC
  Administered 2021-12-16: 2 m[IU]/min via INTRAVENOUS
  Filled 2021-12-16: qty 500

## 2021-12-16 NOTE — Progress Notes (Signed)
Labor Progress Note  Patient doing well, put currently 10 mU/min.  Epidural in place and effective.  FHT cat 1.    Cervix 4-5 / 100 / -2, head well applied.  AROM performed in typical fashion with return of clear fluid.  Alpha Gula MD

## 2021-12-16 NOTE — Anesthesia Procedure Notes (Signed)
Epidural Patient location during procedure: OB Start time: 12/16/2021 3:23 PM End time: 12/16/2021 3:26 PM  Staffing Anesthesiologist: Brennan Bailey, MD Performed: anesthesiologist   Preanesthetic Checklist Completed: patient identified, IV checked, risks and benefits discussed, monitors and equipment checked, pre-op evaluation and timeout performed  Epidural Patient position: sitting Prep: DuraPrep and site prepped and draped Patient monitoring: continuous pulse ox, blood pressure and heart rate Approach: midline Location: L3-L4 Injection technique: LOR air  Needle:  Needle type: Tuohy  Needle gauge: 17 G Needle length: 9 cm Needle insertion depth: 5 cm Catheter type: closed end flexible Catheter size: 19 Gauge Catheter at skin depth: 10 cm Test dose: negative and Other (1% lidocaine)  Assessment Events: blood not aspirated, injection not painful, no injection resistance, no paresthesia and negative IV test  Additional Notes Patient identified. Risks, benefits, and alternatives discussed with patient including but not limited to bleeding, infection, nerve damage, paralysis, failed block, incomplete pain control, headache, blood pressure changes, nausea, vomiting, reactions to medication, itching, and postpartum back pain. Confirmed with bedside nurse the patient's most recent platelet count. Confirmed with patient that they are not currently taking any anticoagulation, have any bleeding history, or any family history of bleeding disorders. Patient expressed understanding and wished to proceed. All questions were answered. Sterile technique was used throughout the entire procedure. Please see nursing notes for vital signs.   Crisp LOR on first pass. Test dose was given through epidural catheter and negative prior to continuing to dose epidural or start infusion. Warning signs of high block given to the patient including shortness of breath, tingling/numbness in hands, complete  motor block, or any concerning symptoms with instructions to call for help. Patient was given instructions on fall risk and not to get out of bed. All questions and concerns addressed with instructions to call with any issues or inadequate analgesia.  Reason for block:procedure for pain

## 2021-12-16 NOTE — Anesthesia Preprocedure Evaluation (Signed)
Anesthesia Evaluation  Patient identified by MRN, date of birth, ID band Patient awake    Reviewed: Allergy & Precautions, Patient's Chart, lab work & pertinent test results  History of Anesthesia Complications Negative for: history of anesthetic complications  Airway Mallampati: II  TM Distance: >3 FB Neck ROM: Full    Dental no notable dental hx.    Pulmonary neg pulmonary ROS,    Pulmonary exam normal        Cardiovascular negative cardio ROS Normal cardiovascular exam     Neuro/Psych negative neurological ROS  negative psych ROS   GI/Hepatic Neg liver ROS, GERD  ,  Endo/Other  negative endocrine ROS  Renal/GU negative Renal ROS  negative genitourinary   Musculoskeletal negative musculoskeletal ROS (+)   Abdominal   Peds  Hematology negative hematology ROS (+)   Anesthesia Other Findings Day of surgery medications reviewed with patient.  Reproductive/Obstetrics (+) Pregnancy                             Anesthesia Physical Anesthesia Plan  ASA: 2  Anesthesia Plan: Epidural   Post-op Pain Management:    Induction:   PONV Risk Score and Plan: Treatment may vary due to age or medical condition  Airway Management Planned: Natural Airway  Additional Equipment: Fetal Monitoring  Intra-op Plan:   Post-operative Plan:   Informed Consent: I have reviewed the patients History and Physical, chart, labs and discussed the procedure including the risks, benefits and alternatives for the proposed anesthesia with the patient or authorized representative who has indicated his/her understanding and acceptance.       Plan Discussed with:   Anesthesia Plan Comments:         Anesthesia Quick Evaluation

## 2021-12-16 NOTE — H&P (Signed)
OB History and Physical   Anne Whitaker is a 31 y.o. female G3P0110 presenting for IOL at [redacted]w[redacted]d.  Pregnancy course notable for history of 22 week delivery (baby did not survive), she underwent placement of cerclage during current pregnancy, which was removed on 10/3 and cervix.  Cervix has most recently been 3/90/-2 in office.  During prenatal care, she was followed by MFM for IUGR that resolved.  She continued weekly antenatal testing. MFM [place] on 9/29 was with EFW 56%ile.  Rh positive, GBS negative.  Panorama low risk.    OB History     Gravida  3   Para  1   Term  0   Preterm  1   AB  1   Living  0      SAB  0   IAB  1   Ectopic  0   Multiple  0   Live Births  1          Past Medical History:  Diagnosis Date   GERD (gastroesophageal reflux disease)    Left shoulder pain    Past Surgical History:  Procedure Laterality Date   CERVICAL CERCLAGE N/A 08/06/2021   Procedure: CERCLAGE CERVICAL;  Surgeon: Carlyon Shadow, MD;  Location: [facility] LD [facility]S;  Service: Gynecology;  Laterality: N/A;   COLONOSCOPY  02/13/2020   UPPER GASTROINTESTINAL ENDOSCOPY  02/13/2020   Family History: family history includes Brain cancer in her maternal grandfather; Cancer in her maternal grandfather and paternal grandmother; Colon cancer in her maternal aunt and paternal aunt; Diabetes in her maternal grandfather and paternal grandmother; Heart disease in her maternal grandmother; Hypertension in her maternal grandfather and paternal grandmother; Stomach cancer in her paternal grandmother. Social History:  reports that she has never smoked. She has never used smokeless tobacco. She reports that she does not drink alcohol and does not use drugs.     Maternal Diabetes: No Genetic Screening: Normal Maternal Ultrasounds/Referrals: Normal Fetal Ultrasounds or other Referrals:  IUGR followed by MFM, which resolved.  Maternal Substance Abuse:  No Significant Maternal Medications:   None Significant Maternal Lab Results:  Group B Strep negative Other Comments:  None  Review of Systems - Patient denies fever, chills, SOB, CP, N/V/D.  History Dilation: 4 Effacement (%): 100 Station: 0 Exam by:: Y. Manhard RN Blood pressure 103/60, pulse 68, temperature 98.3 F (36.8 C), temperature source Oral, height 5\' 3"  (1.6 m), weight 83.5 kg, last menstrual period 03/13/2021, unknown if currently breastfeeding. Exam Physical Exam   Gen: alert, well appearing, no distress Chest: nonlabored breathing CV: no peripheral edema Abdomen: soft, gravid  Ext: no evidence of DVT  Prenatal labs: ABO, Rh: --/--/PENDING (10/23 1405) Antibody: PENDING (10/23 1405) Rubella: Immune (04/10 0000) RPR: NON REACTIVE (06/13 1218)  HBsAg: Negative (04/10 0000)  HIV: Non-reactive (04/10 0000)  GBS: Negative/-- (10/09 0000)   Assessment/Plan: Admit to Labor and Delivery pitocin, AROM for IOL Epidural when desired Anticipate vaginal delivery   Carlyon Shadow 12/16/2021, 2:50 PM

## 2021-12-16 NOTE — Progress Notes (Signed)
Labor Progress Note  Ctx increasingly frequent and at 21:58 prolonged deceleration noted (baseline 120 to baseline 90, lasting 3-4 minutes).  This was followed by series of late vs early decels.  Pitocin discontinued d/t tachysystole and position changes have resolved decels to 110-115, moderate variability.  Cervix is complete with 0 to -1 station.   Continue close FHT monitoring, hopeful for continued progression towards SVD.  Alpha Gula

## 2021-12-17 ENCOUNTER — Encounter (HOSPITAL_COMMUNITY): Payer: Self-pay | Admitting: Obstetrics and Gynecology

## 2021-12-17 LAB — CBC
HCT: 29.3 % — ABNORMAL LOW (ref 36.0–46.0)
Hemoglobin: 10.1 g/dL — ABNORMAL LOW (ref 12.0–15.0)
MCH: 32.7 pg (ref 26.0–34.0)
MCHC: 34.5 g/dL (ref 30.0–36.0)
MCV: 94.8 fL (ref 80.0–100.0)
Platelets: 187 10*3/uL (ref 150–400)
RBC: 3.09 MIL/uL — ABNORMAL LOW (ref 3.87–5.11)
RDW: 14.6 % (ref 11.5–15.5)
WBC: 11.1 10*3/uL — ABNORMAL HIGH (ref 4.0–10.5)
nRBC: 0 % (ref 0.0–0.2)

## 2021-12-17 LAB — RPR: RPR Ser Ql: NONREACTIVE

## 2021-12-17 MED ORDER — DIBUCAINE (PERIANAL) 1 % EX OINT: 1.0000 | TOPICAL_OINTMENT | CUTANEOUS | Status: DC | PRN

## 2021-12-17 MED ORDER — DIPHENHYDRAMINE HCL 25 MG PO CAPS: 25.0000 mg | ORAL_CAPSULE | Freq: Four times a day (QID) | ORAL | Status: DC | PRN

## 2021-12-17 MED ORDER — IBUPROFEN 600 MG PO TABS
600.0000 mg | ORAL_TABLET | Freq: Four times a day (QID) | ORAL | Status: DC
  Administered 2021-12-17 – 2021-12-18 (×7): 600 mg via ORAL
  Filled 2021-12-17 (×7): qty 1

## 2021-12-17 MED ORDER — WITCH HAZEL-GLYCERIN EX PADS: 1.0000 | MEDICATED_PAD | CUTANEOUS | Status: DC | PRN

## 2021-12-17 MED ORDER — COCONUT OIL OIL: 1.0000 | TOPICAL_OIL | Status: DC | PRN

## 2021-12-17 MED ORDER — ZOLPIDEM TARTRATE 5 MG PO TABS: 5.0000 mg | ORAL_TABLET | Freq: Every evening | ORAL | Status: DC | PRN

## 2021-12-17 MED ORDER — ONDANSETRON HCL 4 MG PO TABS: 4.0000 mg | ORAL_TABLET | ORAL | Status: DC | PRN

## 2021-12-17 MED ORDER — ONDANSETRON HCL 4 MG/2ML IJ SOLN: 4.0000 mg | INTRAMUSCULAR | Status: DC | PRN

## 2021-12-17 MED ORDER — GENTAMICIN SULFATE 40 MG/ML IJ SOLN
5.0000 mg/kg | Freq: Once | INTRAVENOUS | Status: AC
  Administered 2021-12-17: 420 mg via INTRAVENOUS
  Filled 2021-12-17: qty 10.5

## 2021-12-17 MED ORDER — ACETAMINOPHEN 325 MG PO TABS
650.0000 mg | ORAL_TABLET | ORAL | Status: DC | PRN
  Administered 2021-12-17 – 2021-12-18 (×3): 650 mg via ORAL
  Filled 2021-12-17 (×3): qty 2

## 2021-12-17 MED ORDER — PRENATAL MULTIVITAMIN CH
1.0000 | ORAL_TABLET | Freq: Every day | ORAL | Status: DC
  Administered 2021-12-17 – 2021-12-18 (×2): 1 via ORAL
  Filled 2021-12-17 (×2): qty 1

## 2021-12-17 MED ORDER — BENZOCAINE-MENTHOL 20-0.5 % EX AERO
1.0000 | INHALATION_SPRAY | CUTANEOUS | Status: DC | PRN
  Administered 2021-12-17: 1 via TOPICAL
  Filled 2021-12-17: qty 56

## 2021-12-17 MED ORDER — SENNOSIDES-DOCUSATE SODIUM 8.6-50 MG PO TABS
2.0000 | ORAL_TABLET | ORAL | Status: DC
  Administered 2021-12-17 – 2021-12-18 (×2): 2 via ORAL
  Filled 2021-12-17 (×2): qty 2

## 2021-12-17 MED ORDER — OXYCODONE HCL 5 MG PO TABS
5.0000 mg | ORAL_TABLET | Freq: Four times a day (QID) | ORAL | Status: DC | PRN
  Administered 2021-12-17 – 2021-12-18 (×3): 5 mg via ORAL
  Filled 2021-12-17 (×3): qty 1

## 2021-12-17 MED ORDER — TETANUS-DIPHTH-ACELL PERTUSSIS 5-2.5-18.5 LF-MCG/0.5 IM SUSY: 0.5000 mL | PREFILLED_SYRINGE | Freq: Once | INTRAMUSCULAR | Status: DC

## 2021-12-17 MED ORDER — SIMETHICONE 80 MG PO CHEW: 80.0000 mg | CHEWABLE_TABLET | ORAL | Status: DC | PRN

## 2021-12-17 MED ORDER — SODIUM CHLORIDE 0.9 % IV SOLN
2.0000 g | Freq: Once | INTRAVENOUS | Status: AC
  Administered 2021-12-17: 2 g via INTRAVENOUS
  Filled 2021-12-17: qty 2000

## 2021-12-17 NOTE — Anesthesia Postprocedure Evaluation (Signed)
Anesthesia Post Note  Patient: Anne Whitaker  Procedure(s) Performed: AN AD HOC LABOR EPIDURAL     Patient location during evaluation: Mother Baby Anesthesia Type: Epidural Level of consciousness: awake and alert and oriented Pain management: satisfactory to patient Vital Signs Assessment: post-procedure vital signs reviewed and stable Respiratory status: respiratory function stable Cardiovascular status: stable Postop Assessment: no headache, no backache, epidural receding, patient able to bend at knees, no signs of nausea or vomiting and adequate PO intake Anesthetic complications: no   No notable events documented.  Last Vitals:  Vitals:   12/17/21 0325 12/17/21 0725  BP: (!) 109/57 110/72  Pulse: 91 80  Resp: 18 18  Temp: 37.1 C 37 C  SpO2:      Last Pain:  Vitals:   12/17/21 0725  TempSrc: Oral  PainSc: 2    Pain Goal:                   Ken Bonn

## 2021-12-17 NOTE — Lactation Note (Signed)
This note was copied from a baby's chart. Lactation Consultation Note  Patient Name: Anne Whitaker Today's Date: 12/17/2021 Reason for consult: Initial assessment;Primapara;1st time breastfeeding;Term Age:33 hours   P1: Term infant at 39+6 weeks Feeding preference: Breast/formula  RN requested latch assistance.  Taught hand expression; one drop of colostrum finger fed to baby.  Assisted to latch, however, baby was on/off for 5 minutes.  Few good sucks noted.  Mother sensitive to latching and was having uterine contractions prior to latching.  RN had given pain medication.   Encouraged to feed 8-12 times/24 hours or sooner if baby shows cues.  Reviewed cues.  Offered to swaddle baby so parents could rest.  Suggested birth parent call her [place] when baby shows readiness.    Support person at bedside.  RN updated.   Maternal Data Has patient been taught Hand Expression?: Yes Does the patient have breastfeeding experience prior to this delivery?: No  Feeding Mother's Current Feeding Choice: Breast Milk and Formula  LATCH Score Latch: Repeated attempts needed to sustain latch, nipple held in mouth throughout feeding, stimulation needed to elicit sucking reflex.  Audible Swallowing: None  Type of Nipple: Everted at rest and after stimulation  Comfort (Breast/Nipple): Filling, red/small blisters or bruises, mild/mod discomfort  Hold (Positioning): Assistance needed to correctly position infant at breast and maintain latch.  LATCH Score: 5   Lactation Tools Discussed/Used    Interventions Interventions: Breast feeding basics reviewed;Assisted with latch;Skin to skin;Breast massage;Hand express;Breast compression;Position options;Support pillows;Adjust position;Education;LC Services brochure  Discharge    Consult Status Consult Status: Follow-up Date: 12/18/21 Follow-up type: In-patient    [name] 12/17/2021, 5:31 AM

## 2021-12-17 NOTE — Lactation Note (Signed)
This note was copied from a baby's chart. Lactation Consultation Note  Patient Name: Anne Whitaker [id number] Date: 12/17/2021 Reason for consult: Follow-up assessment;1st time breastfeeding;Term Age:33 hours Birth Parent feeding choice is breast and formula feeding. Birth Parent decline translator prefers to speak [place].  Per Birth Parent infant was spitty earlier today and not interested in latching at the breast.  Birth Parent latched infant on her right breast using the football hold position, infant briefly BF for 5 minutes was on and off breast. Afterwards infant was supplemented with 10 mls of formula using purple and clear slow flow bottle nipple infant was pace feed this was Birth Parent's choice to supplement infant.   Birth Parent feeding plan: 1- Birth Parent will continue to BF infant according to hunger cues, on demand, 8 to 12+ times within 24 hours, STS. 2- Birth Parent will continue to ask RN/LC for further latch assistance if needed. 3- After latching infant at the breast if Birth Parent chooses then will continue to supplement infant with formula.  Maternal Data Has patient been taught Hand Expression?: Yes Does the patient have breastfeeding experience prior to this delivery?: No  Feeding Mother's Current Feeding Choice: Breast Milk and Formula Nipple Type: Extra Slow Flow  LATCH Score Latch: Repeated attempts needed to sustain latch, nipple held in mouth throughout feeding, stimulation needed to elicit sucking reflex.  Audible Swallowing: A few with stimulation  Type of Nipple: Everted at rest and after stimulation  Comfort (Breast/Nipple): Soft / non-tender  Hold (Positioning): Assistance needed to correctly position infant at breast and maintain latch.  LATCH Score: 7   Lactation Tools Discussed/Used    Interventions Interventions: Breast feeding basics reviewed;Assisted with latch;Skin to skin;Breast compression;Adjust position;Support  pillows;Position options;Education;Pace feeding  Discharge    Consult Status Consult Status: Follow-up Date: 12/18/21 Follow-up type: In-patient    Vicente Serene 12/17/2021, 6:49 PM

## 2021-12-17 NOTE — Plan of Care (Signed)
  Problem: Education: Goal: Knowledge of condition will improve Outcome: Completed/Met

## 2021-12-17 NOTE — Lactation Note (Signed)
This note was copied from a baby's chart. Lactation Consultation Note  Patient Name: Anne Whitaker [id number] Date: 12/17/2021 Reason for consult: L&D Initial assessment;Term Age:33 hours Assisted baby to breast. Unable to latch in cradle position. Placed pillows for support and re-positioned baby in football hold. Compressed breast and hand expressed some colostrum and baby opened and latch w/a few suckles. Off and on frequently w/a few suckles. Mainly wanted to look and mom. Alert looking at her mom. Mom happy and smiling back at baby talking to her. Baby isn't interested in BF at this time. Encouraged to just snuggle and bond at this time. Mom will be seen on MBU. Mom will need shells and DEBP set up.  Maternal Data Has patient been taught Hand Expression?: Yes Does the patient have breastfeeding experience prior to this delivery?: No  Feeding    LATCH Score Latch: Too sleepy or reluctant, no latch achieved, no sucking elicited.  Audible Swallowing: None  Type of Nipple: Flat  Comfort (Breast/Nipple): Soft / non-tender  Hold (Positioning): Full assist, staff holds infant at breast  LATCH Score: 3   Lactation Tools Discussed/Used    Interventions Interventions: Adjust position;Assisted with latch;Support pillows;Skin to skin;Position options;Breast compression;Reverse pressure;Hand express  Discharge    Consult Status Consult Status: Follow-up from L&D Date: 12/17/21 Follow-up type: In-patient    [name], [name] 12/17/2021, 1:20 AM

## 2021-12-17 NOTE — Progress Notes (Signed)
Post Partum Day 0 Subjective: no complaints, up ad lib, voiding, and tolerating PO  Objective: Blood pressure 110/72, pulse 80, temperature 98.6 F (37 C), temperature source Oral, resp. rate 18, height 5\' 3"  (1.6 m), weight 83.5 kg, last menstrual period 03/13/2021, SpO2 100 %, unknown if currently breastfeeding.  Physical Exam:  General: alert, cooperative, and appears stated age Lochia: appropriate Uterine Fundus: firm Incision: healing well, no significant drainage DVT Evaluation: No evidence of DVT seen on physical exam. Negative Homan's sign. No cords or calf tenderness.  Recent Labs    12/16/21 1405 12/17/21 0603  HGB 12.7 10.1*  HCT 37.1 29.3*    Assessment/Plan: Plan for discharge tomorrow and Breastfeeding [place]-spiked T102.1 at 0139 immediately following delivery.  Patient received Amp/Gent x 1 dose and has been afebrile since.  Will CTM.   LOS: 1 day   Linda Hedges, DO 12/17/2021, 9:48 AM

## 2021-12-18 LAB — SURGICAL PATHOLOGY

## 2021-12-18 NOTE — Discharge Summary (Signed)
Postpartum Discharge Summary      Patient Name: Anne Whitaker DOB: 05-30-1990 MRN: [id number]  Date of admission: 12/16/2021 Delivery date:12/17/2021  Delivering provider: Eyvonne Mechanic A  Date of discharge: 12/18/2021  Admitting diagnosis: Pregnancy [Z34.90] Intrauterine pregnancy: [redacted]w[redacted]d    Secondary diagnosis:  Principal Problem:   Pregnancy  Additional problems: None    Discharge diagnosis: Term Pregnancy Delivered                                              Post partum procedures: None Augmentation: AROM and Pitocin Complications: None  Hospital course: Induction of Labor With Vaginal Delivery   31y.o. yo [id number] admitted to the hospital 12/16/2021 for induction of labor.  Indication for induction: Favorable cervix at term.  Patient had an labor course complicated byNone Membrane Rupture Time/Date: 6:17 PM ,12/16/2021   Delivery Method:Vaginal, Spontaneous  Episiotomy: None  Lacerations:  2nd degree  Details of delivery can be found in separate delivery note.  Patient had a postpartum course complicated byNone. Patient is discharged home 12/18/21.  Newborn Data: Birth date:12/17/2021  Birth time:12:23 AM  Gender:Female  Living status:Living  Apgars:8 ,9  Weight:3430 g   Magnesium Sulfate received: No BMZ received: No Rhophylac:N/A MMR:N/A T-DaP:Given prenatally Flu: N/A Transfusion:No  Physical exam  Vitals:   12/17/21 1525 12/17/21 2115 12/18/21 0325 12/18/21 0500  BP: 107/71 106/63  117/74  Pulse: 89 75  83  Resp: 17 18  16   Temp: 98.1 F (36.7 C) 97.7 F (36.5 C) 99.5 F (37.5 C) 99 F (37.2 C)  TempSrc: Oral Oral Axillary Oral  SpO2: 99% 98%  97%  Weight:      Height:       General: alert Lochia: appropriate Uterine Fundus: firm Incision: N/A DVT Evaluation: No evidence of DVT seen on physical exam. Labs: Lab Results  Component Value Date   WBC 11.1 (H) 12/17/2021   HGB 10.1 (L) 12/17/2021   HCT 29.3 (L)  12/17/2021   MCV 94.8 12/17/2021   PLT 187 12/17/2021      Latest Ref Rng & Units 05/19/2021    9:43 PM  CMP  Glucose 70 - 99 mg/dL 88   BUN 6 - 20 mg/dL 11   Creatinine 0.44 - 1.00 mg/dL 0.66   Sodium 135 - 145 mmol/L 135   Potassium 3.5 - 5.1 mmol/L 3.6   Chloride 98 - 111 mmol/L 104   CO2 22 - 32 mmol/L 24   Calcium 8.9 - 10.3 mg/dL 9.4    Edinburgh Score:    12/17/2021    7:25 AM  Edinburgh Postnatal Depression Scale Screening Tool  I have been able to laugh and see the funny side of things. 0  I have looked forward with enjoyment to things. 0  I have blamed myself unnecessarily when things went wrong. 0  I have been anxious or worried for no good reason. 0  I have felt scared or panicky for no good reason. 0  Things have been getting on top of me. 0  I have been so unhappy that I have had difficulty sleeping. 0  I have felt sad or miserable. 0  I have been so unhappy that I have been crying. 0  The thought of harming myself has occurred to me. 0  Edinburgh Postnatal Depression Scale  Total 0      After visit meds:  Allergies as of 12/18/2021   No Known Allergies      Medication List     TAKE these medications    multivitamin tablet Take 1 tablet by mouth daily.         Discharge home in stable condition Infant Feeding: Bottle Infant Disposition:home with mother Discharge instruction: per After Visit Summary and Postpartum booklet. Activity: Advance as tolerated. Pelvic rest for 6 weeks.  Diet: routine diet Anticipated Birth Control: Unsure Postpartum Appointment:6 weeks Additional Postpartum F/U:  None Future Appointments:No future appointments. Follow up Visit:      12/18/2021 Tyson Dense, MD

## 2021-12-19 ENCOUNTER — Ambulatory Visit: Payer: Self-pay

## 2021-12-19 NOTE — Lactation Note (Signed)
This note was copied from a baby's chart. Lactation Consultation Note  Patient Name: Anne Whitaker HKFEX'M Date: 12/19/2021 Reason for consult: Mother's request;Follow-up assessment;1st time breastfeeding;Term (-6% weight loss. Per Birth Parent she wants to BF and supplement infant with formula. Not soley formula feed infant.) Age:33 hours Per Birth Parent , she does want to BF infant, she tried around 2100 pm to latch infant at breast herself but infant would not latch. Birth Parent has mostly been formula feeding infant today, infant is currently consuming 30 to 40 mls of formula per feeding. LC could not assist with latching infant at this time due recently being formula feed. LC discussed with Birth Parent to call RN/LC  to assist with latching infant at the next feeding.  Continue to feed infant by cues, on demand, 8 to 12+ times within 24 hours. [name] set Birth Parent up with DEBP, Birth Parent was pumping as LC left the room, Birth Parent knows to pump every 3 hours for 15 minutes on initial setting. Birth Parent knows if infant doesn't latch to supplement infant with any EBM first and then formula. Birth Parent doesn't have pump at home, [facility] explained and demonstrated there is hand pump in DEBP kit.  LC left [employer] Pump form in [employer] Parent room for Medical Provider's Signature and Birth Parent label placed on form and discussed this with Nurse, mental health.  Maternal Data Does the patient have breastfeeding experience prior to this delivery?: No  Feeding Mother's Current Feeding Choice: Breast Milk and Formula  LATCH Score                    Lactation Tools Discussed/Used Tools: Pump;Flanges Flange Size: 21 Breast pump type: Double-Electric Breast Pump Pump Education: Setup, frequency, and cleaning;Milk Storage Reason for Pumping: To help stimulate and establish Birth Parent milk supply Pumping frequency: Birth Parent will continue to use DEBP every 3 hours for 15  minutes on inital setting.  Interventions Interventions: Education;DEBP  Discharge    Consult Status Consult Status: Follow-up Date: 12/19/21 Follow-up type: In-patient    Vicente Serene 12/19/2021, 12:05 AM

## 2021-12-27 ENCOUNTER — Telehealth (HOSPITAL_COMMUNITY): Payer: Self-pay | Admitting: *Deleted

## 2021-12-27 NOTE — Telephone Encounter (Signed)
Voicemail not setup. Unable to leave message.  Odis Hollingshead, RN 12-27-2021 at 11;21am

## 2023-03-26 IMAGING — US US MFM OB TRANSVAGINAL
1 series · 16 of 28 positions shown · non-contrast
Comparison: none

[Series 1: us mfm ob transvaginal · 31 acquisitions, 16 frames shown]
[im 1/31]
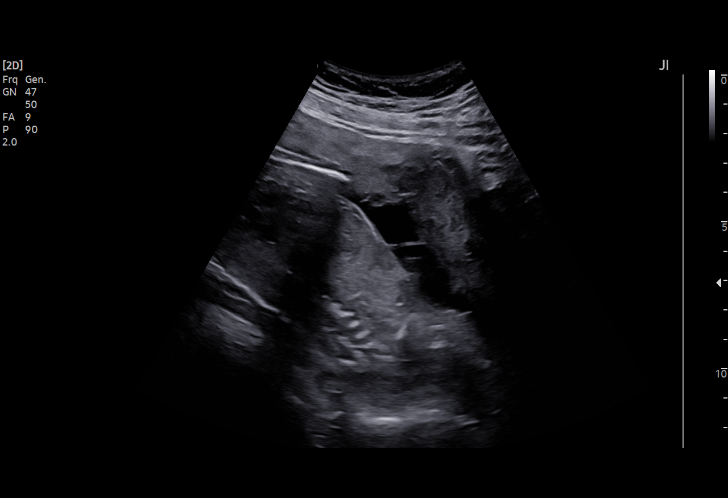
[im 3/31]
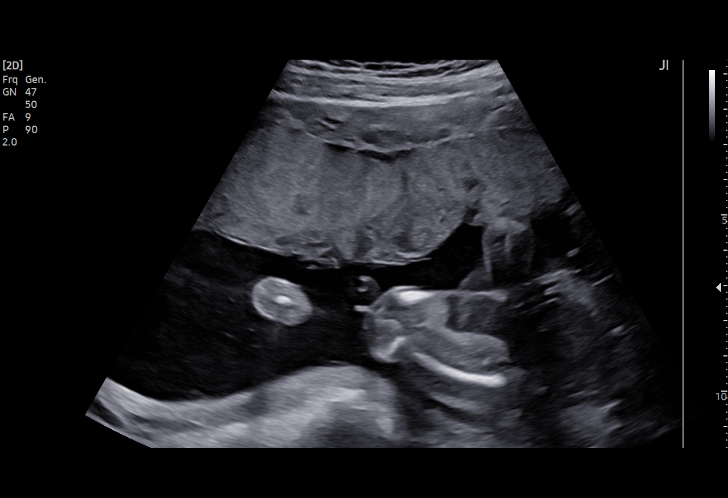
[im 5/31]
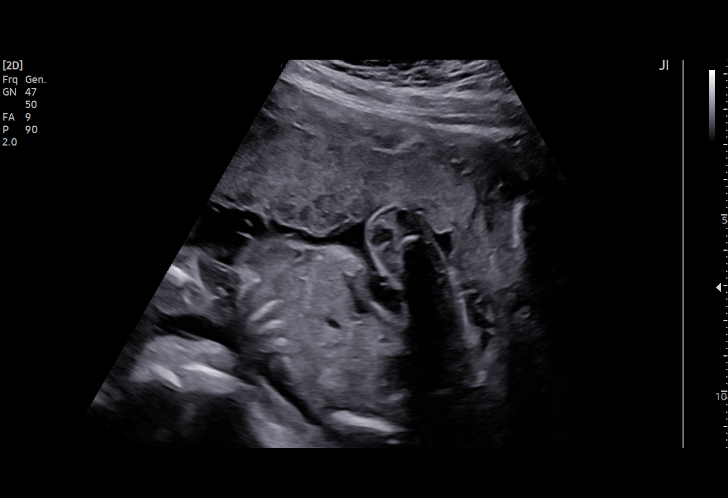
[im 7/31]
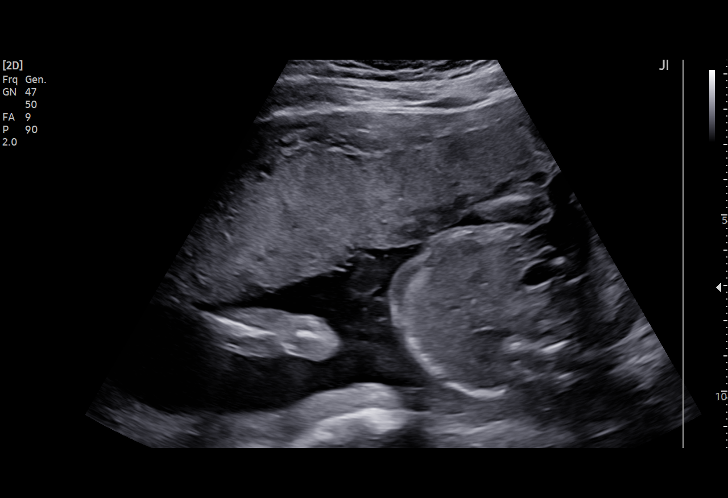
[im 8/31]
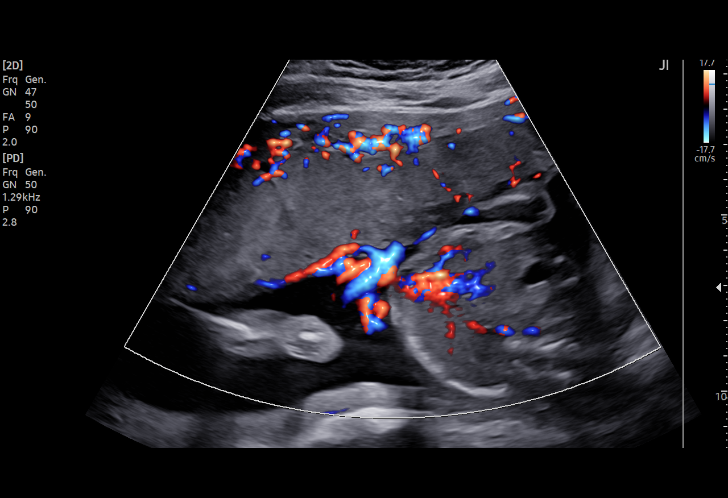
[im 11/31]
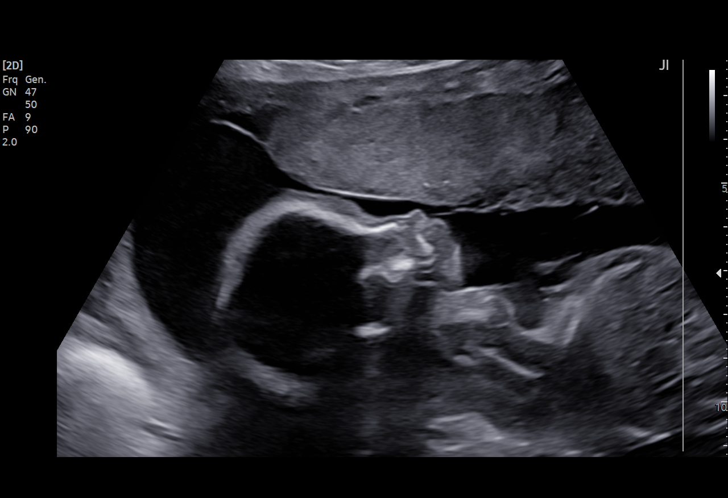
[im 13/31]
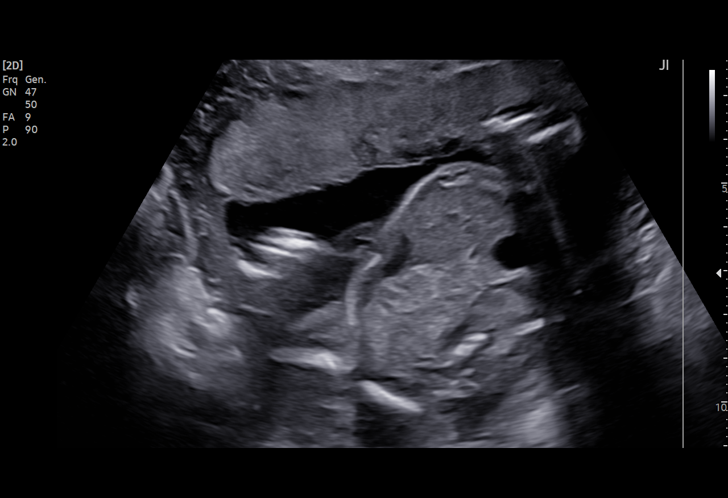
[im 15/31]
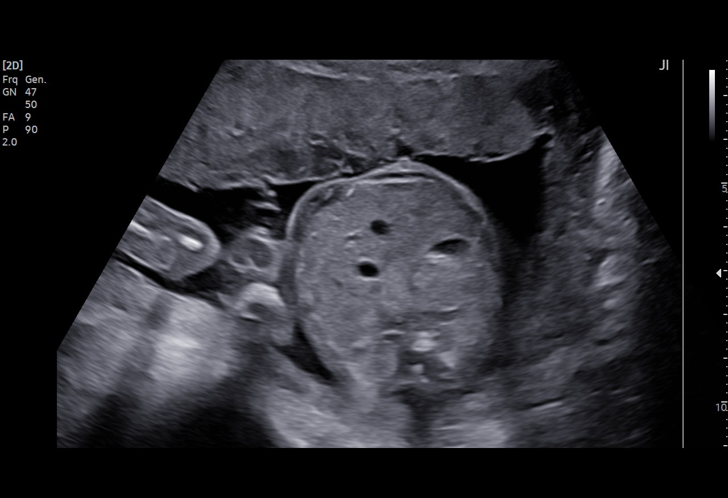
[im 16/31]
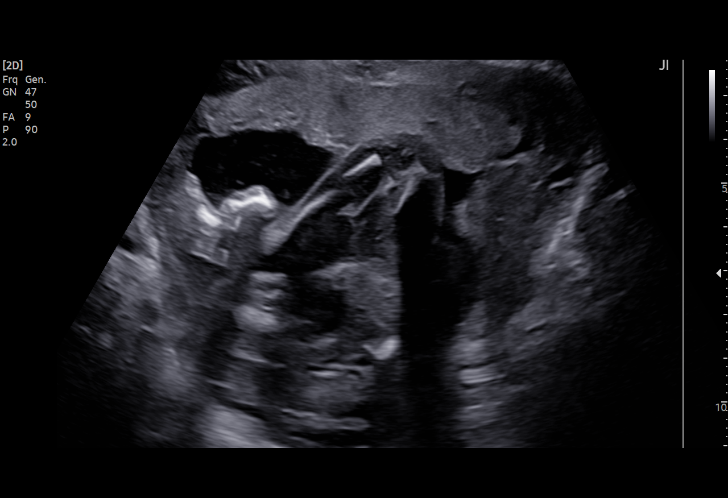
[im 18/31]
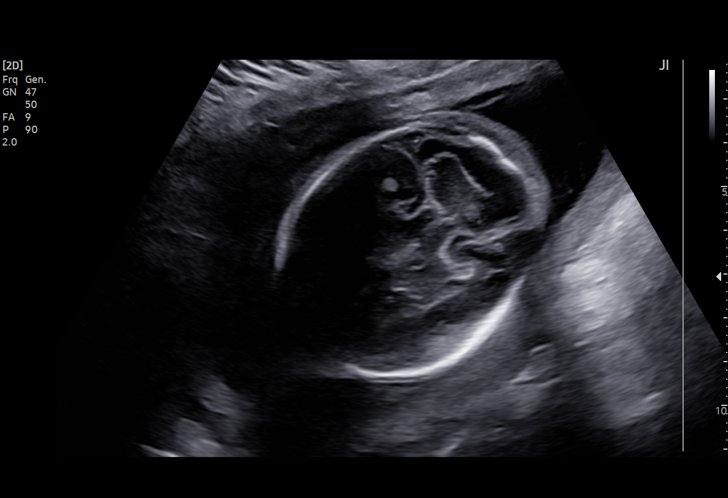
[im 21/31]
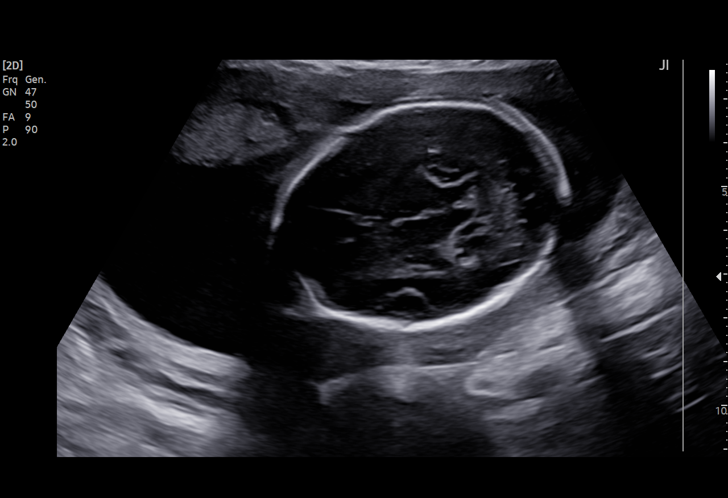
[im 23/31]
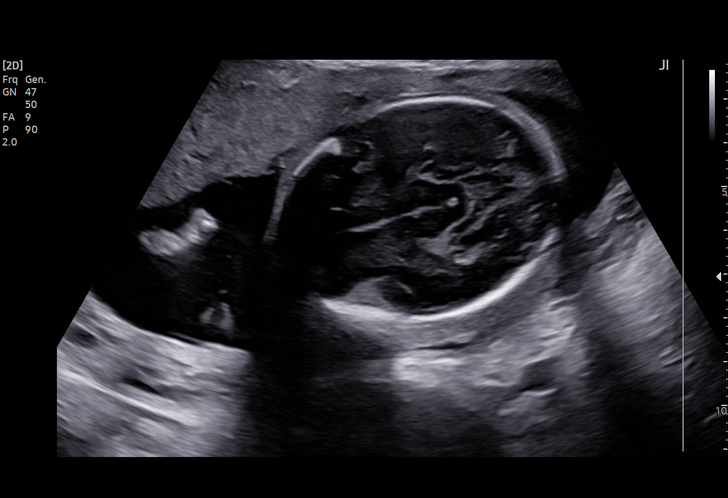
[im 24/31]
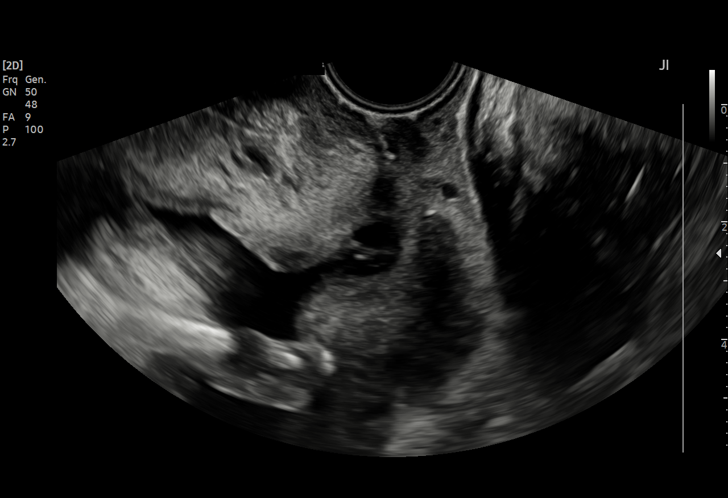
[im 26/31]
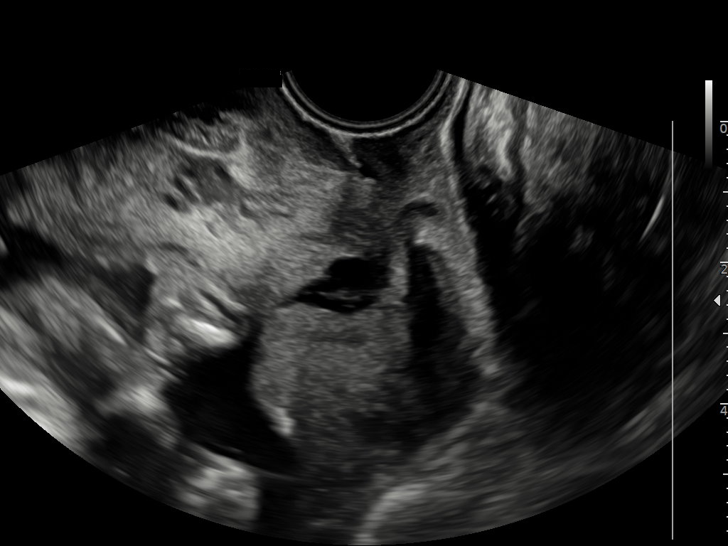
[im 28/31]
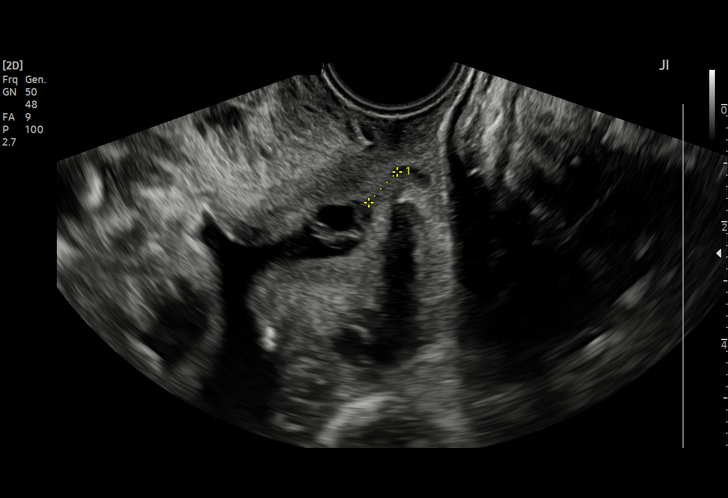
[im 31/31]
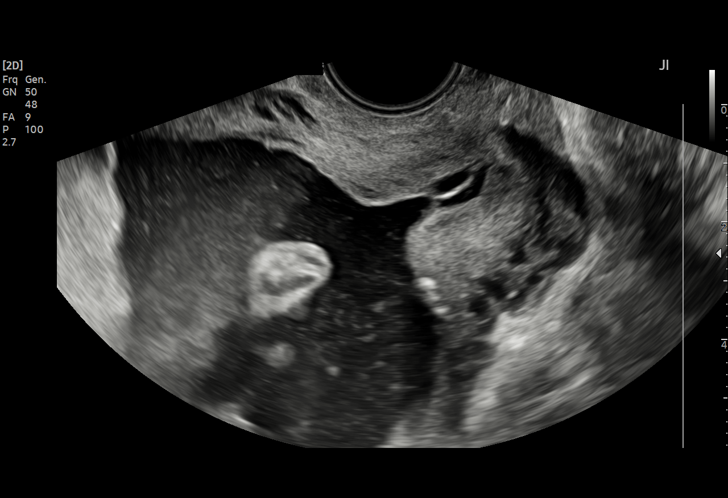

[16 of 28 positions shown; findings below may reference images not displayed]

[REDACTED]

Indications

 22 weeks gestation of pregnancy
 Cervical insufficiency, 2nd
 Poor obstetric history: Previous preterm
 delivery, antepartum
 Low Risk NIPS

## 2023-11-09 ENCOUNTER — Other Ambulatory Visit: Payer: Self-pay

## 2023-11-09 ENCOUNTER — Encounter (HOSPITAL_COMMUNITY): Payer: Self-pay

## 2023-11-09 ENCOUNTER — Observation Stay (HOSPITAL_COMMUNITY)
Admission: EM | Admit: 2023-11-09 | Discharge: 2023-11-13 | Disposition: A | Discharge status: Home / Self Care | Attending: Gastroenterology | Admitting: Gastroenterology

## 2023-11-09 DIAGNOSIS — K760 Fatty (change of) liver, not elsewhere classified: Secondary | ICD-10-CM | POA: Insufficient documentation

## 2023-11-09 DIAGNOSIS — D649 Anemia, unspecified: Secondary | ICD-10-CM | POA: Insufficient documentation

## 2023-11-09 DIAGNOSIS — R8281 Pyuria: Secondary | ICD-10-CM | POA: Diagnosis not present

## 2023-11-09 DIAGNOSIS — K8010 Calculus of gallbladder with chronic cholecystitis without obstruction: Principal | ICD-10-CM | POA: Insufficient documentation

## 2023-11-09 DIAGNOSIS — R1011 Right upper quadrant pain: Secondary | ICD-10-CM | POA: Insufficient documentation

## 2023-11-09 DIAGNOSIS — Z905 Acquired absence of kidney: Secondary | ICD-10-CM | POA: Diagnosis not present

## 2023-11-09 DIAGNOSIS — R7401 Elevation of levels of liver transaminase levels: Secondary | ICD-10-CM | POA: Insufficient documentation

## 2023-11-09 DIAGNOSIS — R7989 Other specified abnormal findings of blood chemistry: Secondary | ICD-10-CM | POA: Insufficient documentation

## 2023-11-09 DIAGNOSIS — R109 Unspecified abdominal pain: Secondary | ICD-10-CM | POA: Diagnosis present

## 2023-11-09 DIAGNOSIS — K8050 Calculus of bile duct without cholangitis or cholecystitis without obstruction: Secondary | ICD-10-CM | POA: Insufficient documentation

## 2023-11-09 DIAGNOSIS — I899 Noninfective disorder of lymphatic vessels and lymph nodes, unspecified: Secondary | ICD-10-CM | POA: Diagnosis not present

## 2023-11-09 DIAGNOSIS — K810 Acute cholecystitis: Secondary | ICD-10-CM

## 2023-11-09 DIAGNOSIS — I774 Celiac artery compression syndrome: Secondary | ICD-10-CM | POA: Insufficient documentation

## 2023-11-09 DIAGNOSIS — Z9049 Acquired absence of other specified parts of digestive tract: Secondary | ICD-10-CM

## 2023-11-09 DIAGNOSIS — K869 Disease of pancreas, unspecified: Secondary | ICD-10-CM | POA: Diagnosis not present

## 2023-11-09 DIAGNOSIS — R748 Abnormal levels of other serum enzymes: Principal | ICD-10-CM

## 2023-11-09 DIAGNOSIS — K3189 Other diseases of stomach and duodenum: Secondary | ICD-10-CM | POA: Insufficient documentation

## 2023-11-09 DIAGNOSIS — K8020 Calculus of gallbladder without cholecystitis without obstruction: Secondary | ICD-10-CM | POA: Insufficient documentation

## 2023-11-09 DIAGNOSIS — R1013 Epigastric pain: Secondary | ICD-10-CM | POA: Diagnosis present

## 2023-11-09 LAB — CBC
HCT: 37.6 % (ref 36.0–46.0)
Hemoglobin: 12.4 g/dL (ref 12.0–15.0)
MCH: 29.6 pg (ref 26.0–34.0)
MCHC: 33 g/dL (ref 30.0–36.0)
MCV: 89.7 fL (ref 80.0–100.0)
Platelets: 266 K/uL (ref 150–400)
RBC: 4.19 MIL/uL (ref 3.87–5.11)
RDW: 12.8 % (ref 11.5–15.5)
WBC: 8.3 K/uL (ref 4.0–10.5)
nRBC: 0 % (ref 0.0–0.2)

## 2023-11-09 LAB — COMPREHENSIVE METABOLIC PANEL WITH GFR
ALT: 185 U/L — ABNORMAL HIGH (ref 0–44)
AST: 261 U/L — ABNORMAL HIGH (ref 15–41)
Albumin: 4.6 g/dL (ref 3.5–5.0)
Alkaline Phosphatase: 117 U/L (ref 38–126)
Anion gap: 10 (ref 5–15)
BUN: 7 mg/dL (ref 6–20)
CO2: 27 mmol/L (ref 22–32)
Calcium: 10 mg/dL (ref 8.9–10.3)
Chloride: 101 mmol/L (ref 98–111)
Creatinine, Ser: 0.52 mg/dL (ref 0.44–1.00)
GFR, Estimated: >60 mL/min (ref >60)
Glucose, Bld: 103 mg/dL — ABNORMAL HIGH (ref 70–99)
Potassium: 3.9 mmol/L (ref 3.5–5.1)
Sodium: 139 mmol/L (ref 135–145)
Total Bilirubin: 0.9 mg/dL (ref 0.0–1.2)
Total Protein: 7.1 g/dL (ref 6.5–8.1)

## 2023-11-09 LAB — HCG, SERUM, QUALITATIVE: Preg, Serum: NEGATIVE

## 2023-11-09 LAB — LIPASE, BLOOD: Lipase: 27 U/L (ref 11–51)

## 2023-11-09 NOTE — ED Triage Notes (Signed)
 Pt. Reports abdominal that radiates to the chest and lower middle region of her back. Denies vomiting but states that she has had nausea and SOB.

## 2023-11-10 ENCOUNTER — Emergency Department (HOSPITAL_COMMUNITY)

## 2023-11-10 ENCOUNTER — Encounter (HOSPITAL_COMMUNITY): Payer: Self-pay | Admitting: Radiology

## 2023-11-10 DIAGNOSIS — R7989 Other specified abnormal findings of blood chemistry: Secondary | ICD-10-CM | POA: Diagnosis not present

## 2023-11-10 DIAGNOSIS — R1011 Right upper quadrant pain: Secondary | ICD-10-CM

## 2023-11-10 DIAGNOSIS — R7401 Elevation of levels of liver transaminase levels: Secondary | ICD-10-CM | POA: Insufficient documentation

## 2023-11-10 DIAGNOSIS — D649 Anemia, unspecified: Secondary | ICD-10-CM

## 2023-11-10 DIAGNOSIS — R109 Unspecified abdominal pain: Secondary | ICD-10-CM | POA: Diagnosis present

## 2023-11-10 DIAGNOSIS — K8020 Calculus of gallbladder without cholecystitis without obstruction: Secondary | ICD-10-CM | POA: Diagnosis not present

## 2023-11-10 DIAGNOSIS — R1013 Epigastric pain: Secondary | ICD-10-CM | POA: Diagnosis not present

## 2023-11-10 DIAGNOSIS — K8050 Calculus of bile duct without cholangitis or cholecystitis without obstruction: Secondary | ICD-10-CM | POA: Insufficient documentation

## 2023-11-10 LAB — URINALYSIS, ROUTINE W REFLEX MICROSCOPIC
Bilirubin Urine: NEGATIVE
Glucose, UA: NEGATIVE mg/dL
Ketones, ur: NEGATIVE mg/dL
Nitrite: NEGATIVE
Protein, ur: 30 mg/dL — AB
RBC / HPF: >50 RBC/hpf (ref 0–5)
Specific Gravity, Urine: 1.014 (ref 1.005–1.030)
WBC, UA: >50 WBC/hpf (ref 0–5)
pH: 7 (ref 5.0–8.0)

## 2023-11-10 LAB — TROPONIN T, HIGH SENSITIVITY: Troponin T High Sensitivity: <15 ng/L (ref 0–19)

## 2023-11-10 LAB — BASIC METABOLIC PANEL WITH GFR
Anion gap: 11 (ref 5–15)
BUN: 6 mg/dL (ref 6–20)
CO2: 22 mmol/L (ref 22–32)
Calcium: 7.5 mg/dL — ABNORMAL LOW (ref 8.9–10.3)
Chloride: 111 mmol/L (ref 98–111)
Creatinine, Ser: 0.48 mg/dL (ref 0.44–1.00)
GFR, Estimated: >60 mL/min (ref >60)
Glucose, Bld: 94 mg/dL (ref 70–99)
Potassium: 3.3 mmol/L — ABNORMAL LOW (ref 3.5–5.1)
Sodium: 143 mmol/L (ref 135–145)

## 2023-11-10 LAB — HEPATIC FUNCTION PANEL
ALT: 531 U/L — ABNORMAL HIGH (ref 0–44)
ALT: 587 U/L — ABNORMAL HIGH (ref 0–44)
AST: 601 U/L — ABNORMAL HIGH (ref 15–41)
AST: 629 U/L — ABNORMAL HIGH (ref 15–41)
Albumin: 3.8 g/dL (ref 3.5–5.0)
Albumin: 3.5 g/dL (ref 3.5–5.0)
Alkaline Phosphatase: 116 U/L (ref 38–126)
Alkaline Phosphatase: 110 U/L (ref 38–126)
Bilirubin, Direct: 0.9 mg/dL — ABNORMAL HIGH (ref 0.0–0.2)
Bilirubin, Direct: 0.9 mg/dL — ABNORMAL HIGH (ref 0.0–0.2)
Indirect Bilirubin: 0.4 mg/dL (ref 0.3–0.9)
Indirect Bilirubin: 0.4 mg/dL (ref 0.3–0.9)
Total Bilirubin: 1.2 mg/dL (ref 0.0–1.2)
Total Bilirubin: 1.3 mg/dL — ABNORMAL HIGH (ref 0.0–1.2)
Total Protein: 5.8 g/dL — ABNORMAL LOW (ref 6.5–8.1)
Total Protein: 5.3 g/dL — ABNORMAL LOW (ref 6.5–8.1)

## 2023-11-10 LAB — CBC WITH DIFFERENTIAL/PLATELET
Abs Immature Granulocytes: 0.04 K/uL (ref 0.00–0.07)
Basophils Absolute: 0 K/uL (ref 0.0–0.1)
Basophils Relative: 0 %
Eosinophils Absolute: 0 K/uL (ref 0.0–0.5)
Eosinophils Relative: 1 %
HCT: 33.4 % — ABNORMAL LOW (ref 36.0–46.0)
Hemoglobin: 11.1 g/dL — ABNORMAL LOW (ref 12.0–15.0)
Immature Granulocytes: 1 %
Lymphocytes Relative: 37 %
Lymphs Abs: 2.3 K/uL (ref 0.7–4.0)
MCH: 30.2 pg (ref 26.0–34.0)
MCHC: 33.2 g/dL (ref 30.0–36.0)
MCV: 90.8 fL (ref 80.0–100.0)
Monocytes Absolute: 0.5 K/uL (ref 0.1–1.0)
Monocytes Relative: 8 %
Neutro Abs: 3.3 K/uL (ref 1.7–7.7)
Neutrophils Relative %: 53 %
Platelets: 220 K/uL (ref 150–400)
RBC: 3.68 MIL/uL — ABNORMAL LOW (ref 3.87–5.11)
RDW: 13 % (ref 11.5–15.5)
WBC: 6.3 K/uL (ref 4.0–10.5)
nRBC: 0 % (ref 0.0–0.2)

## 2023-11-10 LAB — PROTIME-INR
INR: 1 (ref 0.8–1.2)
Prothrombin Time: 14.1 s (ref 11.4–15.2)

## 2023-11-10 LAB — HEPATITIS PANEL, ACUTE
HCV Ab: NONREACTIVE
Hep A IgM: NONREACTIVE
Hep B C IgM: NONREACTIVE
Hepatitis B Surface Ag: NONREACTIVE

## 2023-11-10 LAB — HIV ANTIBODY (ROUTINE TESTING W REFLEX): HIV Screen 4th Generation wRfx: NONREACTIVE

## 2023-11-10 LAB — LACTIC ACID, PLASMA: Lactic Acid, Venous: 1.7 mmol/L (ref 0.5–1.9)

## 2023-11-10 LAB — ACETAMINOPHEN LEVEL: Acetaminophen (Tylenol), Serum: <10 ug/mL — ABNORMAL LOW (ref 10–30)

## 2023-11-10 MED ORDER — MORPHINE SULFATE (PF) 4 MG/ML IV SOLN
4.0000 mg | Freq: Once | INTRAVENOUS | Status: AC
  Administered 2023-11-10: 4 mg via INTRAVENOUS
  Filled 2023-11-10: qty 1

## 2023-11-10 MED ORDER — MIDAZOLAM HCL 2 MG/2ML IJ SOLN
INTRAMUSCULAR | Status: AC
  Filled 2023-11-10: qty 2

## 2023-11-10 MED ORDER — ONDANSETRON HCL 4 MG/2ML IJ SOLN
4.0000 mg | Freq: Once | INTRAMUSCULAR | Status: AC
Start: 2023-11-10 — End: 2023-11-10
  Administered 2023-11-10: 4 mg via INTRAVENOUS
  Filled 2023-11-10: qty 2

## 2023-11-10 MED ORDER — HYDROMORPHONE HCL 1 MG/ML IJ SOLN: 0.5000 mg | INTRAMUSCULAR | Status: DC | PRN

## 2023-11-10 MED ORDER — ACETAMINOPHEN 500 MG PO TABS
1000.0000 mg | ORAL_TABLET | Freq: Once | ORAL | Status: DC
Start: 2023-11-10 — End: 2023-11-10

## 2023-11-10 MED ORDER — ONDANSETRON HCL 4 MG/2ML IJ SOLN
4.0000 mg | Freq: Once | INTRAMUSCULAR | Status: AC
  Administered 2023-11-10: 4 mg via INTRAVENOUS
  Filled 2023-11-10: qty 2

## 2023-11-10 MED ORDER — ONDANSETRON HCL 4 MG/2ML IJ SOLN
4.0000 mg | INTRAMUSCULAR | Status: DC | PRN
  Administered 2023-11-12 – 2023-11-13 (×2): 4 mg via INTRAVENOUS
  Filled 2023-11-10 (×2): qty 2

## 2023-11-10 MED ORDER — PROPOFOL 10 MG/ML IV BOLUS
INTRAVENOUS | Status: AC
  Filled 2023-11-10: qty 20

## 2023-11-10 MED ORDER — LACTATED RINGERS IV SOLN: INTRAVENOUS | Status: DC

## 2023-11-10 MED ORDER — BUPIVACAINE-EPINEPHRINE (PF) 0.25% -1:200000 IJ SOLN
INTRAMUSCULAR | Status: AC
  Filled 2023-11-10: qty 30

## 2023-11-10 MED ORDER — ALUM & MAG HYDROXIDE-SIMETH 200-200-20 MG/5ML PO SUSP: 30.0000 mL | Freq: Four times a day (QID) | ORAL | Status: DC | PRN

## 2023-11-10 MED ORDER — IOHEXOL 350 MG/ML SOLN
100.0000 mL | Freq: Once | INTRAVENOUS | Status: AC | PRN
  Administered 2023-11-10: 100 mL via INTRAVENOUS

## 2023-11-10 MED ORDER — SODIUM CHLORIDE 0.9 % IV BOLUS
500.0000 mL | Freq: Once | INTRAVENOUS | Status: AC
  Administered 2023-11-10: 500 mL via INTRAVENOUS

## 2023-11-10 MED ORDER — SCOPOLAMINE 1 MG/3DAYS TD PT72
1.0000 | MEDICATED_PATCH | TRANSDERMAL | Status: DC
  Administered 2023-11-10: 1 mg via TRANSDERMAL
  Filled 2023-11-10 (×2): qty 1

## 2023-11-10 MED ORDER — CHLORHEXIDINE GLUCONATE 0.12 % MT SOLN
15.0000 mL | Freq: Once | OROMUCOSAL | Status: AC
  Administered 2023-11-10: 15 mL via OROMUCOSAL

## 2023-11-10 MED ORDER — OXYCODONE HCL 5 MG PO TABS
5.0000 mg | ORAL_TABLET | ORAL | Status: DC | PRN
  Administered 2023-11-11: 5 mg via ORAL
  Administered 2023-11-11: 10 mg via ORAL
  Administered 2023-11-12: 5 mg via ORAL
  Administered 2023-11-12 (×2): 10 mg via ORAL
  Administered 2023-11-13 (×2): 5 mg via ORAL
  Filled 2023-11-10: qty 2
  Filled 2023-11-10: qty 1
  Filled 2023-11-10 (×2): qty 2
  Filled 2023-11-10: qty 1
  Filled 2023-11-10: qty 2

## 2023-11-10 MED ORDER — LACTATED RINGERS IV SOLN
INTRAVENOUS | Status: AC
Start: 2023-11-10 — End: 2023-11-11

## 2023-11-10 MED ORDER — ACETAMINOPHEN 500 MG PO TABS
1000.0000 mg | ORAL_TABLET | ORAL | Status: AC
  Administered 2023-11-10: 1000 mg via ORAL
  Filled 2023-11-10: qty 2

## 2023-11-10 MED ORDER — PANTOPRAZOLE SODIUM 40 MG IV SOLR
40.0000 mg | Freq: Two times a day (BID) | INTRAVENOUS | Status: DC
  Administered 2023-11-10 – 2023-11-12 (×4): 40 mg via INTRAVENOUS
  Filled 2023-11-10 (×4): qty 10

## 2023-11-10 MED ORDER — SODIUM CHLORIDE 0.9 % IV SOLN
INTRAVENOUS | Status: AC
  Filled 2023-11-10: qty 20

## 2023-11-10 MED ORDER — PROCHLORPERAZINE EDISYLATE 10 MG/2ML IJ SOLN: 5.0000 mg | INTRAMUSCULAR | Status: DC | PRN

## 2023-11-10 MED ORDER — SODIUM CHLORIDE 0.9 % IV SOLN
2.0000 g | INTRAVENOUS | Status: DC
Start: 2023-11-10 — End: 2023-11-10
  Filled 2023-11-10: qty 20

## 2023-11-10 MED ORDER — FENTANYL CITRATE (PF) 100 MCG/2ML IJ SOLN
INTRAMUSCULAR | Status: AC
  Filled 2023-11-10: qty 2

## 2023-11-10 NOTE — ED Provider Notes (Signed)
 [place] EMERGENCY DEPARTMENT AT [facility] Provider Note   CSN: [id number] Arrival date & time: 11/09/23  2144     Patient presents with: Abdominal Pain   Anne Whitaker is a 33 y.o. female with PMHx GERD who presents to ED concerned for abdominal pain starting around 4PM today. Abdominal pain starts in epigastric/RUQ area and radiates around the right side towards her back. Patient also stating that the pain radiates upwards somewhat towards her chest. Patient tool omeprazole  earlier today which only relieved the symptoms for around . Patient also endorsing diarrhea 3 days ago which has since resolved. Patient also endorses nausea but denies vomiting. Also endorses difficulties breathing/deep inspirations d/t the pain. Denies dysuria, hematuria. Denies recent ETOH or other drug use.  Of note, patient currently on menstrual cycle x3 days and stating that it has been heavier than prior cycles.      Abdominal Pain      Prior to Admission medications   Medication Sig Start Date End Date Taking? Authorizing Provider  Multiple Vitamin (MULTIVITAMIN) tablet Take 1 tablet by mouth daily.    [provider]    Allergies: Patient has no known allergies.    Review of Systems  Gastrointestinal:  Positive for abdominal pain.    Updated Vital Signs BP 127/77   Pulse 62   Temp 98.5 F (36.9 C) (Oral)   Resp 19   SpO2 94%   Physical Exam Vitals and nursing note reviewed.  Constitutional:      General: She is not in acute distress.    Appearance: She is not ill-appearing or toxic-appearing.  HENT:     Head: Normocephalic and atraumatic.     Mouth/Throat:     Mouth: Mucous membranes are moist.     Pharynx: No posterior oropharyngeal erythema.  Eyes:     General: No scleral icterus.       Right eye: No discharge.        Left eye: No discharge.     Conjunctiva/sclera: Conjunctivae normal.  Cardiovascular:     Rate and Rhythm: Normal rate and  regular rhythm.     Pulses: Normal pulses.     Heart sounds: Normal heart sounds. No murmur heard. Pulmonary:     Effort: Pulmonary effort is normal. No respiratory distress.     Breath sounds: Normal breath sounds. No wheezing, rhonchi or rales.  Abdominal:     General: Abdomen is flat. Bowel sounds are normal. There is no distension.     Palpations: Abdomen is soft. There is no mass.     Tenderness: There is abdominal tenderness in the right upper quadrant.  Musculoskeletal:     Right lower leg: No edema.     Left lower leg: No edema.  Skin:    General: Skin is warm and dry.     Findings: No rash.  Neurological:     General: No focal deficit present.     Mental Status: She is alert and oriented to person, place, and time. Mental status is at baseline.  Psychiatric:        Mood and Affect: Mood normal.        Behavior: Behavior normal.     (all labs ordered are listed, but only abnormal results are displayed) Labs Reviewed  COMPREHENSIVE METABOLIC PANEL WITH GFR - Abnormal; Notable for the following components:      Result Value   Glucose, Bld 103 (*)    AST 261 (*)  ALT 185 (*)    All other components within normal limits  URINALYSIS, ROUTINE W REFLEX MICROSCOPIC - Abnormal; Notable for the following components:   Color, Urine AMBER (*)    APPearance CLOUDY (*)    Hgb urine dipstick LARGE (*)    Protein, ur 30 (*)    Leukocytes,Ua TRACE (*)    Bacteria, UA FEW (*)    All other components within normal limits  HEPATIC FUNCTION PANEL - Abnormal; Notable for the following components:   Total Protein 5.8 (*)    AST 601 (*)    ALT 531 (*)    Bilirubin, Direct 0.9 (*)    All other components within normal limits  LIPASE, BLOOD  CBC  HCG, SERUM, QUALITATIVE  HEPATITIS PANEL, ACUTE  ACETAMINOPHEN  LEVEL    EKG: None  Radiology: CT Angio Chest/Abd/Pel for Dissection W and/or W/WO Result Date: 11/10/2023 CLINICAL DATA:  Chest and abdominal pain EXAM: CT ANGIOGRAPHY  CHEST, ABDOMEN AND PELVIS TECHNIQUE: Non-contrast CT of the chest was initially obtained. Multidetector CT imaging through the chest, abdomen and pelvis was performed using the standard protocol during bolus administration of intravenous contrast. Multiplanar reconstructed images and MIPs were obtained and reviewed to evaluate the vascular anatomy. RADIATION DOSE REDUCTION: This exam was performed according to the departmental dose-optimization program which includes automated exposure control, adjustment of the mA and/or kV according to patient size and/or use of iterative reconstruction technique. CONTRAST:  OMNIPAQUE  IOHEXOL  350 MG/ML SOLN COMPARISON:  None Available. FINDINGS: CTA CHEST FINDINGS Cardiovascular: Initial precontrast images show no acute abnormality. Post-contrast images demonstrate the thoracic aorta to have no aneurysmal dilatation or dissection. The heart is at the upper limits of normal in size. Pulmonary artery shows a normal branching pattern without intraluminal filling defect. Mediastinum/Nodes: Thoracic inlet is within normal limits. No hilar or mediastinal adenopathy is noted. The esophagus as visualized is within normal limits. Lungs/Pleura: Lungs are well aerated bilaterally. No focal infiltrate or sizable effusion is seen. No parenchymal nodules are noted. Musculoskeletal: No chest wall abnormality. No acute or significant osseous findings. Review of the MIP images confirms the above findings. CTA ABDOMEN AND PELVIS FINDINGS VASCULAR Aorta: Abdominal aorta demonstrates no atherosclerotic calcification. No aneurysmal dilatation or dissection is seen. Celiac: Median arcuate ligament compression is noted. Mild poststenotic dilatation is seen. SMA: Patent without evidence of aneurysm, dissection, vasculitis or significant stenosis. Renals: Both renal arteries are patent without evidence of aneurysm, dissection, vasculitis, fibromuscular dysplasia or significant stenosis. 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 IMA: Patent without evidence of aneurysm, dissection, vasculitis or significant stenosis. Inflow: Iliacs are well visualized and within normal limits. Veins: No specific venous abnormality is noted. Review of the MIP images confirms the above findings. NON-VASCULAR Hepatobiliary: No focal liver abnormality is seen. No gallstones, gallbladder wall thickening, or biliary dilatation. Pancreas: Unremarkable. No pancreatic ductal dilatation or surrounding inflammatory changes. Spleen: Normal in size without focal abnormality. Adrenals/Urinary Tract: Adrenal glands are within normal limits. Kidneys are well visualized bilaterally. No renal calculi or obstructive changes are noted. The bladder is partially distended. Stomach/Bowel: No obstructive or inflammatory changes of the colon are noted. The appendix is within normal limits. Small bowel and stomach are unremarkable. Lymphatic: No lymphadenopathy is noted. Reproductive: Uterus and bilateral adnexa are unremarkable. Other: No abdominal wall hernia or abnormality. No abdominopelvic ascites. Musculoskeletal: No acute or significant osseous findings. Review of the MIP images confirms the above findings. IMPRESSION: CTA of the chest: No evidence of pulmonary  emboli. No acute aortic abnormality is seen. No acute abnormality seen. CTA of the abdomen and pelvis: No acute aortic abnormality is noted. 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 No other focal abnormality is noted. Electronically Signed   By: Oneil Devonshire M.D.   On: 11/10/2023 03:12   US  Abdomen Limited RUQ (LIVER/GB) Result Date: 11/10/2023 CLINICAL DATA:  151471 RUQ pain 151471 EXAM: ULTRASOUND ABDOMEN LIMITED RIGHT UPPER QUADRANT COMPARISON:  None Available. FINDINGS: Gallbladder: Subcentimeter calcified gallstone within the gallbladder lumen. No gallbladder wall thickening or pericholecystic fluid visualized. No sonographic Murphy sign noted by  sonographer. Common bile duct: Diameter: 2 mm Liver: No focal lesion identified. Increased parenchymal echogenicity. Portal vein is patent on color Doppler imaging with normal direction of blood flow towards the liver. Other: None. IMPRESSION: 1. Cholelithiasis with no acute cholecystitis. 2. Hepatic steatosis. Please note limited evaluation for focal hepatic masses in a patient with hepatic steatosis due to decreased penetration of the acoustic ultrasound waves. Electronically Signed   By: Morgane  Naveau M.D.   On: 11/10/2023 01:48     Procedures   Medications Ordered in the ED  ondansetron  (ZOFRAN ) injection 4 mg (4 mg Intravenous Given 11/10/23 0056)  morphine  (PF) 4 MG/ML injection 4 mg (4 mg Intravenous Given 11/10/23 0119)  iohexol  (OMNIPAQUE ) 350 MG/ML injection 100 mL (100 mLs Intravenous Contrast Given 11/10/23 0226)  sodium chloride  0.9 % bolus 500 mL (0 mLs Intravenous Stopped 11/10/23 0429)  morphine  (PF) 4 MG/ML injection 4 mg (4 mg Intravenous Given 11/10/23 0448)  ondansetron  (ZOFRAN ) injection 4 mg (4 mg Intravenous Given 11/10/23 0448)                                    Medical Decision Making Amount and/or Complexity of Data Reviewed Labs: ordered. Radiology: ordered.  Risk Prescription drug management.    This patient presents to the ED for concern of abdominal pain, this involves an extensive number of treatment options, and is a complaint that carries with it a high risk of complications and morbidity.  The differential diagnosis includes gastroenteritis, colitis, small bowel obstruction, appendicitis, cholecystitis, pancreatitis, nephrolithiasis, UTI, pyleonephritis   Co morbidities that complicate the patient evaluation  GERD   Additional history obtained:  Dr. Lequita PCP   Problem List / ED Course / Critical interventions / Medication management  Patient presented for epigastric/RUQ abdominal pain starting around 4 PM earlier today.  Physical exam with  RUQ tenderness to palpation.  Rest of physical exam reassuring.  Patient afebrile and stable vitals. I Ordered, and personally interpreted labs.  hCG negative.  CBC without leukocytosis or anemia.  CMP with elevated AST/ALT at 261/185.  Lipase within normal limits.  UA with few bacteria and trace leukocytes. I ordered imaging studies including RUQ US . I independently visualized and interpreted imaging and I agree with the radiologist interpretation of no acute process. I personally viewed and interpreted the EKG/cardiac monitored which showed an underlying rhythm of: sinus rhythm. Provided patient with IV fluids and rechecked hepatic function panel. AST/ALT continues to elevate now at 601/531. Hepatitis panel pending. Acetaminophen  level pending. Patient follows outpatient with [facility]. I have secured-chatted the on-call provider for tonight to follow with patient in the morning. Consulted with Dr. Franky who agrees to admit patient.  I have reviewed the patients home medicines and have made adjustments as needed   Social Determinants of Health:  none       Final diagnoses:  Elevated liver enzymes    ED Discharge Orders     None          Hoy Nidia FALCON, [place] 11/10/23 0601    Trine Raynell Moder, MD 11/10/23 [phone]

## 2023-11-10 NOTE — Anesthesia Preprocedure Evaluation (Addendum)
 Anesthesia Evaluation  Patient identified by MRN, date of birth, ID band Patient awake    Reviewed: Allergy & Precautions, NPO status , Patient's Chart, lab work & pertinent test results  Airway Mallampati: III  TM Distance: >3 FB Neck ROM: Full    Dental  (+) Teeth Intact, Dental Advisory Given   Pulmonary neg pulmonary ROS   Pulmonary exam normal breath sounds clear to auscultation       Cardiovascular negative cardio ROS Normal cardiovascular exam Rhythm:Regular Rate:Normal     Neuro/Psych negative neurological ROS  negative psych ROS   GI/Hepatic Neg liver ROS,GERD  Controlled,,  Endo/Other  Obesity BMI 32  Renal/GU negative Renal ROS  negative genitourinary   Musculoskeletal negative musculoskeletal ROS (+)    Abdominal   Peds  Hematology negative hematology ROS (+) Hb 11.1, plt 220   Anesthesia Other Findings   Reproductive/Obstetrics Serum preg neg yesterday                               Anesthesia Physical Anesthesia Plan  ASA: 2  Anesthesia Plan: General   Post-op Pain Management: Tylenol  PO (pre-op)*, Toradol  IV (intra-op)*, Ketamine IV* and Dilaudid  IV   Induction: Intravenous  PONV Risk Score and Plan: 4 or greater and Ondansetron , Dexamethasone , Midazolam , Scopolamine  patch - Pre-op and Treatment may vary due to age or medical condition  Airway Management Planned: Oral ETT  Additional Equipment: None  Intra-op Plan:   Post-operative Plan: Extubation in OR  Informed Consent: I have reviewed the patients History and Physical, chart, labs and discussed the procedure including the risks, benefits and alternatives for the proposed anesthesia with the patient or authorized representative who has indicated his/her understanding and acceptance.     Dental advisory given  Plan Discussed with: CRNA  Anesthesia Plan Comments: (Case bumped to tomorrow d/t emergencies  )         Anesthesia Quick Evaluation

## 2023-11-10 NOTE — Consult Note (Signed)
 Anne Whitaker 20-Sep-1990  [id number].    Requesting MD: Dr. 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  She then developed nausea, but no emesis.  She has had some light colored stools about a week ago and since that time has had diarrhea with 2-3 watery stools a day.  She denies every having pain like this.  They pain she had back in 2021 was not like this.  This pain radiated to her chest and to her back.  She denies a burning sensation or reflux type symptoms.  She had a cheese sandwich prior to this episode.  She has had nothing else to eat or drink since that time.  She denies any fevers.  She presented to the ED for evaluation.  She has an US  that reveals no cholecystitis, but cholelithiasis.  She then underwent a CT scan that again showed gallstones, but also possibly mild medial arcuate ligament syndrome.  Her WBC is normal.  Her AST/ALT were 261/185 on admission, but are now up to 629/587 and TB of 1.3 today.  Hepatitis panel is pending.  We have been asked to see her for possible median arcuate ligament syndrome.    ROS: ROS: See HPI  Family History  Problem Relation Age of Onset   Colon cancer Maternal Aunt    Colon cancer Paternal Aunt    Heart disease Maternal Grandmother    Hypertension Maternal Grandfather    Diabetes Maternal Grandfather    Cancer Maternal Grandfather    Brain cancer Maternal Grandfather    Hypertension Paternal Grandmother    Diabetes Paternal Grandmother    Cancer Paternal Grandmother    Stomach cancer Paternal Grandmother    Esophageal cancer Neg Hx    Rectal cancer Neg Hx    Stroke Neg Hx     Past Medical History:  Diagnosis Date   GERD (gastroesophageal reflux disease)    Left shoulder pain     Past Surgical History:  Procedure Laterality Date    CERVICAL CERCLAGE N/A 08/06/2021   Procedure: CERCLAGE CERVICAL;  Surgeon: Lequita Evalene LABOR, MD;  Location: [facility] LD [facility]S;  Service: Gynecology;  Laterality: N/A;   COLONOSCOPY  02/13/2020   UPPER GASTROINTESTINAL ENDOSCOPY  02/13/2020    Social History:  reports that she has never smoked. She has never used smokeless tobacco. She reports that she does not drink alcohol and does not use drugs.  Allergies: No Known Allergies  (Not in a hospital admission)    Physical Exam: Blood pressure 113/86, pulse 61, temperature 98.2 F (36.8 C), temperature source Oral, resp. rate 18, SpO2 94%, unknown if currently breastfeeding. General: pleasant, WD, WN female who is laying in bed in NAD HEENT: head is normocephalic, atraumatic.  Sclera are noninjected.  PERRL.  Ears and nose without any masses or lesions.  Mouth is pink and moist Heart: regular, rate, and rhythm.  Normal s1,s2. No obvious murmurs, gallops, or rubs noted.  Palpable radial and pedal pulses bilaterally Lungs: CTAB, no wheezes, rhonchi, or rales noted.  Respiratory effort nonlabored Abd: soft, tender in epigastrium but greatest in RUQ, ND, +BS, no masses, hernias, or organomegaly MS: all 4 extremities are symmetrical with no cyanosis, clubbing, or edema. Psych: A&Ox3 with an appropriate affect.   Results for orders placed or performed during the hospital encounter of  11/09/23 (from the past 48 hours)  Lipase, blood     Status: None   Collection Time: 11/09/23 10:21 PM  Result Value Ref Range   Lipase 27 11 - 51 U/L    Comment: Performed at [facility], [address], [place], [place] [address]  Comprehensive metabolic panel     Status: Abnormal   Collection Time: 11/09/23 10:21 PM  Result Value Ref Range   Sodium 139 135 - 145 mmol/L   Potassium 3.9 3.5 - 5.1 mmol/L   Chloride 101 98 - 111 mmol/L   CO2 27 22 - 32 mmol/L   Glucose, Bld 103 (H) 70 - 99 mg/dL    Comment: Glucose reference range applies only  to samples taken after fasting for at least 8 hours.   BUN 7 6 - 20 mg/dL   Creatinine, Ser 9.47 0.44 - 1.00 mg/dL   Calcium  10.0 8.9 - 10.3 mg/dL   Total Protein 7.1 6.5 - 8.1 g/dL   Albumin 4.6 3.5 - 5.0 g/dL   AST 738 (H) 15 - 41 U/L   ALT 185 (H) 0 - 44 U/L   Alkaline Phosphatase 117 38 - 126 U/L   Total Bilirubin 0.9 0.0 - 1.2 mg/dL   GFR, Estimated >39 >39 mL/min    Comment: (NOTE) Calculated using the CKD-EPI Creatinine Equation (2021)    Anion gap 10 5 - 15    Comment: Performed at [facility], [address], [place], [place] [address]  CBC     Status: None   Collection Time: 11/09/23 10:21 PM  Result Value Ref Range   WBC 8.3 4.0 - 10.5 K/uL   RBC 4.19 3.87 - 5.11 MIL/uL   Hemoglobin 12.4 12.0 - 15.0 g/dL   HCT 62.3 63.9 - 53.9 %   MCV 89.7 80.0 - 100.0 fL   MCH 29.6 26.0 - 34.0 pg   MCHC 33.0 30.0 - 36.0 g/dL   RDW 87.1 88.4 - 84.4 %   Platelets 266 150 - 400 K/uL   nRBC 0.0 0.0 - 0.2 %    Comment: Performed at [facility], [address] [place], [place] [address]  hCG, serum, qualitative     Status: None   Collection Time: 11/09/23 10:21 PM  Result Value Ref Range   Preg, Serum NEGATIVE NEGATIVE    Comment:        THE SENSITIVITY OF THIS METHODOLOGY IS >10 mIU/mL. Performed at [facility], [address], [place], [place] [address]   Urinalysis, Routine w reflex microscopic -Urine, Clean Catch     Status: Abnormal   Collection Time: 11/10/23 12:43 AM  Result Value Ref Range   Color, Urine AMBER (A) YELLOW    Comment: BIOCHEMICALS MAY BE AFFECTED BY COLOR   APPearance CLOUDY (A) CLEAR   Specific Gravity, Urine 1.014 1.005 - 1.030   pH 7.0 5.0 - 8.0   Glucose, UA NEGATIVE NEGATIVE mg/dL   Hgb urine dipstick LARGE (A) NEGATIVE   Bilirubin Urine NEGATIVE NEGATIVE   Ketones, ur NEGATIVE NEGATIVE mg/dL   Protein, ur 30 (A) NEGATIVE mg/dL   Nitrite NEGATIVE NEGATIVE   Leukocytes,Ua TRACE (A)  NEGATIVE   RBC / HPF >50 0 - 5 RBC/hpf   WBC, UA >50 0 - 5 WBC/hpf   Bacteria, UA FEW (A) NONE SEEN   Squamous Epithelial / HPF 0-5 0 - 5 /HPF   Mucus PRESENT    Amorphous Crystal PRESENT  Comment: Performed at [facility], [address] [place], [place] [address]  Hepatic function panel     Status: Abnormal   Collection Time: 11/10/23  4:29 AM  Result Value Ref Range   Total Protein 5.8 (L) 6.5 - 8.1 g/dL   Albumin 3.8 3.5 - 5.0 g/dL   AST 398 (H) 15 - 41 U/L   ALT 531 (H) 0 - 44 U/L   Alkaline Phosphatase 116 38 - 126 U/L   Total Bilirubin 1.2 0.0 - 1.2 mg/dL   Bilirubin, Direct 0.9 (H) 0.0 - 0.2 mg/dL   Indirect Bilirubin 0.4 0.3 - 0.9 mg/dL    Comment: Performed at [facility], [address] [place], [place] [address]  Acetaminophen  level     Status: Abnormal   Collection Time: 11/10/23  5:56 AM  Result Value Ref Range   Acetaminophen  (Tylenol ), Serum <10 (L) 10 - 30 ug/mL    Comment: (NOTE) Toxic concentrations can be more effectively related to post dose interval; > 200, > 100, and > 50 ug/mL serum concentrations correspond to toxic concentrations at 4, 8, and 12 hours post dose, respectively.  Performed at [facility], [address] [place], [place] [address]   Basic metabolic panel     Status: Abnormal   Collection Time: 11/10/23  5:56 AM  Result Value Ref Range   Sodium 143 135 - 145 mmol/L   Potassium 3.3 (L) 3.5 - 5.1 mmol/L   Chloride 111 98 - 111 mmol/L   CO2 22 22 - 32 mmol/L   Glucose, Bld 94 70 - 99 mg/dL    Comment: Glucose reference range applies only to samples taken after fasting for at least 8 hours.   BUN 6 6 - 20 mg/dL   Creatinine, Ser 9.51 0.44 - 1.00 mg/dL   Calcium  7.5 (L) 8.9 - 10.3 mg/dL    Comment: Delta check noted    GFR, Estimated >60 >60 mL/min    Comment: (NOTE) Calculated using the CKD-EPI Creatinine Equation (2021)    Anion gap 11 5 - 15    Comment: Performed at  [facility], [address], [place], [place] [address]  Hepatic function panel     Status: Abnormal   Collection Time: 11/10/23  5:56 AM  Result Value Ref Range   Total Protein 5.3 (L) 6.5 - 8.1 g/dL   Albumin 3.5 3.5 - 5.0 g/dL   AST 370 (H) 15 - 41 U/L   ALT 587 (H) 0 - 44 U/L   Alkaline Phosphatase 110 38 - 126 U/L   Total Bilirubin 1.3 (H) 0.0 - 1.2 mg/dL   Bilirubin, Direct 0.9 (H) 0.0 - 0.2 mg/dL   Indirect Bilirubin 0.4 0.3 - 0.9 mg/dL    Comment: Performed at [facility], [address], [place], [place] [address]  CBC with Differential/Platelet     Status: Abnormal   Collection Time: 11/10/23  5:56 AM  Result Value Ref Range   WBC 6.3 4.0 - 10.5 K/uL   RBC 3.68 (L) 3.87 - 5.11 MIL/uL   Hemoglobin 11.1 (L) 12.0 - 15.0 g/dL   HCT 66.5 (L) 63.9 - 53.9 %   MCV 90.8 80.0 - 100.0 fL   MCH 30.2 26.0 - 34.0 pg   MCHC 33.2 30.0 - 36.0 g/dL   RDW 86.9 88.4 - 84.4 %   Platelets 220 150 - 400 K/uL   nRBC 0.0 0.0 - 0.2 %  Neutrophils Relative % 53 %   Neutro Abs 3.3 1.7 - 7.7 K/uL   Lymphocytes Relative 37 %   Lymphs Abs 2.3 0.7 - 4.0 K/uL   Monocytes Relative 8 %   Monocytes Absolute 0.5 0.1 - 1.0 K/uL   Eosinophils Relative 1 %   Eosinophils Absolute 0.0 0.0 - 0.5 K/uL   Basophils Relative 0 %   Basophils Absolute 0.0 0.0 - 0.1 K/uL   Immature Granulocytes 1 %   Abs Immature Granulocytes 0.04 0.00 - 0.07 K/uL    Comment: Performed at [facility], [address], [place], [place] [address]  Troponin T, High Sensitivity     Status: None   Collection Time: 11/10/23  5:56 AM  Result Value Ref Range   Troponin T High Sensitivity <15 0 - 19 ng/L    Comment: (NOTE) Biotin concentrations > 1000 ng/mL falsely decrease TnT results.  Serial cardiac troponin measurements are suggested.  Refer to the Links section for chest pain algorithms and additional  guidance. Performed at [facility], [address] [place], [place] [address]   Lactic acid, plasma     Status: None   Collection Time: 11/10/23  6:53 AM  Result Value Ref Range   Lactic Acid, Venous 1.7 0.5 - 1.9 mmol/L    Comment: Performed at [facility], [address], [place], [place] [address]  Protime-INR     Status: None   Collection Time: 11/10/23  6:53 AM  Result Value Ref Range   Prothrombin Time 14.1 11.4 - 15.2 seconds   INR 1.0 0.8 - 1.2    Comment: (NOTE) INR goal varies based on device and disease states. Performed at [facility], [address] [place], [place] [address]    CT Angio Chest/Abd/Pel for Dissection W and/or W/WO Result Date: 11/10/2023 CLINICAL DATA:  Chest and abdominal pain EXAM: CT ANGIOGRAPHY CHEST, ABDOMEN AND PELVIS TECHNIQUE: Non-contrast CT of the chest was initially obtained. Multidetector CT imaging through the chest, abdomen and pelvis was performed using the standard protocol during bolus administration of intravenous contrast. Multiplanar reconstructed images and MIPs were obtained and reviewed to evaluate the vascular anatomy. RADIATION DOSE REDUCTION: This exam was performed according to the departmental dose-optimization program which includes automated exposure control, adjustment of the mA and/or kV according to patient size and/or use of iterative reconstruction technique. CONTRAST:  OMNIPAQUE  IOHEXOL  350 MG/ML SOLN COMPARISON:  None Available. FINDINGS: CTA CHEST FINDINGS Cardiovascular: Initial precontrast images show no acute abnormality. Post-contrast images demonstrate the thoracic aorta to have no aneurysmal dilatation or dissection. The heart is at the upper limits of normal in size. Pulmonary artery shows a normal branching pattern without intraluminal filling defect. Mediastinum/Nodes: Thoracic inlet is within normal limits. No hilar or mediastinal adenopathy is noted. The esophagus as visualized is within normal limits.  Lungs/Pleura: Lungs are well aerated bilaterally. No focal infiltrate or sizable effusion is seen. No parenchymal nodules are noted. Musculoskeletal: No chest wall abnormality. No acute or significant osseous findings. Review of the MIP images confirms the above findings. CTA ABDOMEN AND PELVIS FINDINGS VASCULAR Aorta: Abdominal aorta demonstrates no atherosclerotic calcification. No aneurysmal dilatation or dissection is seen. Celiac: Median arcuate ligament compression is noted. Mild poststenotic dilatation is seen. SMA: Patent without evidence of aneurysm, dissection, vasculitis or significant stenosis. Renals: Both renal arteries are patent without evidence of aneurysm, dissection, vasculitis, fibromuscular dysplasia or significant stenosis. Dual renal arteries are noted on the  right. IMA: Patent without evidence of aneurysm, dissection, vasculitis or significant stenosis. Inflow: Iliacs are well visualized and within normal limits. Veins: No specific venous abnormality is noted. Review of the MIP images confirms the above findings. NON-VASCULAR Hepatobiliary: No focal liver abnormality is seen. No gallstones, gallbladder wall thickening, or biliary dilatation. Pancreas: Unremarkable. No pancreatic ductal dilatation or surrounding inflammatory changes. Spleen: Normal in size without focal abnormality. Adrenals/Urinary Tract: Adrenal glands are within normal limits. Kidneys are well visualized bilaterally. No renal calculi or obstructive changes are noted. The bladder is partially distended. Stomach/Bowel: No obstructive or inflammatory changes of the colon are noted. The appendix is within normal limits. Small bowel and stomach are unremarkable. Lymphatic: No lymphadenopathy is noted. Reproductive: Uterus and bilateral adnexa are unremarkable. Other: No abdominal wall hernia or abnormality. No abdominopelvic ascites. Musculoskeletal: No acute or significant osseous findings. Review of the MIP images confirms the  above findings. IMPRESSION: CTA of the chest: No evidence of pulmonary emboli. No acute aortic abnormality is seen. No acute abnormality seen. CTA of the abdomen and pelvis: No acute aortic abnormality is noted. 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 No other focal abnormality is noted. Electronically Signed   By: Oneil Devonshire M.D.   On: 11/10/2023 03:12   US  Abdomen Limited RUQ (LIVER/GB) Result Date: 11/10/2023 CLINICAL DATA:  151471 RUQ pain 151471 EXAM: ULTRASOUND ABDOMEN LIMITED RIGHT UPPER QUADRANT COMPARISON:  None Available. FINDINGS: Gallbladder: Subcentimeter calcified gallstone within the gallbladder lumen. No gallbladder wall thickening or pericholecystic fluid visualized. No sonographic Murphy sign noted by sonographer. Common bile duct: Diameter: 2 mm Liver: No focal lesion identified. Increased parenchymal echogenicity. Portal vein is patent on color Doppler imaging with normal direction of blood flow towards the liver. Other: None. IMPRESSION: 1. Cholelithiasis with no acute cholecystitis. 2. Hepatic steatosis. Please note limited evaluation for focal hepatic masses in a patient with hepatic steatosis due to decreased penetration of the acoustic ultrasound waves. Electronically Signed   By: Morgane  Naveau M.D.   On: 11/10/2023 01:48      Assessment/Plan Epigastric and RUQ abdominal pain with elevated LFTs and cholelithiasis The patient has been seen, examined, labs, vitals, chart, and imaging personally reviewed.  They patient does not have convincing evidence of medial arcuate ligament syndrome.  Her celiac vessel does not really appears significantly narrowed.  She also doesn't have other findings such as duodenal/gastric dilatation which is often seen with this as well.  Her symptoms are much more c/w biliary disease.  Her LFTs are also significantly elevated today.  Her hepatitis panel is pending currently.  The patient does have gallstones and with her  symptoms, location of pain, and labs, we have recommended proceeding to the OR for lap chole with IOC today vs tomorrow as OR time allows.  I have discussed this with the patient and she understands and agrees with this plan.     FEN - NPO/IVFs per primary VTE - SCDs ID - Rocephin  on call to OR Admit - per medicine  I reviewed nursing notes, ED provider notes, hospitalist notes, last 24 h vitals and pain scores, last 48 h intake and output, last 24 h labs and trends, and last 24 h imaging results.  Burnard FORBES Banter, [facility] Surgery 11/10/2023, 8:37 AM Please see Amion for pager number during day hours 7:00am-4:30pm or 7:00am -11:30am on weekends

## 2023-11-10 NOTE — Plan of Care (Signed)

## 2023-11-10 NOTE — H&P (Addendum)
 History and Physical    Anne Whitaker FMW:[id number] DOB: 30-May-1990 DOA: 11/09/2023  Patient coming from: Home.  Chief Complaint: Abdominal pain.  HPI: Anne Whitaker is a 33 y.o. female with no significant past medical history presents to the ER with complaints of abdominal pain.  Patient states that she started experiencing sudden onset of severe abdominal pain in the epigastric area radiating to the right upper quadrant with nausea but no vomiting.  Pain was persistent and was not related to food.  Denies any diarrhea fever chills denies any recent travel or sick contacts.  Patient denies taking any medications.  Due to persistent pain patient presents to the ER.    Patient states over the last one week she had some vague upper back discomfort like something was catching on her back.  She also had some sensation of itching all over the body 3 days ago and also today.  No rash.  Patient had previously followed with [facility] in 2021 with abdominal discomfort at the time patient had EGD and colonoscopy which were unremarkable.  ED Course: In the ER patient's labs show elevated AST and ALT with normal lipase alkaline phosphatase and bilirubin.  Tylenol  levels undetectable.  Patient was afebrile.  Pregnancy screen was negative.  Ultrasound of the abdomen shows gallstones but no evidence of cholecystitis.  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  Patient admitted for further management.  Review of Systems: As per HPI, rest all negative.   Past Medical History:  Diagnosis Date   GERD (gastroesophageal reflux disease)    Left shoulder pain     Past Surgical History:  Procedure Laterality Date   CERVICAL CERCLAGE N/A 08/06/2021   Procedure: CERCLAGE CERVICAL;  Surgeon: Lequita Evalene LABOR, MD;  Location: [facility] LD ORS;  Service: Gynecology;  Laterality:  N/A;   COLONOSCOPY  02/13/2020   UPPER GASTROINTESTINAL ENDOSCOPY  02/13/2020     reports that she has never smoked. She has never used smokeless tobacco. She reports that she does not drink alcohol and does not use drugs.  No Known Allergies  Family History  Problem Relation Age of Onset   Colon cancer Maternal Aunt    Colon cancer Paternal Aunt    Heart disease Maternal Grandmother    Hypertension Maternal Grandfather    Diabetes Maternal Grandfather    Cancer Maternal Grandfather    Brain cancer Maternal Grandfather    Hypertension Paternal Grandmother    Diabetes Paternal Grandmother    Cancer Paternal Grandmother    Stomach cancer Paternal Grandmother    Esophageal cancer Neg Hx    Rectal cancer Neg Hx    Stroke Neg Hx     Prior to Admission medications   Medication Sig Start Date End Date Taking? Authorizing Provider  Multiple Vitamin (MULTIVITAMIN) tablet Take 1 tablet by mouth daily.    [provider]    Physical Exam: Constitutional: Moderately built and nourished. Vitals:   11/10/23 0200 11/10/23 0400 11/10/23 0431 11/10/23 0556  BP: 115/68   127/77  Pulse: 69   62  Resp: 17   19  Temp:  98.5 F (36.9 C) 98.5 F (36.9 C)   TempSrc:  Oral Oral   SpO2: 99%   94%   Eyes: Anicteric no pallor. ENMT: No discharge from the ears eyes nose or mouth. Neck: No mass felt.  No neck  rigidity. Respiratory: No rhonchi or crepitations. Cardiovascular: S1-S2 heard. Abdomen: Soft mild epigastric tenderness no guarding or rigidity. Musculoskeletal: No edema. Skin: No rash. Neurologic: Alert awake oriented time place and person.  Moves all extremities. Psychiatric: Appears normal.  Normal affect.   Labs on Admission: I have personally reviewed following labs and imaging studies  CBC: Recent Labs  Lab 11/09/23 2221  WBC 8.3  HGB 12.4  HCT 37.6  MCV 89.7  PLT 266   Basic Metabolic Panel: Recent Labs  Lab 11/09/23 2221  NA 139  K 3.9  CL 101   CO2 27  GLUCOSE 103*  BUN 7  CREATININE 0.52  CALCIUM  10.0   GFR: CrCl cannot be calculated (Unknown ideal weight.). Liver Function Tests: Recent Labs  Lab 11/09/23 2221 11/10/23 0429  AST 261* 601*  ALT 185* 531*  ALKPHOS 117 116  BILITOT 0.9 1.2  PROT 7.1 5.8*  ALBUMIN 4.6 3.8   Recent Labs  Lab 11/09/23 2221  LIPASE 27   No results for input(s): AMMONIA in the last 168 hours. Coagulation Profile: No results for input(s): INR, PROTIME in the last 168 hours. Cardiac Enzymes: No results for input(s): CKTOTAL, CKMB, CKMBINDEX, TROPONINI in the last 168 hours. BNP (last 3 results) No results for input(s): PROBNP in the last 8760 hours. HbA1C: No results for input(s): HGBA1C in the last 72 hours. CBG: No results for input(s): GLUCAP in the last 168 hours. Lipid Profile: No results for input(s): CHOL, HDL, LDLCALC, TRIG, CHOLHDL, LDLDIRECT in the last 72 hours. Thyroid Function Tests: No results for input(s): TSH, T4TOTAL, FREET4, T3FREE, THYROIDAB in the last 72 hours. Anemia Panel: No results for input(s): VITAMINB12, FOLATE, FERRITIN, TIBC, IRON, RETICCTPCT in the last 72 hours. Urine analysis:    Component Value Date/Time   COLORURINE AMBER (A) 11/10/2023 0043   APPEARANCEUR CLOUDY (A) 11/10/2023 0043   LABSPEC 1.014 11/10/2023 0043   PHURINE 7.0 11/10/2023 0043   GLUCOSEU NEGATIVE 11/10/2023 0043   HGBUR LARGE (A) 11/10/2023 0043   BILIRUBINUR NEGATIVE 11/10/2023 0043   BILIRUBINUR small (A) 12/20/2019 1724   KETONESUR NEGATIVE 11/10/2023 0043   PROTEINUR 30 (A) 11/10/2023 0043   UROBILINOGEN 1.0 12/20/2019 1724   NITRITE NEGATIVE 11/10/2023 0043   LEUKOCYTESUR TRACE (A) 11/10/2023 0043   Sepsis Labs: @LABRCNTIP (procalcitonin:4,lacticidven:4) )No results found for this or any previous visit (from the past 240 hours).   Radiological Exams on Admission: CT Angio Chest/Abd/Pel for Dissection W and/or  W/WO Result Date: 11/10/2023 CLINICAL DATA:  Chest and abdominal pain EXAM: CT ANGIOGRAPHY CHEST, ABDOMEN AND PELVIS TECHNIQUE: Non-contrast CT of the chest was initially obtained. Multidetector CT imaging through the chest, abdomen and pelvis was performed using the standard protocol during bolus administration of intravenous contrast. Multiplanar reconstructed images and MIPs were obtained and reviewed to evaluate the vascular anatomy. RADIATION DOSE REDUCTION: This exam was performed according to the departmental dose-optimization program which includes automated exposure control, adjustment of the mA and/or kV according to patient size and/or use of iterative reconstruction technique. CONTRAST:  OMNIPAQUE  IOHEXOL  350 MG/ML SOLN COMPARISON:  None Available. FINDINGS: CTA CHEST FINDINGS Cardiovascular: Initial precontrast images show no acute abnormality. Post-contrast images demonstrate the thoracic aorta to have no aneurysmal dilatation or dissection. The heart is at the upper limits of normal in size. Pulmonary artery shows a normal branching pattern without intraluminal filling defect. Mediastinum/Nodes: Thoracic inlet is within normal limits. No hilar or mediastinal adenopathy is noted. The esophagus as visualized is within normal limits.  Lungs/Pleura: Lungs are well aerated bilaterally. No focal infiltrate or sizable effusion is seen. No parenchymal nodules are noted. Musculoskeletal: No chest wall abnormality. No acute or significant osseous findings. Review of the MIP images confirms the above findings. CTA ABDOMEN AND PELVIS FINDINGS VASCULAR Aorta: Abdominal aorta demonstrates no atherosclerotic calcification. No aneurysmal dilatation or dissection is seen. Celiac: Median arcuate ligament compression is noted. Mild poststenotic dilatation is seen. SMA: Patent without evidence of aneurysm, dissection, vasculitis or significant stenosis. Renals: Both renal arteries are patent without evidence of  aneurysm, dissection, vasculitis, fibromuscular dysplasia or significant stenosis. Dual renal arteries are noted on the right. IMA: Patent without evidence of aneurysm, dissection, vasculitis or significant stenosis. Inflow: Iliacs are well visualized and within normal limits. Veins: No specific venous abnormality is noted. Review of the MIP images confirms the above findings. NON-VASCULAR Hepatobiliary: No focal liver abnormality is seen. No gallstones, gallbladder wall thickening, or biliary dilatation. Pancreas: Unremarkable. No pancreatic ductal dilatation or surrounding inflammatory changes. Spleen: Normal in size without focal abnormality. Adrenals/Urinary Tract: Adrenal glands are within normal limits. Kidneys are well visualized bilaterally. No renal calculi or obstructive changes are noted. The bladder is partially distended. Stomach/Bowel: No obstructive or inflammatory changes of the colon are noted. The appendix is within normal limits. Small bowel and stomach are unremarkable. Lymphatic: No lymphadenopathy is noted. Reproductive: Uterus and bilateral adnexa are unremarkable. Other: No abdominal wall hernia or abnormality. No abdominopelvic ascites. Musculoskeletal: No acute or significant osseous findings. Review of the MIP images confirms the above findings. IMPRESSION: CTA of the chest: No evidence of pulmonary emboli. No acute aortic abnormality is seen. No acute abnormality seen. CTA of the abdomen and pelvis: No acute aortic abnormality is noted. 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 No other focal abnormality is noted. Electronically Signed   By: Oneil Devonshire M.D.   On: 11/10/2023 03:12   US  Abdomen Limited RUQ (LIVER/GB) Result Date: 11/10/2023 CLINICAL DATA:  151471 RUQ pain 151471 EXAM: ULTRASOUND ABDOMEN LIMITED RIGHT UPPER QUADRANT COMPARISON:  None Available. FINDINGS: Gallbladder: Subcentimeter calcified gallstone within the gallbladder lumen. No  gallbladder wall thickening or pericholecystic fluid visualized. No sonographic Murphy sign noted by sonographer. Common bile duct: Diameter: 2 mm Liver: No focal lesion identified. Increased parenchymal echogenicity. Portal vein is patent on color Doppler imaging with normal direction of blood flow towards the liver. Other: None. IMPRESSION: 1. Cholelithiasis with no acute cholecystitis. 2. Hepatic steatosis. Please note limited evaluation for focal hepatic masses in a patient with hepatic steatosis due to decreased penetration of the acoustic ultrasound waves. Electronically Signed   By: Morgane  Naveau M.D.   On: 11/10/2023 01:48    EKG: Independently reviewed.  Normal sinus rhythm.  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 Rubin who will see patient in consult.  [facility] also has been consulted.  Keep patient n.p.o. for now.  Repeat LFTs.  Check lactic acid levels.  IV fluids pain relief medications. Abnormal UA but patient denies any symptoms of UTI.  Patient states she is on her menstrual cycle now.  Will check urine cultures. Normocytic normochromic anemia -     follow CBC.  Check anemia panel with next blood draw.  Since patient has  abdominal pain with gallstones and elevated LFTs will need further management and more than 2 midnight stay.   DVT prophylaxis: SCDs.  Holding pharmacological DVT prophylaxis in anticipation of possible procedure. Code Status: Full code. Family Communication: Discussed with patient. Disposition Plan: Medical floor. Consults called: General Surgery and GI. Admission status: Observation.

## 2023-11-10 NOTE — Hospital Course (Addendum)
 33 year old woman no significant PMH presented with abdominal pain.  Admitted for further evaluation of the same.  CT was concerning for median arcuate ligament compression, evaluated by surgery, felt to be biliary colic, plans made for cholecystectomy, delayed by unrelated emergent case.  Consultants General surgery GI  Procedures/Events 9/16 admit

## 2023-11-10 NOTE — H&P (View-Only) (Signed)
 Anne Whitaker 20-Sep-1990  [id number].    Requesting MD: Dr. 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  She then developed nausea, but no emesis.  She has had some light colored stools about a week ago and since that time has had diarrhea with 2-3 watery stools a day.  She denies every having pain like this.  They pain she had back in 2021 was not like this.  This pain radiated to her chest and to her back.  She denies a burning sensation or reflux type symptoms.  She had a cheese sandwich prior to this episode.  She has had nothing else to eat or drink since that time.  She denies any fevers.  She presented to the ED for evaluation.  She has an US  that reveals no cholecystitis, but cholelithiasis.  She then underwent a CT scan that again showed gallstones, but also possibly mild medial arcuate ligament syndrome.  Her WBC is normal.  Her AST/ALT were 261/185 on admission, but are now up to 629/587 and TB of 1.3 today.  Hepatitis panel is pending.  We have been asked to see her for possible median arcuate ligament syndrome.    ROS: ROS: See HPI  Family History  Problem Relation Age of Onset   Colon cancer Maternal Aunt    Colon cancer Paternal Aunt    Heart disease Maternal Grandmother    Hypertension Maternal Grandfather    Diabetes Maternal Grandfather    Cancer Maternal Grandfather    Brain cancer Maternal Grandfather    Hypertension Paternal Grandmother    Diabetes Paternal Grandmother    Cancer Paternal Grandmother    Stomach cancer Paternal Grandmother    Esophageal cancer Neg Hx    Rectal cancer Neg Hx    Stroke Neg Hx     Past Medical History:  Diagnosis Date   GERD (gastroesophageal reflux disease)    Left shoulder pain     Past Surgical History:  Procedure Laterality Date    CERVICAL CERCLAGE N/A 08/06/2021   Procedure: CERCLAGE CERVICAL;  Surgeon: Lequita Evalene LABOR, MD;  Location: [facility] LD [facility]S;  Service: Gynecology;  Laterality: N/A;   COLONOSCOPY  02/13/2020   UPPER GASTROINTESTINAL ENDOSCOPY  02/13/2020    Social History:  reports that she has never smoked. She has never used smokeless tobacco. She reports that she does not drink alcohol and does not use drugs.  Allergies: No Known Allergies  (Not in a hospital admission)    Physical Exam: Blood pressure 113/86, pulse 61, temperature 98.2 F (36.8 C), temperature source Oral, resp. rate 18, SpO2 94%, unknown if currently breastfeeding. General: pleasant, WD, WN female who is laying in bed in NAD HEENT: head is normocephalic, atraumatic.  Sclera are noninjected.  PERRL.  Ears and nose without any masses or lesions.  Mouth is pink and moist Heart: regular, rate, and rhythm.  Normal s1,s2. No obvious murmurs, gallops, or rubs noted.  Palpable radial and pedal pulses bilaterally Lungs: CTAB, no wheezes, rhonchi, or rales noted.  Respiratory effort nonlabored Abd: soft, tender in epigastrium but greatest in RUQ, ND, +BS, no masses, hernias, or organomegaly MS: all 4 extremities are symmetrical with no cyanosis, clubbing, or edema. Psych: A&Ox3 with an appropriate affect.   Results for orders placed or performed during the hospital encounter of  11/09/23 (from the past 48 hours)  Lipase, blood     Status: None   Collection Time: 11/09/23 10:21 PM  Result Value Ref Range   Lipase 27 11 - 51 U/L    Comment: Performed at [facility], [address], [place], [place] [address]  Comprehensive metabolic panel     Status: Abnormal   Collection Time: 11/09/23 10:21 PM  Result Value Ref Range   Sodium 139 135 - 145 mmol/L   Potassium 3.9 3.5 - 5.1 mmol/L   Chloride 101 98 - 111 mmol/L   CO2 27 22 - 32 mmol/L   Glucose, Bld 103 (H) 70 - 99 mg/dL    Comment: Glucose reference range applies only  to samples taken after fasting for at least 8 hours.   BUN 7 6 - 20 mg/dL   Creatinine, Ser 9.47 0.44 - 1.00 mg/dL   Calcium  10.0 8.9 - 10.3 mg/dL   Total Protein 7.1 6.5 - 8.1 g/dL   Albumin 4.6 3.5 - 5.0 g/dL   AST 738 (H) 15 - 41 U/L   ALT 185 (H) 0 - 44 U/L   Alkaline Phosphatase 117 38 - 126 U/L   Total Bilirubin 0.9 0.0 - 1.2 mg/dL   GFR, Estimated >39 >39 mL/min    Comment: (NOTE) Calculated using the CKD-EPI Creatinine Equation (2021)    Anion gap 10 5 - 15    Comment: Performed at [facility], [address], [place], [place] [address]  CBC     Status: None   Collection Time: 11/09/23 10:21 PM  Result Value Ref Range   WBC 8.3 4.0 - 10.5 K/uL   RBC 4.19 3.87 - 5.11 MIL/uL   Hemoglobin 12.4 12.0 - 15.0 g/dL   HCT 62.3 63.9 - 53.9 %   MCV 89.7 80.0 - 100.0 fL   MCH 29.6 26.0 - 34.0 pg   MCHC 33.0 30.0 - 36.0 g/dL   RDW 87.1 88.4 - 84.4 %   Platelets 266 150 - 400 K/uL   nRBC 0.0 0.0 - 0.2 %    Comment: Performed at [facility], [address] [place], [place] [address]  hCG, serum, qualitative     Status: None   Collection Time: 11/09/23 10:21 PM  Result Value Ref Range   Preg, Serum NEGATIVE NEGATIVE    Comment:        THE SENSITIVITY OF THIS METHODOLOGY IS >10 mIU/mL. Performed at [facility], [address], [place], [place] [address]   Urinalysis, Routine w reflex microscopic -Urine, Clean Catch     Status: Abnormal   Collection Time: 11/10/23 12:43 AM  Result Value Ref Range   Color, Urine AMBER (A) YELLOW    Comment: BIOCHEMICALS MAY BE AFFECTED BY COLOR   APPearance CLOUDY (A) CLEAR   Specific Gravity, Urine 1.014 1.005 - 1.030   pH 7.0 5.0 - 8.0   Glucose, UA NEGATIVE NEGATIVE mg/dL   Hgb urine dipstick LARGE (A) NEGATIVE   Bilirubin Urine NEGATIVE NEGATIVE   Ketones, ur NEGATIVE NEGATIVE mg/dL   Protein, ur 30 (A) NEGATIVE mg/dL   Nitrite NEGATIVE NEGATIVE   Leukocytes,Ua TRACE (A)  NEGATIVE   RBC / HPF >50 0 - 5 RBC/hpf   WBC, UA >50 0 - 5 WBC/hpf   Bacteria, UA FEW (A) NONE SEEN   Squamous Epithelial / HPF 0-5 0 - 5 /HPF   Mucus PRESENT    Amorphous Crystal PRESENT  Comment: Performed at [facility], [address] [place], [place] [address]  Hepatic function panel     Status: Abnormal   Collection Time: 11/10/23  4:29 AM  Result Value Ref Range   Total Protein 5.8 (L) 6.5 - 8.1 g/dL   Albumin 3.8 3.5 - 5.0 g/dL   AST 398 (H) 15 - 41 U/L   ALT 531 (H) 0 - 44 U/L   Alkaline Phosphatase 116 38 - 126 U/L   Total Bilirubin 1.2 0.0 - 1.2 mg/dL   Bilirubin, Direct 0.9 (H) 0.0 - 0.2 mg/dL   Indirect Bilirubin 0.4 0.3 - 0.9 mg/dL    Comment: Performed at [facility], [address] [place], [place] [address]  Acetaminophen  level     Status: Abnormal   Collection Time: 11/10/23  5:56 AM  Result Value Ref Range   Acetaminophen  (Tylenol ), Serum <10 (L) 10 - 30 ug/mL    Comment: (NOTE) Toxic concentrations can be more effectively related to post dose interval; > 200, > 100, and > 50 ug/mL serum concentrations correspond to toxic concentrations at 4, 8, and 12 hours post dose, respectively.  Performed at [facility], [address] [place], [place] [address]   Basic metabolic panel     Status: Abnormal   Collection Time: 11/10/23  5:56 AM  Result Value Ref Range   Sodium 143 135 - 145 mmol/L   Potassium 3.3 (L) 3.5 - 5.1 mmol/L   Chloride 111 98 - 111 mmol/L   CO2 22 22 - 32 mmol/L   Glucose, Bld 94 70 - 99 mg/dL    Comment: Glucose reference range applies only to samples taken after fasting for at least 8 hours.   BUN 6 6 - 20 mg/dL   Creatinine, Ser 9.51 0.44 - 1.00 mg/dL   Calcium  7.5 (L) 8.9 - 10.3 mg/dL    Comment: Delta check noted    GFR, Estimated >60 >60 mL/min    Comment: (NOTE) Calculated using the CKD-EPI Creatinine Equation (2021)    Anion gap 11 5 - 15    Comment: Performed at  [facility], [address], [place], [place] [address]  Hepatic function panel     Status: Abnormal   Collection Time: 11/10/23  5:56 AM  Result Value Ref Range   Total Protein 5.3 (L) 6.5 - 8.1 g/dL   Albumin 3.5 3.5 - 5.0 g/dL   AST 370 (H) 15 - 41 U/L   ALT 587 (H) 0 - 44 U/L   Alkaline Phosphatase 110 38 - 126 U/L   Total Bilirubin 1.3 (H) 0.0 - 1.2 mg/dL   Bilirubin, Direct 0.9 (H) 0.0 - 0.2 mg/dL   Indirect Bilirubin 0.4 0.3 - 0.9 mg/dL    Comment: Performed at [facility], [address], [place], [place] [address]  CBC with Differential/Platelet     Status: Abnormal   Collection Time: 11/10/23  5:56 AM  Result Value Ref Range   WBC 6.3 4.0 - 10.5 K/uL   RBC 3.68 (L) 3.87 - 5.11 MIL/uL   Hemoglobin 11.1 (L) 12.0 - 15.0 g/dL   HCT 66.5 (L) 63.9 - 53.9 %   MCV 90.8 80.0 - 100.0 fL   MCH 30.2 26.0 - 34.0 pg   MCHC 33.2 30.0 - 36.0 g/dL   RDW 86.9 88.4 - 84.4 %   Platelets 220 150 - 400 K/uL   nRBC 0.0 0.0 - 0.2 %  Neutrophils Relative % 53 %   Neutro Abs 3.3 1.7 - 7.7 K/uL   Lymphocytes Relative 37 %   Lymphs Abs 2.3 0.7 - 4.0 K/uL   Monocytes Relative 8 %   Monocytes Absolute 0.5 0.1 - 1.0 K/uL   Eosinophils Relative 1 %   Eosinophils Absolute 0.0 0.0 - 0.5 K/uL   Basophils Relative 0 %   Basophils Absolute 0.0 0.0 - 0.1 K/uL   Immature Granulocytes 1 %   Abs Immature Granulocytes 0.04 0.00 - 0.07 K/uL    Comment: Performed at [facility], [address], [place], [place] [address]  Troponin T, High Sensitivity     Status: None   Collection Time: 11/10/23  5:56 AM  Result Value Ref Range   Troponin T High Sensitivity <15 0 - 19 ng/L    Comment: (NOTE) Biotin concentrations > 1000 ng/mL falsely decrease TnT results.  Serial cardiac troponin measurements are suggested.  Refer to the Links section for chest pain algorithms and additional  guidance. Performed at [facility], [address] [place], [place] [address]   Lactic acid, plasma     Status: None   Collection Time: 11/10/23  6:53 AM  Result Value Ref Range   Lactic Acid, Venous 1.7 0.5 - 1.9 mmol/L    Comment: Performed at [facility], [address], [place], [place] [address]  Protime-INR     Status: None   Collection Time: 11/10/23  6:53 AM  Result Value Ref Range   Prothrombin Time 14.1 11.4 - 15.2 seconds   INR 1.0 0.8 - 1.2    Comment: (NOTE) INR goal varies based on device and disease states. Performed at [facility], [address] [place], [place] [address]    CT Angio Chest/Abd/Pel for Dissection W and/or W/WO Result Date: 11/10/2023 CLINICAL DATA:  Chest and abdominal pain EXAM: CT ANGIOGRAPHY CHEST, ABDOMEN AND PELVIS TECHNIQUE: Non-contrast CT of the chest was initially obtained. Multidetector CT imaging through the chest, abdomen and pelvis was performed using the standard protocol during bolus administration of intravenous contrast. Multiplanar reconstructed images and MIPs were obtained and reviewed to evaluate the vascular anatomy. RADIATION DOSE REDUCTION: This exam was performed according to the departmental dose-optimization program which includes automated exposure control, adjustment of the mA and/or kV according to patient size and/or use of iterative reconstruction technique. CONTRAST:  OMNIPAQUE  IOHEXOL  350 MG/ML SOLN COMPARISON:  None Available. FINDINGS: CTA CHEST FINDINGS Cardiovascular: Initial precontrast images show no acute abnormality. Post-contrast images demonstrate the thoracic aorta to have no aneurysmal dilatation or dissection. The heart is at the upper limits of normal in size. Pulmonary artery shows a normal branching pattern without intraluminal filling defect. Mediastinum/Nodes: Thoracic inlet is within normal limits. No hilar or mediastinal adenopathy is noted. The esophagus as visualized is within normal limits.  Lungs/Pleura: Lungs are well aerated bilaterally. No focal infiltrate or sizable effusion is seen. No parenchymal nodules are noted. Musculoskeletal: No chest wall abnormality. No acute or significant osseous findings. Review of the MIP images confirms the above findings. CTA ABDOMEN AND PELVIS FINDINGS VASCULAR Aorta: Abdominal aorta demonstrates no atherosclerotic calcification. No aneurysmal dilatation or dissection is seen. Celiac: Median arcuate ligament compression is noted. Mild poststenotic dilatation is seen. SMA: Patent without evidence of aneurysm, dissection, vasculitis or significant stenosis. Renals: Both renal arteries are patent without evidence of aneurysm, dissection, vasculitis, fibromuscular dysplasia or significant stenosis. Dual renal arteries are noted on the  right. IMA: Patent without evidence of aneurysm, dissection, vasculitis or significant stenosis. Inflow: Iliacs are well visualized and within normal limits. Veins: No specific venous abnormality is noted. Review of the MIP images confirms the above findings. NON-VASCULAR Hepatobiliary: No focal liver abnormality is seen. No gallstones, gallbladder wall thickening, or biliary dilatation. Pancreas: Unremarkable. No pancreatic ductal dilatation or surrounding inflammatory changes. Spleen: Normal in size without focal abnormality. Adrenals/Urinary Tract: Adrenal glands are within normal limits. Kidneys are well visualized bilaterally. No renal calculi or obstructive changes are noted. The bladder is partially distended. Stomach/Bowel: No obstructive or inflammatory changes of the colon are noted. The appendix is within normal limits. Small bowel and stomach are unremarkable. Lymphatic: No lymphadenopathy is noted. Reproductive: Uterus and bilateral adnexa are unremarkable. Other: No abdominal wall hernia or abnormality. No abdominopelvic ascites. Musculoskeletal: No acute or significant osseous findings. Review of the MIP images confirms the  above findings. IMPRESSION: CTA of the chest: No evidence of pulmonary emboli. No acute aortic abnormality is seen. No acute abnormality seen. CTA of the abdomen and pelvis: No acute aortic abnormality is noted. 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 No other focal abnormality is noted. Electronically Signed   By: Oneil Devonshire M.D.   On: 11/10/2023 03:12   US  Abdomen Limited RUQ (LIVER/GB) Result Date: 11/10/2023 CLINICAL DATA:  151471 RUQ pain 151471 EXAM: ULTRASOUND ABDOMEN LIMITED RIGHT UPPER QUADRANT COMPARISON:  None Available. FINDINGS: Gallbladder: Subcentimeter calcified gallstone within the gallbladder lumen. No gallbladder wall thickening or pericholecystic fluid visualized. No sonographic Murphy sign noted by sonographer. Common bile duct: Diameter: 2 mm Liver: No focal lesion identified. Increased parenchymal echogenicity. Portal vein is patent on color Doppler imaging with normal direction of blood flow towards the liver. Other: None. IMPRESSION: 1. Cholelithiasis with no acute cholecystitis. 2. Hepatic steatosis. Please note limited evaluation for focal hepatic masses in a patient with hepatic steatosis due to decreased penetration of the acoustic ultrasound waves. Electronically Signed   By: Morgane  Naveau M.D.   On: 11/10/2023 01:48      Assessment/Plan Epigastric and RUQ abdominal pain with elevated LFTs and cholelithiasis The patient has been seen, examined, labs, vitals, chart, and imaging personally reviewed.  They patient does not have convincing evidence of medial arcuate ligament syndrome.  Her celiac vessel does not really appears significantly narrowed.  She also doesn't have other findings such as duodenal/gastric dilatation which is often seen with this as well.  Her symptoms are much more c/w biliary disease.  Her LFTs are also significantly elevated today.  Her hepatitis panel is pending currently.  The patient does have gallstones and with her  symptoms, location of pain, and labs, we have recommended proceeding to the OR for lap chole with IOC today vs tomorrow as OR time allows.  I have discussed this with the patient and she understands and agrees with this plan.     FEN - NPO/IVFs per primary VTE - SCDs ID - Rocephin  on call to OR Admit - per medicine  I reviewed nursing notes, ED provider notes, hospitalist notes, last 24 h vitals and pain scores, last 48 h intake and output, last 24 h labs and trends, and last 24 h imaging results.  Burnard FORBES Banter, [facility] Surgery 11/10/2023, 8:37 AM Please see Amion for pager number during day hours 7:00am-4:30pm or 7:00am -11:30am on weekends

## 2023-11-10 NOTE — Progress Notes (Addendum)
  Progress Note   Patient: Anne Whitaker FMW:[id number] DOB: 02/14/1991 DOA: 11/09/2023     0 DOS: the patient was seen and examined on 11/10/2023   Brief hospital course: 33 year old woman no significant PMH presented with abdominal pain.  Admitted for further evaluation of the same.  CT was concerning for median arcuate ligament compression, evaluated by surgery, felt to be biliary colic, plans made for cholecystectomy, delayed by unrelated emergent case.  Consultants General surgery GI  Procedures/Events 9/16 admit  Assessment and Plan: Abdominal pain Cholelithiasis Probable biliary colic Transaminitis CT suggested the possibility of median arcuate ligament compression, seen by general surgery, this is thought to be insignificant.  Seen by GI.  Plan is for cholecystectomy and IOC, trend LFTs.     Subjective:  Has RUQ pain  Physical Exam: Vitals:   11/10/23 1300 11/10/23 1400 11/10/23 1453 11/10/23 1508  BP: 108/71 98/74 126/79   Pulse:  (!) 56 (!) 56   Resp:   16   Temp:   98.6 F (37 C)   TempSrc:   Oral   SpO2:  91% 97%   Weight:    82.6 kg  Height:    5' 3 (1.6 m)   Physical Exam Vitals reviewed.  Constitutional:      General: She is not in acute distress.    Appearance: She is not ill-appearing or toxic-appearing.  Cardiovascular:     Rate and Rhythm: Normal rate and regular rhythm.     Heart sounds: No murmur heard. Pulmonary:     Effort: Pulmonary effort is normal. No respiratory distress.     Breath sounds: No wheezing, rhonchi or rales.  Abdominal:     Tenderness: There is abdominal tenderness (RUQ).  Neurological:     Mental Status: She is alert.  Psychiatric:        Mood and Affect: Mood normal.        Behavior: Behavior normal.     Data Reviewed: Potassium 3.3, remainder BMP unremarkable AST up to 629, ALT up to 587, total bilirubin 1.3 Troponin negative, lactic acid within normal limits CBC unremarkable Acute hepatitis panel  negative Abdominal ultrasound and CT chest abdomen pelvis noted  Family Communication: none  Disposition: Status is: Observation   Planned Discharge Destination: Home    Time spent: 35 minutes  Author: Toribio Door, MD 11/10/2023 6:12 PM  For on call review [email or web address].

## 2023-11-10 NOTE — Consult Note (Addendum)
 Consultation  Referring Provider: Dr. Franky    Primary Care Physician:  Patient, No Pcp Per Primary Gastroenterologist: Dr. San         Reason for Consultation: Right upper quadrant pain elevated LFTs            HPI:   Anne Whitaker is a 33 y.o. female with a past medical history as listed below including reflux, who presented to the ER with a complaint of abdominal pain and elevated LFTs.    At time of presentation patient described sudden onset of severe abdominal pain in the epigastric area rating to the right upper quadrant with nausea and no vomiting, persistent and unrelated to food.  Described that over the past week she had some vague upper back discomfort like something was catching in her back and a sensation of itching all over her body 3 days ago.    Surgical team consulted the patient in regards to median arcuate ligament syndrome, per their note celiac vessel did not appear significantly narrowed.  Discussed that her symptoms were much more consistent with biliary disease with significant elevation in LFTs.  Given that she had gallstones with her symptoms recommend proceeding to the OR for lap chole with IOC today versus tomorrow pending OR time.    Today, patient describes that she has had occasional epigastric pain after eating off-and-on, but nothing like she experienced yesterday at 4 PM after eating a cheese sandwich.  Describes pain in her epigastrium that radiated around to the right side of her abdomen up into her shoulder blade.  Associated symptoms include nausea but no episodes of vomiting.  The pain has remained only helped by pain medications when she receives them.  Also describes diarrhea that started 3 days ago, watery, typically when she would eat maybe twice a day, her last bowel movement was yesterday.  She has not eaten since 4 PM yesterday.  No episodes of vomiting.  No fever, chills or upper respiratory symptoms before this.  No sick contacts.     Denies fever, chills or weight loss.  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 pylori gastritis and patient treated with quadruple therapy 02/13/2020 colonoscopy done for generalized abdominal pain pain in the left lower quadrant and hematochezia with constipation was normal with nonbleeding internal hemorrhoids  Past Medical History:  Diagnosis Date   GERD (gastroesophageal reflux disease)    Left shoulder pain     Past Surgical History:  Procedure Laterality Date   CERVICAL CERCLAGE N/A 08/06/2021   Procedure: CERCLAGE CERVICAL;  Surgeon: Lequita Evalene LABOR, MD;  Location: [facility];  Service: Gynecology;  Laterality: N/A;   COLONOSCOPY  02/13/2020   UPPER GASTROINTESTINAL ENDOSCOPY  02/13/2020    Family History  Problem Relation Age of Onset   Colon cancer Maternal Aunt    Colon cancer Paternal Aunt    Heart disease Maternal Grandmother    Hypertension Maternal Grandfather    Diabetes Maternal Grandfather    Cancer Maternal Grandfather    Brain  cancer Maternal Grandfather    Hypertension Paternal Grandmother    Diabetes Paternal Grandmother    Cancer Paternal Grandmother    Stomach cancer Paternal Grandmother    Esophageal cancer Neg Hx    Rectal cancer Neg Hx    Stroke Neg Hx     Social History   Tobacco Use   Smoking status: Never   Smokeless tobacco: Never  Vaping Use   Vaping status: Never Used  Substance Use Topics   Alcohol use: No   Drug  use: No    Prior to Admission medications   Medication Sig Start Date End Date Taking? Authorizing Provider  cholecalciferol  (VITAMIN D3) 25 MCG (1000 UNIT) tablet Take 1,000 Units by mouth daily.   Yes [provider]    Current Facility-Administered Medications  Medication Dose Route Frequency Provider Last Rate Last Admin   acetaminophen  (TYLENOL ) tablet 1,000 mg  1,000 mg Oral On Call Tammy Sor, PA-C       cefTRIAXone  (ROCEPHIN ) 2 g in sodium chloride  0.9 % 100 mL IVPB  2 g Intravenous On Call to OR Tammy Sor, PA-C       HYDROmorphone  (DILAUDID ) injection 0.5 mg  0.5 mg Intravenous Q3H PRN Franky Redia SAILOR, MD       lactated ringers  infusion   Intravenous Continuous Franky Redia SAILOR, MD 125 mL/hr at 11/10/23 0650 New Bag at 11/10/23 0650   ondansetron  (ZOFRAN ) injection 4 mg  4 mg Intravenous Q4H PRN Tammy Sor, PA-C       Current Outpatient Medications  Medication Sig Dispense Refill   cholecalciferol  (VITAMIN D3) 25 MCG (1000 UNIT) tablet Take 1,000 Units by mouth daily.      Allergies as of 11/09/2023   (No Known Allergies)     Review of Systems:    Constitutional: No weight loss, fever or chills Skin: No rash Cardiovascular: No chest pain Respiratory: No SOB Gastrointestinal: See HPI and otherwise negative Genitourinary: No dysuria Neurological: No headache, dizziness or syncope Musculoskeletal: No new muscle or joint pain Hematologic: No bleeding  Psychiatric: No history of depression or anxiety    Physical Exam:  Vital signs in last 24 hours: Temp:  [98.2 F (36.8 C)-98.5 F (36.9 C)] 98.2 F (36.8 C) (09/16 0830) Pulse Rate:  [61-78] 61 (09/16 0830) Resp:  [17-19] 18 (09/16 0830) BP: (113-127)/(68-86) 113/86 (09/16 0830) SpO2:  [94 %-100 %] 94 % (09/16 0830)   General:   Pleasant Arabic female appears to be in NAD, Well developed, Well nourished, alert and cooperative, falling asleep Head:  Normocephalic and  atraumatic. Eyes:   PEERL, EOMI. No icterus. Conjunctiva pink. Ears:  Normal auditory acuity. Neck:  Supple Throat: Oral cavity and pharynx without inflammation, swelling or lesion.  Lungs: Respirations even and unlabored. Lungs clear to auscultation bilaterally.   No wheezes, crackles, or rhonchi.  Heart: Normal S1, S2. No MRG. Regular rate and rhythm. No peripheral edema, cyanosis or pallor.  Abdomen:  Soft, nondistended, moderate to marked epigastric and right upper quadrant TTP. No rebound or guarding. Normal bowel sounds. No appreciable masses or hepatomegaly. Rectal:  Not performed.  Msk:  Symmetrical without gross deformities. Peripheral pulses intact.  Extremities:  Without edema, no deformity or joint abnormality.  Neurologic:  Alert and  oriented x4;  grossly normal neurologically. Skin:   Dry and intact without significant lesions or rashes. Psychiatric: Demonstrates good judgement and reason without abnormal affect or behaviors.   LAB RESULTS: Recent Labs    11/09/23 2221 11/10/23  0556  WBC 8.3 6.3  HGB 12.4 11.1*  HCT 37.6 33.4*  PLT 266 220   BMET Recent Labs    11/09/23 2221 11/10/23 0556  NA 139 143  K 3.9 3.3*  CL 101 111  CO2 27 22  GLUCOSE 103* 94  BUN 7 6  CREATININE 0.52 0.48  CALCIUM  10.0 7.5*      Latest Ref Rng & Units 11/10/2023    5:56 AM 11/10/2023    4:29 AM 11/09/2023   10:21 PM  Hepatic Function  Total Protein 6.5 - 8.1 g/dL 5.3  5.8  7.1   Albumin 3.5 - 5.0 g/dL 3.5  3.8  4.6   AST 15 - 41 U/L 629  601  261   ALT 0 - 44 U/L 587  531  185   Alk Phosphatase 38 - 126 U/L 110  116  117   Total Bilirubin 0.0 - 1.2 mg/dL 1.3  1.2  0.9   Bilirubin, Direct 0.0 - 0.2 mg/dL 0.9  0.9       PT/INR Recent Labs    11/10/23 0653  LABPROT 14.1  INR 1.0    STUDIES: CT Angio Chest/Abd/Pel for Dissection W and/or W/WO Result Date: 11/10/2023 CLINICAL DATA:  Chest and abdominal pain EXAM: CT ANGIOGRAPHY CHEST, ABDOMEN AND PELVIS TECHNIQUE:  Non-contrast CT of the chest was initially obtained. Multidetector CT imaging through the chest, abdomen and pelvis was performed using the standard protocol during bolus administration of intravenous contrast. Multiplanar reconstructed images and MIPs were obtained and reviewed to evaluate the vascular anatomy. RADIATION DOSE REDUCTION: This exam was performed according to the departmental dose-optimization program which includes automated exposure control, adjustment of the mA and/or kV according to patient size and/or use of iterative reconstruction technique. CONTRAST:  OMNIPAQUE  IOHEXOL  350 MG/ML SOLN COMPARISON:  None Available. FINDINGS: CTA CHEST FINDINGS Cardiovascular: Initial precontrast images show no acute abnormality. Post-contrast images demonstrate the thoracic aorta to have no aneurysmal dilatation or dissection. The heart is at the upper limits of normal in size. Pulmonary artery shows a normal branching pattern without intraluminal filling defect. Mediastinum/Nodes: Thoracic inlet is within normal limits. No hilar or mediastinal adenopathy is noted. The esophagus as visualized is within normal limits. Lungs/Pleura: Lungs are well aerated bilaterally. No focal infiltrate or sizable effusion is seen. No parenchymal nodules are noted. Musculoskeletal: No chest wall abnormality. No acute or significant osseous findings. Review of the MIP images confirms the above findings. CTA ABDOMEN AND PELVIS FINDINGS VASCULAR Aorta: Abdominal aorta demonstrates no atherosclerotic calcification. No aneurysmal dilatation or dissection is seen. Celiac: Median arcuate ligament compression is noted. Mild poststenotic dilatation is seen. SMA: Patent without evidence of aneurysm, dissection, vasculitis or significant stenosis. Renals: Both renal arteries are patent without evidence of aneurysm, dissection, vasculitis, fibromuscular dysplasia or significant stenosis. Dual renal arteries are noted on the right. IMA:  Patent without evidence of aneurysm, dissection, vasculitis or significant stenosis. Inflow: Iliacs are well visualized and within normal limits. Veins: No specific venous abnormality is noted. Review of the MIP images confirms the above findings. NON-VASCULAR Hepatobiliary: No focal liver abnormality is seen. No gallstones, gallbladder wall thickening, or biliary dilatation. Pancreas: Unremarkable. No pancreatic ductal dilatation or surrounding inflammatory changes. Spleen: Normal in size without focal abnormality. Adrenals/Urinary Tract: Adrenal glands are within normal limits. Kidneys are well visualized bilaterally. No renal calculi or obstructive changes are noted. The bladder is partially distended. Stomach/Bowel: No obstructive or inflammatory changes of the colon are noted. The  appendix is within normal limits. Small bowel and stomach are unremarkable. Lymphatic: No lymphadenopathy is noted. Reproductive: Uterus and bilateral adnexa are unremarkable. Other: No abdominal wall hernia or abnormality. No abdominopelvic ascites. Musculoskeletal: No acute or significant osseous findings. Review of the MIP images confirms the above findings. IMPRESSION: CTA of the chest: No evidence of pulmonary emboli. No acute aortic abnormality is seen. No acute abnormality seen. CTA of the abdomen and pelvis: No acute aortic abnormality is noted. 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 No other focal abnormality is noted. Electronically Signed   By: Oneil Devonshire M.D.   On: 11/10/2023 03:12   US  Abdomen Limited RUQ (LIVER/GB) Result Date: 11/10/2023 CLINICAL DATA:  151471 RUQ pain 151471 EXAM: ULTRASOUND ABDOMEN LIMITED RIGHT UPPER QUADRANT COMPARISON:  None Available. FINDINGS: Gallbladder: Subcentimeter calcified gallstone within the gallbladder lumen. No gallbladder wall thickening or pericholecystic fluid visualized. No sonographic Murphy sign noted by sonographer. Common bile duct: Diameter:  2 mm Liver: No focal lesion identified. Increased parenchymal echogenicity. Portal vein is patent on color Doppler imaging with normal direction of blood flow towards the liver. Other: None. IMPRESSION: 1. Cholelithiasis with no acute cholecystitis. 2. Hepatic steatosis. Please note limited evaluation for focal hepatic masses in a patient with hepatic steatosis due to decreased penetration of the acoustic ultrasound waves. Electronically Signed   By: Morgane  Naveau M.D.   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  Impression: 1.  Right upper quadrant/epigastric abdominal pain: Started acutely at 4 PM on 11/09/2023 radiating around to the right upper quadrant and into her right shoulder blade, associated with nausea, proceeded by watery diarrhea over the past 2 to 3 days per patient, no fever or chills patient not on any medications outpatient and currently not describing those symptoms, elevated LFTs as above (AST 601 --> 629, ALT 531 -> 587, alk phos 116--> 110, total bili 1.2--> 1.3), acute hepatitis panel negative, imaging with cholelithiasis without acute cholecystitis, CT with question of median arcuate ligament compression (surgical team does not think this is contributing); most likely gallbladder etiology +/- other liver etiology +/- PUD versus gastritis 2.  Elevated LFTs: With above 3.  Abnormal UA: Patient with no symptoms of UTI, currently on menstrual cycle, urine culture pending 4.  Normocytic normochromic anemia: Hemoglobin 12.4--> 11.1  Plan: 1.  Plan per surgical team to take the patient to the OR either today or tomorrow for a lap cholecystectomy with IOC 2.   Continue to trend LFTs, pending results in the coming days may need to complete a larger liver workup with other serologies 3.  Started the patient on Pantoprazole  40 mg twice daily to cover for possible PUD 4.  Ordered GI cocktail every 6 hours as needed for epigastric pain 5.  Patient n.p.o. now per surgical team, diet per them 6.  Will await results after upcoming cholecystectomy  Thank you for your kind consultation, we will continue to follow along.  Delon Gibson [facility]  11/10/2023, 8:54 AM      Attending Physician's Attestation   I have taken an interval history, reviewed the chart and examined the patient.   This  is a patient that the GI service is asked to evaluate in the setting of acute onset abdominal pain with associated nausea and abnormal LFTs.  She has had infrequent episodes of abdominal pain postprandially in the past but things progressed significantly yesterday after eating.  She came in for further evaluation and was found to have significant elevation in her LFT pattern and an ultrasound showing evidence of cholelithiasis without biliary duct dilation.  Cross-sectional imaging was performed that did not show biliary duct dilation (I have reviewed this formally myself) but there was some concern for possible medial arcuate ligament compression via the celiac axis.  Surgery was consulted and have seen the patient with lesser concern for MALS but with more concern for potential symptomatic cholelithiasis.  Her acute hepatitis panel has returned showing no evidence of a an acute HAV/HBV/HCV, though other viruses could also cause this pattern.  Obstructive choledocholithiasis can be kept in mind, but suspect that we would have seen more significant biliary duct dilation with the amount of time between her pain onset and her imaging studies but sometimes we do not always see that immediately in the acute setting.  We should continue to monitor her liver biochemical testing.  Agree  that it sounds like she has some potential for symptomatic cholelithiasis so cholecystectomy with IOC if possible (which is planned currently by our surgeons) makes the most sense.  She may be going for that later today versus tomorrow.  Upper endoscopy can be considered for this patient if issues persist to rule out PUD.  We will be on the sideline waiting for results of cholecystectomy while monitoring the liver biochemical testing.  Additional serologic workup can be considered in future.  If the liver test continue to increase then potential MRI/MRCP will need to be considered as well.  If IOC is positive, we will be happy to proceed with ERCP if necessary.  Will hold on endoscopy planning for now due to plan for upcoming cholecystectomy.  If this is MALS, 1 would expect more of a chronic picture standpoint rather than this acute onset.  Time will tell.   I agree with the Advanced Practitioner's note, impression, and recommendations with updates and my documentation as noted above.  The majority of the medical decision making/process, formulation of the impression/plan of action for the patient were performed by me with substantive portion of this encounter (>50% time spent including complete performance of at least one of the key components of MDM, History, and/or Exam).   Aloha Finner, MD [facility] Office # [phone]

## 2023-11-11 ENCOUNTER — Observation Stay (HOSPITAL_COMMUNITY): Admitting: Anesthesiology

## 2023-11-11 ENCOUNTER — Encounter (HOSPITAL_COMMUNITY)
Admission: EM | Disposition: A | Discharge status: Home / Self Care | Payer: Self-pay | Source: Home / Self Care | Attending: Emergency Medicine

## 2023-11-11 ENCOUNTER — Other Ambulatory Visit (HOSPITAL_COMMUNITY): Payer: Self-pay

## 2023-11-11 ENCOUNTER — Observation Stay (HOSPITAL_COMMUNITY)

## 2023-11-11 ENCOUNTER — Encounter (HOSPITAL_COMMUNITY): Payer: Self-pay | Admitting: Internal Medicine

## 2023-11-11 DIAGNOSIS — R748 Abnormal levels of other serum enzymes: Secondary | ICD-10-CM | POA: Diagnosis not present

## 2023-11-11 DIAGNOSIS — K8012 Calculus of gallbladder with acute and chronic cholecystitis without obstruction: Secondary | ICD-10-CM

## 2023-11-11 HISTORY — PX: LAPAROSCOPIC CHOLECYSTECTOMY SINGLE PORT: SHX5891

## 2023-11-11 LAB — CBC
HCT: 34.1 % — ABNORMAL LOW (ref 36.0–46.0)
Hemoglobin: 11.4 g/dL — ABNORMAL LOW (ref 12.0–15.0)
MCH: 30.2 pg (ref 26.0–34.0)
MCHC: 33.4 g/dL (ref 30.0–36.0)
MCV: 90.2 fL (ref 80.0–100.0)
Platelets: 242 K/uL (ref 150–400)
RBC: 3.78 MIL/uL — ABNORMAL LOW (ref 3.87–5.11)
RDW: 13 % (ref 11.5–15.5)
WBC: 6 K/uL (ref 4.0–10.5)
nRBC: 0 % (ref 0.0–0.2)

## 2023-11-11 LAB — COMPREHENSIVE METABOLIC PANEL WITH GFR
ALT: 689 U/L — ABNORMAL HIGH (ref 0–44)
AST: 293 U/L — ABNORMAL HIGH (ref 15–41)
Albumin: 3.7 g/dL (ref 3.5–5.0)
Alkaline Phosphatase: 153 U/L — ABNORMAL HIGH (ref 38–126)
Anion gap: 11 (ref 5–15)
BUN: 6 mg/dL (ref 6–20)
CO2: 24 mmol/L (ref 22–32)
Calcium: 8.7 mg/dL — ABNORMAL LOW (ref 8.9–10.3)
Chloride: 106 mmol/L (ref 98–111)
Creatinine, Ser: 0.56 mg/dL (ref 0.44–1.00)
GFR, Estimated: >60 mL/min (ref >60)
Glucose, Bld: 100 mg/dL — ABNORMAL HIGH (ref 70–99)
Potassium: 4 mmol/L (ref 3.5–5.1)
Sodium: 140 mmol/L (ref 135–145)
Total Bilirubin: 0.8 mg/dL (ref 0.0–1.2)
Total Protein: 5.8 g/dL — ABNORMAL LOW (ref 6.5–8.1)

## 2023-11-11 LAB — URINE CULTURE

## 2023-11-11 SURGERY — LAPAROSCOPIC CHOLECYSTECTOMY SINGLE SITE
Anesthesia: General

## 2023-11-11 MED ORDER — SIMETHICONE 40 MG/0.6ML PO SUSP
80.0000 mg | Freq: Four times a day (QID) | ORAL | Status: DC | PRN
  Administered 2023-11-12: 80 mg via ORAL
  Filled 2023-11-11: qty 1.2

## 2023-11-11 MED ORDER — DIPHENHYDRAMINE HCL 25 MG PO CAPS: 25.0000 mg | ORAL_CAPSULE | Freq: Four times a day (QID) | ORAL | Status: DC | PRN

## 2023-11-11 MED ORDER — LACTATED RINGERS IV SOLN: INTRAVENOUS | Status: DC | PRN

## 2023-11-11 MED ORDER — OXYCODONE HCL 5 MG PO TABS
ORAL_TABLET | ORAL | Status: AC
  Filled 2023-11-11: qty 1

## 2023-11-11 MED ORDER — SODIUM CHLORIDE 0.9% FLUSH
3.0000 mL | Freq: Two times a day (BID) | INTRAVENOUS | Status: DC
Start: 2023-11-11 — End: 2023-11-13
  Administered 2023-11-11 – 2023-11-13 (×4): 3 mL via INTRAVENOUS

## 2023-11-11 MED ORDER — KETOROLAC TROMETHAMINE 30 MG/ML IJ SOLN: 30.0000 mg | Freq: Once | INTRAMUSCULAR | Status: DC | PRN

## 2023-11-11 MED ORDER — STERILE WATER FOR IRRIGATION IR SOLN
Status: DC | PRN
  Administered 2023-11-11: 1000 mL

## 2023-11-11 MED ORDER — BUPIVACAINE-EPINEPHRINE (PF) 0.25% -1:200000 IJ SOLN
INTRAMUSCULAR | Status: AC
Start: 2023-11-11 — End: 2023-11-11
  Filled 2023-11-11: qty 30

## 2023-11-11 MED ORDER — PROPOFOL 10 MG/ML IV BOLUS
INTRAVENOUS | Status: AC
Start: 2023-11-11 — End: 2023-11-11
  Filled 2023-11-11: qty 20

## 2023-11-11 MED ORDER — MENTHOL 3 MG MT LOZG: 1.0000 | LOZENGE | OROMUCOSAL | Status: DC | PRN

## 2023-11-11 MED ORDER — PROPOFOL 10 MG/ML IV BOLUS
INTRAVENOUS | Status: DC | PRN
  Administered 2023-11-11: 200 mg via INTRAVENOUS

## 2023-11-11 MED ORDER — HYDROMORPHONE HCL 1 MG/ML IJ SOLN
INTRAMUSCULAR | Status: AC
  Filled 2023-11-11: qty 1

## 2023-11-11 MED ORDER — MEPERIDINE HCL 100 MG/ML IJ SOLN
INTRAMUSCULAR | Status: AC
  Filled 2023-11-11: qty 1

## 2023-11-11 MED ORDER — SODIUM CHLORIDE 0.9 % IR SOLN
Status: DC | PRN
  Administered 2023-11-11: 1000 mL

## 2023-11-11 MED ORDER — SODIUM CHLORIDE 0.9 % IV SOLN
INTRAVENOUS | Status: AC
  Filled 2023-11-11: qty 20

## 2023-11-11 MED ORDER — ROCURONIUM BROMIDE 10 MG/ML (PF) SYRINGE
PREFILLED_SYRINGE | INTRAVENOUS | Status: DC | PRN
  Administered 2023-11-11: 50 mg via INTRAVENOUS
  Administered 2023-11-11: 10 mg via INTRAVENOUS

## 2023-11-11 MED ORDER — ONDANSETRON HCL 4 MG/2ML IJ SOLN
INTRAMUSCULAR | Status: AC
  Filled 2023-11-11: qty 2

## 2023-11-11 MED ORDER — ONDANSETRON HCL 4 MG/2ML IJ SOLN
INTRAMUSCULAR | Status: DC | PRN
  Administered 2023-11-11: 4 mg via INTRAVENOUS

## 2023-11-11 MED ORDER — DEXTROSE 5 % IV SOLN
INTRAVENOUS | Status: DC | PRN
  Administered 2023-11-11: 2 g via INTRAVENOUS

## 2023-11-11 MED ORDER — MAGIC MOUTHWASH: 15.0000 mL | Freq: Four times a day (QID) | ORAL | Status: DC | PRN

## 2023-11-11 MED ORDER — METHOCARBAMOL 1000 MG/10ML IJ SOLN: 1000.0000 mg | Freq: Four times a day (QID) | INTRAMUSCULAR | Status: DC | PRN

## 2023-11-11 MED ORDER — OXYCODONE-ACETAMINOPHEN 5-325 MG PO TABS
1.0000 | ORAL_TABLET | Freq: Four times a day (QID) | ORAL | 0 refills | Status: AC | PRN
  Filled 2023-11-11: qty 20, 5d supply, fill #0

## 2023-11-11 MED ORDER — AMISULPRIDE (ANTIEMETIC) 5 MG/2ML IV SOLN
INTRAVENOUS | Status: AC
  Filled 2023-11-11: qty 4

## 2023-11-11 MED ORDER — IOPAMIDOL (ISOVUE-300) INJECTION 61%: INTRAVENOUS | Status: DC | PRN

## 2023-11-11 MED ORDER — PROPOFOL 10 MG/ML IV BOLUS
INTRAVENOUS | Status: AC
  Filled 2023-11-11: qty 20

## 2023-11-11 MED ORDER — LACTATED RINGERS IV SOLN: INTRAVENOUS | Status: DC

## 2023-11-11 MED ORDER — CHLORHEXIDINE GLUCONATE 0.12 % MT SOLN
15.0000 mL | Freq: Once | OROMUCOSAL | Status: AC
  Administered 2023-11-11: 15 mL via OROMUCOSAL

## 2023-11-11 MED ORDER — NAPROXEN 500 MG PO TABS
500.0000 mg | ORAL_TABLET | Freq: Two times a day (BID) | ORAL | 1 refills | Status: DC | PRN
  Filled 2023-11-11: qty 30, 15d supply, fill #0

## 2023-11-11 MED ORDER — OXYCODONE HCL 5 MG/5ML PO SOLN: 5.0000 mg | Freq: Once | ORAL | Status: AC | PRN

## 2023-11-11 MED ORDER — DEXAMETHASONE SODIUM PHOSPHATE 10 MG/ML IJ SOLN
INTRAMUSCULAR | Status: AC
  Filled 2023-11-11: qty 1

## 2023-11-11 MED ORDER — AMISULPRIDE (ANTIEMETIC) 5 MG/2ML IV SOLN
10.0000 mg | Freq: Once | INTRAVENOUS | Status: AC | PRN
  Administered 2023-11-11: 10 mg via INTRAVENOUS

## 2023-11-11 MED ORDER — DEXAMETHASONE SODIUM PHOSPHATE 10 MG/ML IJ SOLN
INTRAMUSCULAR | Status: DC | PRN
  Administered 2023-11-11: 5 mg via INTRAVENOUS

## 2023-11-11 MED ORDER — ONDANSETRON HCL 4 MG/2ML IJ SOLN
4.0000 mg | Freq: Once | INTRAMUSCULAR | Status: AC | PRN
  Administered 2023-11-11: 4 mg via INTRAVENOUS

## 2023-11-11 MED ORDER — MIDAZOLAM HCL 2 MG/2ML IJ SOLN
INTRAMUSCULAR | Status: DC | PRN
  Administered 2023-11-11: 2 mg via INTRAVENOUS

## 2023-11-11 MED ORDER — SCOPOLAMINE 1 MG/3DAYS TD PT72: 1.0000 | MEDICATED_PATCH | Freq: Once | TRANSDERMAL | Status: DC

## 2023-11-11 MED ORDER — SODIUM CHLORIDE 0.9% FLUSH: 3.0000 mL | INTRAVENOUS | Status: DC | PRN

## 2023-11-11 MED ORDER — ONDANSETRON HCL 4 MG PO TABS
4.0000 mg | ORAL_TABLET | Freq: Three times a day (TID) | ORAL | 5 refills | Status: AC | PRN
  Filled 2023-11-11: qty 8, 3d supply, fill #0

## 2023-11-11 MED ORDER — SUGAMMADEX SODIUM 200 MG/2ML IV SOLN
INTRAVENOUS | Status: DC | PRN
  Administered 2023-11-11: 200 mg via INTRAVENOUS

## 2023-11-11 MED ORDER — FENTANYL CITRATE (PF) 250 MCG/5ML IJ SOLN
INTRAMUSCULAR | Status: DC | PRN
  Administered 2023-11-11 (×3): 50 ug via INTRAVENOUS

## 2023-11-11 MED ORDER — SALINE SPRAY 0.65 % NA SOLN: 1.0000 | Freq: Four times a day (QID) | NASAL | Status: DC | PRN

## 2023-11-11 MED ORDER — PHENOL 1.4 % MT LIQD: 2.0000 | OROMUCOSAL | Status: DC | PRN

## 2023-11-11 MED ORDER — FENTANYL CITRATE (PF) 250 MCG/5ML IJ SOLN
INTRAMUSCULAR | Status: AC
  Filled 2023-11-11: qty 5

## 2023-11-11 MED ORDER — SODIUM CHLORIDE 0.9 % IV SOLN
250.0000 mL | INTRAVENOUS | Status: DC | PRN
Start: 2023-11-11 — End: 2023-11-13

## 2023-11-11 MED ORDER — HYDROMORPHONE HCL 1 MG/ML IJ SOLN
0.2500 mg | INTRAMUSCULAR | Status: DC | PRN
  Administered 2023-11-11 (×2): 0.5 mg via INTRAVENOUS

## 2023-11-11 MED ORDER — BISACODYL 10 MG RE SUPP: 10.0000 mg | Freq: Two times a day (BID) | RECTAL | Status: DC | PRN

## 2023-11-11 MED ORDER — POLYETHYLENE GLYCOL 3350 17 G PO PACK
17.0000 g | PACK | Freq: Every day | ORAL | Status: DC
  Administered 2023-11-12: 17 g via ORAL
  Filled 2023-11-11: qty 1

## 2023-11-11 MED ORDER — VITAMIN D 25 MCG (1000 UNIT) PO TABS
1000.0000 [IU] | ORAL_TABLET | Freq: Every day | ORAL | Status: DC
  Administered 2023-11-12: 1000 [IU] via ORAL
  Filled 2023-11-11: qty 1

## 2023-11-11 MED ORDER — MEPERIDINE HCL 100 MG/ML IJ SOLN
6.2500 mg | INTRAMUSCULAR | Status: DC | PRN
  Administered 2023-11-11: 12.5 mg via INTRAVENOUS

## 2023-11-11 MED ORDER — ACETAMINOPHEN 500 MG PO TABS
1000.0000 mg | ORAL_TABLET | Freq: Four times a day (QID) | ORAL | Status: DC
Start: 2023-11-11 — End: 2023-11-12
  Administered 2023-11-11 – 2023-11-12 (×3): 1000 mg via ORAL
  Filled 2023-11-11 (×3): qty 2

## 2023-11-11 MED ORDER — KETOROLAC TROMETHAMINE 30 MG/ML IJ SOLN
INTRAMUSCULAR | Status: AC
  Filled 2023-11-11: qty 1

## 2023-11-11 MED ORDER — LIDOCAINE 2% (20 MG/ML) 5 ML SYRINGE
INTRAMUSCULAR | Status: DC | PRN
  Administered 2023-11-11: 80 mg via INTRAVENOUS

## 2023-11-11 MED ORDER — CALCIUM POLYCARBOPHIL 625 MG PO TABS
625.0000 mg | ORAL_TABLET | Freq: Two times a day (BID) | ORAL | Status: DC
Start: 2023-11-11 — End: 2023-11-13
  Administered 2023-11-11 – 2023-11-12 (×3): 625 mg via ORAL
  Filled 2023-11-11 (×3): qty 1

## 2023-11-11 MED ORDER — METHOCARBAMOL 500 MG PO TABS
1000.0000 mg | ORAL_TABLET | Freq: Four times a day (QID) | ORAL | Status: DC | PRN
  Administered 2023-11-11 – 2023-11-12 (×2): 1000 mg via ORAL
  Filled 2023-11-11 (×2): qty 2

## 2023-11-11 MED ORDER — LACTATED RINGERS IR SOLN
Status: DC | PRN
  Administered 2023-11-11: 1000 mL

## 2023-11-11 MED ORDER — MIDAZOLAM HCL 2 MG/2ML IJ SOLN
INTRAMUSCULAR | Status: AC
  Filled 2023-11-11: qty 2

## 2023-11-11 MED ORDER — LACTATED RINGERS IV BOLUS
1000.0000 mL | Freq: Three times a day (TID) | INTRAVENOUS | Status: AC | PRN
Start: 2023-11-11 — End: 2023-11-13

## 2023-11-11 MED ORDER — DIPHENHYDRAMINE HCL 50 MG/ML IJ SOLN: 12.5000 mg | Freq: Four times a day (QID) | INTRAMUSCULAR | Status: DC | PRN

## 2023-11-11 MED ORDER — OXYCODONE HCL 5 MG PO TABS
5.0000 mg | ORAL_TABLET | Freq: Once | ORAL | Status: AC | PRN
  Administered 2023-11-11: 5 mg via ORAL

## 2023-11-11 MED ORDER — NAPHAZOLINE-GLYCERIN 0.012-0.25 % OP SOLN
1.0000 [drp] | Freq: Four times a day (QID) | OPHTHALMIC | Status: DC | PRN
Start: 2023-11-11 — End: 2023-11-13

## 2023-11-11 MED ORDER — HYDROMORPHONE HCL 1 MG/ML IJ SOLN
0.5000 mg | INTRAMUSCULAR | Status: DC | PRN
  Administered 2023-11-11 – 2023-11-12 (×2): 1 mg via INTRAVENOUS
  Filled 2023-11-11 (×2): qty 1

## 2023-11-11 MED ORDER — KETOROLAC TROMETHAMINE 30 MG/ML IJ SOLN
INTRAMUSCULAR | Status: DC | PRN
  Administered 2023-11-11: 30 mg via INTRAVENOUS

## 2023-11-11 MED ORDER — BUPIVACAINE-EPINEPHRINE 0.25% -1:200000 IJ SOLN
INTRAMUSCULAR | Status: DC | PRN
  Administered 2023-11-11: 30 mL

## 2023-11-11 SURGICAL SUPPLY — 41 items
BAG COUNTER SPONGE SURGICOUNT (BAG) ×1 IMPLANT
CABLE HIGH FREQUENCY MONO STRZ (ELECTRODE) IMPLANT
CLIP APPLIE 5 13 M/L LIGAMAX5 (MISCELLANEOUS) IMPLANT
CLIP APPLIE ROT 10 11.4 M/L (STAPLE) IMPLANT
COVER MAYO STAND XLG (MISCELLANEOUS) ×1 IMPLANT
COVER SURGICAL LIGHT HANDLE (MISCELLANEOUS) ×1 IMPLANT
DRAPE 3/4 80X56 (DRAPES) IMPLANT
DRAPE C-ARM 42X120 X-RAY (DRAPES) ×1 IMPLANT
DRAPE UTILITY XL STRL (DRAPES) ×1 IMPLANT
DRAPE WARM FLUID 44X44 (DRAPES) ×1 IMPLANT
DRSG TEGADERM 2-3/8X2-3/4 SM (GAUZE/BANDAGES/DRESSINGS) ×2 IMPLANT
DRSG TEGADERM 6X8 (GAUZE/BANDAGES/DRESSINGS) ×1 IMPLANT
DRSG TEGADERM 8X12 (GAUZE/BANDAGES/DRESSINGS) IMPLANT
ELECT REM PT RETURN 15FT ADLT (MISCELLANEOUS) ×1 IMPLANT
ENDOLOOP SUT PDS II 0 18 (SUTURE) IMPLANT
GAUZE SPONGE 2X2 8PLY STRL LF (GAUZE/BANDAGES/DRESSINGS) IMPLANT
GLOVE ECLIPSE 8.0 STRL XLNG CF (GLOVE) ×1 IMPLANT
GLOVE INDICATOR 8.0 STRL GRN (GLOVE) ×1 IMPLANT
GOWN STRL REUS W/ TWL XL LVL3 (GOWN DISPOSABLE) ×1 IMPLANT
IRRIGATION SUCT STRKRFLW 2 WTP (MISCELLANEOUS) ×1 IMPLANT
KIT BASIN OR (CUSTOM PROCEDURE TRAY) ×1 IMPLANT
KIT TURNOVER KIT A (KITS) ×1 IMPLANT
NDL BIOPSY 14X6 SOFT TISS (NEEDLE) IMPLANT
NEEDLE BIOPSY 14X6 SOFT TISS (NEEDLE) ×1 IMPLANT
PAD TELFA 2X3 NADH STRL (GAUZE/BANDAGES/DRESSINGS) IMPLANT
PENCIL SMOKE EVACUATOR (MISCELLANEOUS) IMPLANT
POUCH RETRIEVAL ECOSAC 10 (ENDOMECHANICALS) ×1 IMPLANT
SCISSORS LAP 5X35 DISP (ENDOMECHANICALS) ×1 IMPLANT
SET CHOLANGIOGRAPH MIX (MISCELLANEOUS) ×1 IMPLANT
SET TUBE SMOKE EVAC HIGH FLOW (TUBING) ×1 IMPLANT
SLEEVE ADV FIXATION 5X100MM (TROCAR) ×1 IMPLANT
SPIKE FLUID TRANSFER (MISCELLANEOUS) ×1 IMPLANT
SUT MNCRL AB 4-0 PS2 18 (SUTURE) ×1 IMPLANT
SUT PDS AB 1 CT 36 (SUTURE) ×1 IMPLANT
SUT VICRYL 0 UR6 27IN ABS (SUTURE) ×1 IMPLANT
SYR 20ML LL LF (SYRINGE) ×1 IMPLANT
TOWEL OR 17X26 10 PK STRL BLUE (TOWEL DISPOSABLE) ×1 IMPLANT
TRAY LAPAROSCOPIC (CUSTOM PROCEDURE TRAY) ×1 IMPLANT
TROCAR ADV FIXATION 12X100MM (TROCAR) ×1 IMPLANT
TROCAR ADV FIXATION 5X100MM (TROCAR) ×1 IMPLANT
TROCAR Z-THREAD OPTICAL 5X100M (TROCAR) ×1 IMPLANT

## 2023-11-11 NOTE — Anesthesia Postprocedure Evaluation (Signed)
 Anesthesia Post Note  Patient: Anne Whitaker  Procedure(s) Performed: LAPAROSCOPIC CHOLECYSTECTOMY SINGLE SITE     Patient location during evaluation: PACU Anesthesia Type: General Level of consciousness: awake and alert Pain management: pain level controlled Vital Signs Assessment: post-procedure vital signs reviewed and stable Respiratory status: spontaneous breathing, nonlabored ventilation, respiratory function stable and patient connected to nasal cannula oxygen Cardiovascular status: blood pressure returned to baseline and stable Postop Assessment: no apparent nausea or vomiting Anesthetic complications: no   No notable events documented.  Last Vitals:  Vitals:   11/11/23 1345 11/11/23 1412  BP: 110/73 121/80  Pulse: (!) 55 (!) 59  Resp: 11 16  Temp:  36.7 C  SpO2: 99% 99%    Last Pain:  Vitals:   11/11/23 1412  TempSrc: Oral  PainSc:                  Garnette DELENA Gab

## 2023-11-11 NOTE — Interval H&P Note (Signed)
 History and Physical Interval Note:  11/11/2023 8:26 AM  Anne Whitaker  has presented today for surgery, with the diagnosis of GALLSTONES.  The various methods of treatment have been discussed with the patient and family. After consideration of risks, benefits and other options for treatment, the patient has consented to  Procedure(s): LAPAROSCOPIC CHOLECYSTECTOMY WITH INTRAOPERATIVE CHOLANGIOGRAM (N/A) as a surgical intervention.  The patient's history has been reviewed, patient examined, no change in status, stable for surgery.  I have reviewed the patient's chart and labs.  Questions were answered to the patient's satisfaction.    The anatomy & physiology of hepatobiliary & pancreatic function was discussed.  The pathophysiology of gallbladder dysfunction was discussed.  Natural history risks without surgery was discussed.   I feel the risks of no intervention will lead to serious problems that outweigh the operative risks; therefore, I recommended cholecystectomy to remove the pathology.  I explained laparoscopic techniques with possible need for an open approach.  Probable cholangiogram to evaluate the bilary tract was explained as well.    Risks such as bleeding, infection, diarrhea and other bowel changes, abscess, leak, injury to other organs, need for repair of tissues / organs, need for further treatment, stroke, heart attack, death, and other risks were discussed.  I noted a good likelihood this will help address the problem, but there is a chance it may not help.  Possibility that this will not correct all abdominal symptoms was explained.  Goals of post-operative recovery were discussed as well.  We will work to minimize complications.  An educational handout further explaining the pathology and treatment options was given as well.  Questions were answered.  The patient expresses understanding & wishes to proceed with surgery.  I have re-reviewed the the patient's records, history,  medications, and allergies.  I have re-examined the patient.  I again discussed intraoperative plans and goals of post-operative recovery.  The patient agrees to proceed.  Anne Whitaker  10-26-1990 [id number]  Patient Care Team: Patient, No Pcp Per as PCP - General (General Practice) Lequita Evalene LABOR, MD as PCP - OBGYN (Obstetrics and Gynecology)  Patient Active Problem List   Diagnosis Date Noted   Abdominal pain 11/10/2023   Elevated LFTs 11/10/2023   Anemia 11/10/2023   Biliary colic 11/10/2023   Transaminitis 11/10/2023   Cholelithiasis 11/10/2023   Pregnancy 12/16/2021   Cervical incompetence 08/20/2021   Cervical incompetence during pregnancy in second trimester 05/25/2017    Past Medical History:  Diagnosis Date   GERD (gastroesophageal reflux disease)    Left shoulder pain     Past Surgical History:  Procedure Laterality Date   CERVICAL CERCLAGE N/A 08/06/2021   Procedure: CERCLAGE CERVICAL;  Surgeon: Lequita Evalene LABOR, MD;  Location: [facility];  Service: Gynecology;  Laterality: N/A;   COLONOSCOPY  02/13/2020   UPPER GASTROINTESTINAL ENDOSCOPY  02/13/2020    Social History   Socioeconomic History   Marital status: Married    Spouse name: Not on file   Number of children: 0   Years of education: Not on file   Highest education level: Not on file  Occupational History   Not on file  Tobacco Use   Smoking status: Never    Passive exposure: Current   Smokeless tobacco: Never  Vaping Use   Vaping status: Never Used  Substance and Sexual Activity   Alcohol use: No   Drug use: No   Sexual activity: Not Currently  Other Topics Concern  Not on file  Social History Narrative   Not on file   Social Drivers of Health   Financial Resource Strain: Not on file  Food Insecurity: No Food Insecurity (11/10/2023)   Hunger Vital Sign    Worried About Running Out of Food in the Last Year: Never true    Ran Out of Food in the Last Year: Never true   Transportation Needs: No Transportation Needs (11/10/2023)   PRAPARE - Administrator, Civil Service (Medical): No    Lack of Transportation (Non-Medical): No  Physical Activity: Not on file  Stress: Not on file  Social Connections: Not on file  Intimate Partner Violence: Not At Risk (11/10/2023)   Humiliation, Afraid, Rape, and Kick questionnaire    Fear of Current or Ex-Partner: No    Emotionally Abused: No    Physically Abused: No    Sexually Abused: No    Family History  Problem Relation Age of Onset   Colon cancer Maternal Aunt    Colon cancer Paternal Aunt    Heart disease Maternal Grandmother    Hypertension Maternal Grandfather    Diabetes Maternal Grandfather    Cancer Maternal Grandfather    Brain cancer Maternal Grandfather    Hypertension Paternal Grandmother    Diabetes Paternal Grandmother    Cancer Paternal Grandmother    Stomach cancer Paternal Grandmother    Esophageal cancer Neg Hx    Rectal cancer Neg Hx    Stroke Neg Hx     Medications Prior to Admission  Medication Sig Dispense Refill Last Dose/Taking   cholecalciferol  (VITAMIN D3) 25 MCG (1000 UNIT) tablet Take 1,000 Units by mouth daily.   Past Week    Current Facility-Administered Medications  Medication Dose Route Frequency Provider Last Rate Last Admin   [MAR Hold] alum & mag hydroxide-simeth (MAALOX/MYLANTA) 200-200-20 MG/5ML suspension 30 mL  30 mL Oral Q6H PRN Beather Delon Gibson, PA       chlorhexidine  (PERIDEX ) 0.12 % solution 15 mL  15 mL Mouth/Throat Once Sheldon Standing, MD       [facility] Hold] HYDROmorphone  (DILAUDID ) injection 0.5 mg  0.5 mg Intravenous Q3H PRN Jadine Toribio SQUIBB, MD       lactated ringers  infusion   Intravenous Continuous Regalado, Belkys A, MD 100 mL/hr at 11/11/23 0822 New Bag at 11/11/23 0822   [MAR Hold] ondansetron  (ZOFRAN ) injection 4 mg  4 mg Intravenous Q4H PRN Tammy Sor, PA-C       [MAR Hold] oxyCODONE  (Oxy IR/ROXICODONE ) immediate release tablet  5-10 mg  5-10 mg Oral Q4H PRN Simaan, Elizabeth S, PA-C   5 mg at 11/11/23 0142   [MAR Hold] pantoprazole  (PROTONIX ) injection 40 mg  40 mg Intravenous Q12H Beather Delon Gibson, [place]   40 mg at 11/10/23 2019   [MAR Hold] prochlorperazine  (COMPAZINE ) injection 5-10 mg  5-10 mg Intravenous Q4H PRN Augustus Almarie RAMAN, PA-C       scopolamine  (TRANSDERM-SCOP) 1 MG/3DAYS 1 mg  1 patch Transdermal Once Leonce Athens, MD         No Known Allergies  BP 105/75   Pulse (!) 56   Temp 98.8 F (37.1 C) (Oral)   Resp 18   Ht 5' 3 (1.6 m)   Wt 82.6 kg   LMP 11/06/2023 Comment: Pregnancy serum test (-) negative on 11/09/2023  SpO2 98%   Breastfeeding No   BMI 32.26 kg/m   Labs: Results for orders placed or performed during the hospital encounter of 11/09/23 (  from the past 48 hours)  Lipase, blood     Status: None   Collection Time: 11/09/23 10:21 PM  Result Value Ref Range   Lipase 27 11 - 51 U/L    Comment: Performed at [facility], [address], [place], [place] [address]  Comprehensive metabolic panel     Status: Abnormal   Collection Time: 11/09/23 10:21 PM  Result Value Ref Range   Sodium 139 135 - 145 mmol/L   Potassium 3.9 3.5 - 5.1 mmol/L   Chloride 101 98 - 111 mmol/L   CO2 27 22 - 32 mmol/L   Glucose, Bld 103 (H) 70 - 99 mg/dL    Comment: Glucose reference range applies only to samples taken after fasting for at least 8 hours.   BUN 7 6 - 20 mg/dL   Creatinine, Ser 9.47 0.44 - 1.00 mg/dL   Calcium  10.0 8.9 - 10.3 mg/dL   Total Protein 7.1 6.5 - 8.1 g/dL   Albumin 4.6 3.5 - 5.0 g/dL   AST 738 (H) 15 - 41 U/L   ALT 185 (H) 0 - 44 U/L   Alkaline Phosphatase 117 38 - 126 U/L   Total Bilirubin 0.9 0.0 - 1.2 mg/dL   GFR, Estimated >39 >39 mL/min    Comment: (NOTE) Calculated using the CKD-EPI Creatinine Equation (2021)    Anion gap 10 5 - 15    Comment: Performed at [facility], [address] [place], [place] [address]  CBC      Status: None   Collection Time: 11/09/23 10:21 PM  Result Value Ref Range   WBC 8.3 4.0 - 10.5 K/uL   RBC 4.19 3.87 - 5.11 MIL/uL   Hemoglobin 12.4 12.0 - 15.0 g/dL   HCT 62.3 63.9 - 53.9 %   MCV 89.7 80.0 - 100.0 fL   MCH 29.6 26.0 - 34.0 pg   MCHC 33.0 30.0 - 36.0 g/dL   RDW 87.1 88.4 - 84.4 %   Platelets 266 150 - 400 K/uL   nRBC 0.0 0.0 - 0.2 %    Comment: Performed at [facility], [address] [place], [place] [address]  hCG, serum, qualitative     Status: None   Collection Time: 11/09/23 10:21 PM  Result Value Ref Range   Preg, Serum NEGATIVE NEGATIVE    Comment:        THE SENSITIVITY OF THIS METHODOLOGY IS >10 mIU/mL. Performed at [facility], [address] [place], [place] [address]   Urinalysis, Routine w reflex microscopic -Urine, Clean Catch     Status: Abnormal   Collection Time: 11/10/23 12:43 AM  Result Value Ref Range   Color, Urine AMBER (A) YELLOW    Comment: BIOCHEMICALS MAY BE AFFECTED BY COLOR   APPearance CLOUDY (A) CLEAR   Specific Gravity, Urine 1.014 1.005 - 1.030   pH 7.0 5.0 - 8.0   Glucose, UA NEGATIVE NEGATIVE mg/dL   Hgb urine dipstick LARGE (A) NEGATIVE   Bilirubin Urine NEGATIVE NEGATIVE   Ketones, ur NEGATIVE NEGATIVE mg/dL   Protein, ur 30 (A) NEGATIVE mg/dL   Nitrite NEGATIVE NEGATIVE   Leukocytes,Ua TRACE (A) NEGATIVE   RBC / HPF >50 0 - 5 RBC/hpf   WBC, UA >50 0 - 5 WBC/hpf   Bacteria, UA FEW (A) NONE SEEN   Squamous Epithelial / HPF 0-5 0 - 5 /HPF   Mucus PRESENT    Amorphous Crystal PRESENT  Comment: Performed at [facility], [address], [place], [place] [address]  Hepatic function panel     Status: Abnormal   Collection Time: 11/10/23  4:29 AM  Result Value Ref Range   Total Protein 5.8 (L) 6.5 - 8.1 g/dL   Albumin 3.8 3.5 - 5.0 g/dL   AST 398 (H) 15 - 41 U/L   ALT 531 (H) 0 - 44 U/L   Alkaline Phosphatase 116 38 - 126 U/L   Total Bilirubin 1.2 0.0 - 1.2  mg/dL   Bilirubin, Direct 0.9 (H) 0.0 - 0.2 mg/dL   Indirect Bilirubin 0.4 0.3 - 0.9 mg/dL    Comment: Performed at [facility], [address] [place], [place] [address]  Hepatitis panel, acute     Status: None   Collection Time: 11/10/23  4:29 AM  Result Value Ref Range   Hepatitis B Surface Ag NON REACTIVE NON REACTIVE   HCV Ab NON REACTIVE NON REACTIVE    Comment: (NOTE) Nonreactive HCV antibody screen is consistent with no HCV infections,  unless recent infection is suspected or other evidence exists to indicate HCV infection.     Hep A IgM NON REACTIVE NON REACTIVE   Hep B C IgM NON REACTIVE NON REACTIVE    Comment: Performed at [facility], [address], [place], [place] [address]  Acetaminophen  level     Status: Abnormal   Collection Time: 11/10/23  5:56 AM  Result Value Ref Range   Acetaminophen  (Tylenol ), Serum <10 (L) 10 - 30 ug/mL    Comment: (NOTE) Toxic concentrations can be more effectively related to post dose interval; > 200, > 100, and > 50 ug/mL serum concentrations correspond to toxic concentrations at 4, 8, and 12 hours post dose, respectively.  Performed at [facility], [address], [place], [place] [address]   Basic metabolic panel     Status: Abnormal   Collection Time: 11/10/23  5:56 AM  Result Value Ref Range   Sodium 143 135 - 145 mmol/L   Potassium 3.3 (L) 3.5 - 5.1 mmol/L   Chloride 111 98 - 111 mmol/L   CO2 22 22 - 32 mmol/L   Glucose, Bld 94 70 - 99 mg/dL    Comment: Glucose reference range applies only to samples taken after fasting for at least 8 hours.   BUN 6 6 - 20 mg/dL   Creatinine, Ser 9.51 0.44 - 1.00 mg/dL   Calcium  7.5 (L) 8.9 - 10.3 mg/dL    Comment: Delta check noted    GFR, Estimated >60 >60 mL/min    Comment: (NOTE) Calculated using the CKD-EPI Creatinine Equation (2021)    Anion gap 11 5 - 15    Comment: Performed at [facility], [address] [place], [place] [address]  Hepatic function panel     Status: Abnormal   Collection Time: 11/10/23  5:56 AM  Result Value Ref Range   Total Protein 5.3 (L) 6.5 - 8.1 g/dL   Albumin 3.5 3.5 - 5.0 g/dL   AST 370 (H) 15 - 41 U/L   ALT 587 (H) 0 - 44 U/L   Alkaline Phosphatase 110 38 - 126 U/L   Total Bilirubin 1.3 (H) 0.0 - 1.2 mg/dL   Bilirubin, Direct 0.9 (H) 0.0 - 0.2 mg/dL   Indirect Bilirubin 0.4 0.3 - 0.9 mg/dL    Comment: Performed at [facility], [address] [place], [place]  [address]  CBC with Differential/Platelet     Status: Abnormal   Collection Time: 11/10/23  5:56 AM  Result Value Ref Range   WBC 6.3 4.0 - 10.5 K/uL   RBC 3.68 (L) 3.87 - 5.11 MIL/uL   Hemoglobin 11.1 (L) 12.0 - 15.0 g/dL   HCT 66.5 (L) 63.9 - 53.9 %   MCV 90.8 80.0 - 100.0 fL   MCH 30.2 26.0 - 34.0 pg   MCHC 33.2 30.0 - 36.0 g/dL   RDW 86.9 88.4 - 84.4 %   Platelets 220 150 - 400 K/uL   nRBC 0.0 0.0 - 0.2 %   Neutrophils Relative % 53 %   Neutro Abs 3.3 1.7 - 7.7 K/uL   Lymphocytes Relative 37 %   Lymphs Abs 2.3 0.7 - 4.0 K/uL   Monocytes Relative 8 %   Monocytes Absolute 0.5 0.1 - 1.0 K/uL   Eosinophils Relative 1 %   Eosinophils Absolute 0.0 0.0 - 0.5 K/uL   Basophils Relative 0 %   Basophils Absolute 0.0 0.0 - 0.1 K/uL   Immature Granulocytes 1 %   Abs Immature Granulocytes 0.04 0.00 - 0.07 K/uL    Comment: Performed at [facility], [address] [place], [place] [address]  Troponin T, High Sensitivity     Status: None   Collection Time: 11/10/23  5:56 AM  Result Value Ref Range   Troponin T High Sensitivity <15 0 - 19 ng/L    Comment: (NOTE) Biotin concentrations > 1000 ng/mL falsely decrease TnT results.  Serial cardiac troponin measurements are suggested.  Refer to the Links section for chest pain algorithms and additional  guidance. Performed at [facility], [address], [place], [place] [address]   Lactic acid,  plasma     Status: None   Collection Time: 11/10/23  6:53 AM  Result Value Ref Range   Lactic Acid, Venous 1.7 0.5 - 1.9 mmol/L    Comment: Performed at [facility], [address] [place], [place] [address]  HIV Antibody (routine testing w rflx)     Status: None   Collection Time: 11/10/23  6:53 AM  Result Value Ref Range   HIV Screen 4th Generation wRfx Non Reactive Non Reactive    Comment: Performed at [facility], [address] [place], [place] [address]  Protime-INR     Status: None   Collection Time: 11/10/23  6:53 AM  Result Value Ref Range   Prothrombin Time 14.1 11.4 - 15.2 seconds   INR 1.0 0.8 - 1.2    Comment: (NOTE) INR goal varies based on device and disease states. Performed at [facility], [address], [place], [place] [address]   Comprehensive metabolic panel     Status: Abnormal   Collection Time: 11/11/23  4:44 AM  Result Value Ref Range   Sodium 140 135 - 145 mmol/L   Potassium 4.0 3.5 - 5.1 mmol/L   Chloride 106 98 - 111 mmol/L   CO2 24 22 - 32 mmol/L   Glucose, Bld 100 (H) 70 - 99 mg/dL    Comment: Glucose reference range applies only to samples taken after fasting for at least 8 hours.   BUN 6 6 - 20 mg/dL   Creatinine, Ser 9.43 0.44 - 1.00 mg/dL   Calcium  8.7 (L) 8.9 - 10.3 mg/dL   Total Protein 5.8 (L) 6.5 - 8.1 g/dL   Albumin 3.7 3.5 - 5.0 g/dL  AST 293 (H) 15 - 41 U/L   ALT 689 (H) 0 - 44 U/L   Alkaline Phosphatase 153 (H) 38 - 126 U/L   Total Bilirubin 0.8 0.0 - 1.2 mg/dL   GFR, Estimated >39 >39 mL/min    Comment: (NOTE) Calculated using the CKD-EPI Creatinine Equation (2021)    Anion gap 11 5 - 15    Comment: Performed at [facility], [address] [place], [place] [address]  CBC     Status: Abnormal   Collection Time: 11/11/23  4:44 AM  Result Value Ref Range   WBC 6.0 4.0 - 10.5 K/uL   RBC 3.78 (L) 3.87 - 5.11 MIL/uL   Hemoglobin 11.4 (L) 12.0 - 15.0 g/dL   HCT  65.8 (L) 63.9 - 46.0 %   MCV 90.2 80.0 - 100.0 fL   MCH 30.2 26.0 - 34.0 pg   MCHC 33.4 30.0 - 36.0 g/dL   RDW 86.9 88.4 - 84.4 %   Platelets 242 150 - 400 K/uL   nRBC 0.0 0.0 - 0.2 %    Comment: Performed at [facility], [address] [place], [place] [address]    Imaging / Studies: CT Angio Chest/Abd/Pel for Dissection W and/or W/WO Result Date: 11/10/2023 CLINICAL DATA:  Chest and abdominal pain EXAM: CT ANGIOGRAPHY CHEST, ABDOMEN AND PELVIS TECHNIQUE: Non-contrast CT of the chest was initially obtained. Multidetector CT imaging through the chest, abdomen and pelvis was performed using the standard protocol during bolus administration of intravenous contrast. Multiplanar reconstructed images and MIPs were obtained and reviewed to evaluate the vascular anatomy. RADIATION DOSE REDUCTION: This exam was performed according to the departmental dose-optimization program which includes automated exposure control, adjustment of the mA and/or kV according to patient size and/or use of iterative reconstruction technique. CONTRAST:  OMNIPAQUE  IOHEXOL  350 MG/ML SOLN COMPARISON:  None Available. FINDINGS: CTA CHEST FINDINGS Cardiovascular: Initial precontrast images show no acute abnormality. Post-contrast images demonstrate the thoracic aorta to have no aneurysmal dilatation or dissection. The heart is at the upper limits of normal in size. Pulmonary artery shows a normal branching pattern without intraluminal filling defect. Mediastinum/Nodes: Thoracic inlet is within normal limits. No hilar or mediastinal adenopathy is noted. The esophagus as visualized is within normal limits. Lungs/Pleura: Lungs are well aerated bilaterally. No focal infiltrate or sizable effusion is seen. No parenchymal nodules are noted. Musculoskeletal: No chest wall abnormality. No acute or significant osseous findings. Review of the MIP images confirms the above findings. CTA ABDOMEN AND PELVIS FINDINGS  VASCULAR Aorta: Abdominal aorta demonstrates no atherosclerotic calcification. No aneurysmal dilatation or dissection is seen. Celiac: Median arcuate ligament compression is noted. Mild poststenotic dilatation is seen. SMA: Patent without evidence of aneurysm, dissection, vasculitis or significant stenosis. Renals: Both renal arteries are patent without evidence of aneurysm, dissection, vasculitis, fibromuscular dysplasia or significant stenosis. Dual renal arteries are noted on the right. IMA: Patent without evidence of aneurysm, dissection, vasculitis or significant stenosis. Inflow: Iliacs are well visualized and within normal limits. Veins: No specific venous abnormality is noted. Review of the MIP images confirms the above findings. NON-VASCULAR Hepatobiliary: No focal liver abnormality is seen. No gallstones, gallbladder wall thickening, or biliary dilatation. Pancreas: Unremarkable. No pancreatic ductal dilatation or surrounding inflammatory changes. Spleen: Normal in size without focal abnormality. Adrenals/Urinary Tract: Adrenal glands are within normal limits. Kidneys are well visualized bilaterally. No renal calculi or obstructive changes are noted. The bladder is partially distended. Stomach/Bowel: No obstructive or inflammatory  changes of the colon are noted. The appendix is within normal limits. Small bowel and stomach are unremarkable. Lymphatic: No lymphadenopathy is noted. Reproductive: Uterus and bilateral adnexa are unremarkable. Other: No abdominal wall hernia or abnormality. No abdominopelvic ascites. Musculoskeletal: No acute or significant osseous findings. Review of the MIP images confirms the above findings. IMPRESSION: CTA of the chest: No evidence of pulmonary emboli. No acute aortic abnormality is seen. No acute abnormality seen. CTA of the abdomen and pelvis: No acute aortic abnormality is noted. 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 No other focal  abnormality is noted. Electronically Signed   By: Oneil Devonshire M.D.   On: 11/10/2023 03:12   US  Abdomen Limited RUQ (LIVER/GB) Result Date: 11/10/2023 CLINICAL DATA:  151471 RUQ pain 151471 EXAM: ULTRASOUND ABDOMEN LIMITED RIGHT UPPER QUADRANT COMPARISON:  None Available. FINDINGS: Gallbladder: Subcentimeter calcified gallstone within the gallbladder lumen. No gallbladder wall thickening or pericholecystic fluid visualized. No sonographic Murphy sign noted by sonographer. Common bile duct: Diameter: 2 mm Liver: No focal lesion identified. Increased parenchymal echogenicity. Portal vein is patent on color Doppler imaging with normal direction of blood flow towards the liver. Other: None. IMPRESSION: 1. Cholelithiasis with no acute cholecystitis. 2. Hepatic steatosis. Please note limited evaluation for focal hepatic masses in a patient with hepatic steatosis due to decreased penetration of the acoustic ultrasound waves. Electronically Signed   By: Morgane  Naveau M.D.   On: 11/10/2023 01:48     .Elspeth KYM Schultze, M.D., F.A.C.S. Gastrointestinal and Minimally Invasive [facility] [address] [place], [place] [address] [phone] Main / Paging  11/11/2023 8:26 AM   Elspeth JAYSON Schultze

## 2023-11-11 NOTE — Discharge Instructions (Signed)
 ################################################################  LAPAROSCOPIC SURGERY: POST OP INSTRUCTIONS  ######################################################################  EAT Gradually transition to a high fiber diet with a fiber supplement over the next few weeks after discharge.  Start with a pureed / full liquid diet (see below)  WALK Walk an hour a day.  Control your pain to do that.    CONTROL PAIN Control pain so that you can walk, sleep, tolerate sneezing/coughing, go up/down stairs.  HAVE A BOWEL MOVEMENT DAILY Keep your bowels regular to avoid problems.  OK to try a laxative to override constipation.  OK to use an antidairrheal to slow down diarrhea.  Call if not better after 2 tries  CALL IF YOU HAVE PROBLEMS/CONCERNS Call if you are still struggling despite following these instructions. Call if you have concerns not answered by these instructions  ######################################################################    DIET: Follow a light bland diet & liquids the first 24 hours after arrival home, such as soup, liquids, starches, etc.  Be sure to drink plenty of fluids.  Quickly advance to a usual solid diet within a few days.  Avoid fast food or heavy meals as your are more likely to get nauseated or have irregular bowels.  A low-fat, high-fiber diet for the rest of your life is ideal.  Take your usually prescribed home medications unless otherwise directed. Blood thinners:  You can restart any strong blood thinners after the second postoperative day  for example: COUMADIN (warfarin), XERELTO (rivaroxaban), ELIQUIS (apixaban), PLAVIX (clopidigrel), BRILINTA (ticagrelor), EFFIENT (prasugrel), PRADAXA (dabigatran), etc  Continue aspirin before & after surgery..     Some oozing/bleeding the first 1-2 weeks is common but should taper down & be small volume.    If you are passing many large clots or having uncontrolling bleeding, call your surgeon  PAIN  CONTROL: Pain is best controlled by a usual combination of three different methods TOGETHER: Ice/Heat Over the counter pain medication Prescription pain medication Most patients will experience some swelling and bruising around the incisions.  Ice packs or heating pads (30-60 minutes up to 6 times a day) will help. Use ice for the first few days to help decrease swelling and bruising, then switch to heat to help relax tight/sore spots and speed recovery.  Some people prefer to use ice alone, heat alone, alternating between ice & heat.  Experiment to what works for you.  Swelling and bruising can take several weeks to resolve.   It is helpful to take an over-the-counter pain medication regularly for the first few weeks.  Choose one of the following that works best for you: Naproxen  (Aleve , etc)  Two 220mg  tabs twice a day Ibuprofen  (Advil , etc) Three 200mg  tabs four times a day (every meal & bedtime) Acetaminophen  (Tylenol , etc) 500-650mg  four times a day (every meal & bedtime) A  prescription for pain medication (such as oxycodone , hydrocodone, tramadol, gabapentin, methocarbamol , etc) should be given to you upon discharge.  Take your pain medication as prescribed.  If you are having problems/concerns with the prescription medicine (does not control pain, nausea, vomiting, rash, itching, etc), please call us  [phone] to see if we need to switch you to a different pain medicine that will work better for you and/or control your side effect better. If you need a refill on your pain medication, please give us  48 hour notice.  contact your pharmacy.  They will contact our office to request authorization. Prescriptions will not be filled after 5 pm or on week-ends  AVOID GETTING CONSTIPATED.   a.  Between the surgery and the pain medications, it is common to experience some constipation.  b.  Drink plenty of liquids c   ake a fiber supplement 2 times day (such as Metamucil, Citrucel, FiberCon,  MiraLax , etc) to have a bowel movement every day. d.  If you have not had a BM by 2 days after surgery: -drink liquids only until you have a bowel movement - take MiraLAX  2 doses every 2 hours until you have a bowel movement   Watch out for diarrhea.   If you have many loose bowel movements, simplify your diet to bland foods & liquids for a few days.   Stop any stool softeners and decrease your fiber supplement.   Switching to mild anti-diarrheal medications (Kayopectate, Pepto Bismol) can help.   If this worsens or does not improve, please call us .  Wash / shower every day.  You may shower over the dressings as they are waterproof.  Continue to shower over incision(s) after the dressing is off.  It is good for closed incisions and even open wounds to be washed every day.  Shower every day.  Short baths are fine.  Wash the incisions and wounds clean with soap & water .    You may leave closed incisions open to air if it is dry.   You may cover the incision with clean gauze & replace it after your daily shower for comfort.  TEGADERM:  You have clear gauze band-aid dressings over your closed incision(s).  Remove the dressings 2 days after surgery = 9/19 Friday.    ACTIVITIES as tolerated:   You may resume regular (light) daily activities beginning the next day--such as daily self-care, walking, climbing stairs--gradually increasing activities as tolerated.  If you can walk 30 minutes without difficulty, it is safe to try more intense activity such as jogging, treadmill, bicycling, low-impact aerobics, swimming, etc. Save the most intensive and strenuous activity for last such as sit-ups, heavy lifting, contact sports, etc  Refrain from any heavy lifting or straining until you are off narcotics for pain control.   DO NOT PUSH THROUGH PAIN.  Let pain be your guide: If it hurts to do something, don't do it.  Pain is your body warning you to avoid that activity for another week until the pain goes  down. You may drive when you are no longer taking prescription pain medication, you can comfortably wear a seatbelt, and you can safely maneuver your car and apply brakes. You may have sexual intercourse when it is comfortable.  FOLLOW UP in our office Please call [facility] at [phone] to set up an appointment to see your surgeon in the office for a follow-up appointment approximately 2-3 weeks after your surgery. Make sure that you call for this appointment the day you arrive home to insure a convenient appointment time.  10. IF YOU HAVE DISABILITY OR FAMILY LEAVE FORMS, BRING THEM TO THE OFFICE FOR PROCESSING.  DO NOT GIVE THEM TO YOUR DOCTOR.   WHEN TO CALL US  [phone]: Poor pain control Reactions / problems with new medications (rash/itching, nausea, etc)  Fever over 101.5 F (38.5 C) Inability to urinate Nausea and/or vomiting Worsening swelling or bruising Continued bleeding from incision. Increased pain, redness, or drainage from the incision   The clinic staff is available to answer your questions during regular business hours (8:30am-5pm).  Please don't hesitate to call and ask to speak to one of our nurses for clinical concerns.   If you have  a medical emergency, go to the nearest emergency room or call 911.  A surgeon from [facility] is always on call at the [facility], [place] [address], [address], [place], [place]  [address] ? MAIN: [phone] ? TOLL FREE: [phone] ?  FAX [phone] [email or web address]  ##############################################################

## 2023-11-11 NOTE — Plan of Care (Signed)

## 2023-11-11 NOTE — Anesthesia Procedure Notes (Signed)
 Procedure Name: Intubation Date/Time: 11/11/2023 11:02 AM  Performed by: Leonce Athens, MDPre-anesthesia Checklist: Patient identified, Emergency Drugs available, Suction available, Patient being monitored and Timeout performed Patient Re-evaluated:Patient Re-evaluated prior to induction Oxygen Delivery Method: Circle system utilized Preoxygenation: Pre-oxygenation with 100% oxygen Induction Type: IV induction Ventilation: Mask ventilation without difficulty Laryngoscope Size: 3 Grade View: Grade I Tube type: Oral Tube size: 7.5 mm Number of attempts: 3 (EMT student failed x2, easy mask) Placement Confirmation: ETT inserted through vocal cords under direct vision, positive ETCO2 and breath sounds checked- equal and bilateral Secured at: 22 cm Tube secured with: Tape Dental Injury: Teeth and Oropharynx as per pre-operative assessment

## 2023-11-11 NOTE — Progress Notes (Signed)
 PROGRESS NOTE    Rylinn Linzy  FMW:[id number] DOB: [birth year] DOA: 11/09/2023 PCP: Patient, No Pcp Per   Brief Narrative: 33 year old with past medical history significant for reflux presenting with abdominal pain sudden onset epigastric area radiated to upper back.  She has had follow-up with GI in the past 2021 for abdominal discomfort at that time she had endoscopy and colonoscopy which were unremarkable.  She presented with transaminases, lipase was normal.  Ultrasound of the abdomen showed gallstone, no evidence of cholecystitis.  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     Assessment & Plan:   Principal Problem:   Abdominal pain Active Problems:   Elevated LFTs   Anemia   Biliary colic   Transaminitis   Cholelithiasis   1-Cholelithiasis  Biliary Cholic  Abdominal pain, gallstone, transaminases Evaluated by Sx and GI.  Underwent cholecystectomy today.  Post op diagnosis: acute on chronic cholecystitis.  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 Unlikely medial arcuate ligament compression of celiac axis  Symptoms related to acute on chronic Cholecystitis.   Pyuria -Urine culture , multiples bacterial morphotype's.  Received IV antibiotics pre op.  Repeat urine culture.   Normocytic  anemia -check anemia panel in am.    Hepatic steatosis, possible serohepatitis. Intra surgical bx obtain.   Estimated body mass index is 32.24 kg/m as calculated from the following:   Height as of this encounter: 5' 3 (1.6 m).   Weight as of this encounter: 82.6 kg.   DVT prophylaxis: SCD Code Status: Full code Family Communication:care discussed with patient Disposition Plan:  Status is: Observation The patient remains OBS appropriate and will d/c before 2 midnights.    Consultants:  General  sx GI  Procedures:   Antimicrobials:    Subjective: She is alert, seen post op. Report mil;d abdominal pain.   Objective: Vitals:   11/10/23 1508 11/10/23 2048 11/11/23 0139 11/11/23 0555  BP:  100/61 (!) 109/53 (!) 99/55  Pulse:  (!) 51 (!) 50 (!) 55  Resp:  18 18 18   Temp:  99 F (37.2 C) 98.6 F (37 C) 98.9 F (37.2 C)  TempSrc:  Oral Oral Oral  SpO2:  95% 98% 94%  Weight: 82.6 kg     Height: 5' 3 (1.6 m)       Intake/Output Summary (Last 24 hours) at 11/11/2023 0722 Last data filed at 11/11/2023 0600 Gross per 24 hour  Intake 2481.67 ml  Output 400 ml  Net 2081.67 ml   Filed Weights   11/10/23 1508  Weight: 82.6 kg    Examination:  General exam: Appears calm and comfortable  Respiratory system: Clear to auscultation. Respiratory effort normal. Cardiovascular system: S1 & S2 heard, RRR. No JVD, murmurs, rubs, gallops or clicks. No pedal edema. Gastrointestinal system: Abdomen is nondistended, soft and nontender. No organomegaly or masses felt. Normal bowel sounds heard. Central nervous system: Alert and oriented. No focal neurological deficits. Extremities: Symmetric 5 x 5 power. Skin: No rashes, lesions or ulcers Psychiatry: Judgement and insight appear normal. Mood & affect appropriate.     Data Reviewed: I have personally reviewed following labs and imaging studies  CBC: Recent Labs  Lab 11/09/23 2221 11/10/23 0556 11/11/23 0444  WBC 8.3 6.3 6.0  NEUTROABS  --  3.3  --   HGB 12.4 11.1* 11.4*  HCT 37.6 33.4* 34.1*  MCV 89.7 90.8 90.2  PLT 266 220 242   Basic Metabolic Panel: Recent Labs  Lab 11/09/23 2221 11/10/23 0556 11/11/23 0444  NA 139 143 140  K 3.9 3.3* 4.0  CL 101 111 106  CO2 27 22 24   GLUCOSE 103* 94 100*  BUN 7 6 6   CREATININE 0.52 0.48 0.56  CALCIUM  10.0 7.5* 8.7*   GFR: Estimated Creatinine Clearance: 100.9 mL/min (by C-G formula based on SCr of 0.56 mg/dL). Liver Function Tests: Recent Labs  Lab 11/09/23 2221  11/10/23 0429 11/10/23 0556 11/11/23 0444  AST 261* 601* 629* 293*  ALT 185* 531* 587* 689*  ALKPHOS 117 116 110 153*  BILITOT 0.9 1.2 1.3* 0.8  PROT 7.1 5.8* 5.3* 5.8*  ALBUMIN 4.6 3.8 3.5 3.7   Recent Labs  Lab 11/09/23 2221  LIPASE 27   No results for input(s): AMMONIA in the last 168 hours. Coagulation Profile: Recent Labs  Lab 11/10/23 0653  INR 1.0   Cardiac Enzymes: No results for input(s): CKTOTAL, CKMB, CKMBINDEX, TROPONINI in the last 168 hours. BNP (last 3 results) No results for input(s): PROBNP in the last 8760 hours. HbA1C: No results for input(s): HGBA1C in the last 72 hours. CBG: No results for input(s): GLUCAP in the last 168 hours. Lipid Profile: No results for input(s): CHOL, HDL, LDLCALC, TRIG, CHOLHDL, LDLDIRECT in the last 72 hours. Thyroid Function Tests: No results for input(s): TSH, T4TOTAL, FREET4, T3FREE, THYROIDAB in the last 72 hours. Anemia Panel: No results for input(s): VITAMINB12, FOLATE, FERRITIN, TIBC, IRON, RETICCTPCT in the last 72 hours. Sepsis Labs: Recent Labs  Lab 11/10/23 0653  LATICACIDVEN 1.7    No results found for this or any previous visit (from the past 240 hours).       Radiology Studies: CT Angio Chest/Abd/Pel for Dissection W and/or W/WO Result Date: 11/10/2023 CLINICAL DATA:  Chest and abdominal pain EXAM: CT ANGIOGRAPHY CHEST, ABDOMEN AND PELVIS TECHNIQUE: Non-contrast CT of the chest was initially obtained. Multidetector CT imaging through the chest, abdomen and pelvis was performed using the standard protocol during bolus administration of intravenous contrast. Multiplanar reconstructed images and MIPs were obtained and reviewed to evaluate the vascular anatomy. RADIATION DOSE REDUCTION: This exam was performed according to the departmental dose-optimization program which includes automated exposure control, adjustment of the mA and/or kV according to patient  size and/or use of iterative reconstruction technique. CONTRAST:  OMNIPAQUE  IOHEXOL  350 MG/ML SOLN COMPARISON:  None Available. FINDINGS: CTA CHEST FINDINGS Cardiovascular: Initial precontrast images show no acute abnormality. Post-contrast images demonstrate the thoracic aorta to have no aneurysmal dilatation or dissection. The heart is at the upper limits of normal in size. Pulmonary artery shows a normal branching pattern without intraluminal filling defect. Mediastinum/Nodes: Thoracic inlet is within normal limits. No hilar or mediastinal adenopathy is noted. The esophagus as visualized is within normal limits. Lungs/Pleura: Lungs are well aerated bilaterally. No focal infiltrate or sizable effusion is seen. No parenchymal nodules are noted. Musculoskeletal: No chest wall abnormality. No acute or significant osseous findings. Review of the MIP images confirms the above findings. CTA ABDOMEN AND PELVIS FINDINGS VASCULAR Aorta: Abdominal aorta demonstrates no atherosclerotic calcification. No aneurysmal dilatation or dissection is seen. Celiac: Median arcuate ligament compression is noted. Mild poststenotic dilatation is seen. SMA: Patent without evidence of aneurysm, dissection, vasculitis or significant stenosis. Renals: Both renal arteries are patent without evidence of aneurysm, dissection, vasculitis, fibromuscular dysplasia or significant stenosis. Dual renal arteries are  noted on the right. IMA: Patent without evidence of aneurysm, dissection, vasculitis or significant stenosis. Inflow: Iliacs are well visualized and within normal limits. Veins: No specific venous abnormality is noted. Review of the MIP images confirms the above findings. NON-VASCULAR Hepatobiliary: No focal liver abnormality is seen. No gallstones, gallbladder wall thickening, or biliary dilatation. Pancreas: Unremarkable. No pancreatic ductal dilatation or surrounding inflammatory changes. Spleen: Normal in size without focal  abnormality. Adrenals/Urinary Tract: Adrenal glands are within normal limits. Kidneys are well visualized bilaterally. No renal calculi or obstructive changes are noted. The bladder is partially distended. Stomach/Bowel: No obstructive or inflammatory changes of the colon are noted. The appendix is within normal limits. Small bowel and stomach are unremarkable. Lymphatic: No lymphadenopathy is noted. Reproductive: Uterus and bilateral adnexa are unremarkable. Other: No abdominal wall hernia or abnormality. No abdominopelvic ascites. Musculoskeletal: No acute or significant osseous findings. Review of the MIP images confirms the above findings. IMPRESSION: CTA of the chest: No evidence of pulmonary emboli. No acute aortic abnormality is seen. No acute abnormality seen. CTA of the abdomen and pelvis: No acute aortic abnormality is noted. 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 No other focal abnormality is noted. Electronically Signed   By: Oneil Devonshire M.D.   On: 11/10/2023 03:12   US  Abdomen Limited RUQ (LIVER/GB) Result Date: 11/10/2023 CLINICAL DATA:  151471 RUQ pain 151471 EXAM: ULTRASOUND ABDOMEN LIMITED RIGHT UPPER QUADRANT COMPARISON:  None Available. FINDINGS: Gallbladder: Subcentimeter calcified gallstone within the gallbladder lumen. No gallbladder wall thickening or pericholecystic fluid visualized. No sonographic Murphy sign noted by sonographer. Common bile duct: Diameter: 2 mm Liver: No focal lesion identified. Increased parenchymal echogenicity. Portal vein is patent on color Doppler imaging with normal direction of blood flow towards the liver. Other: None. IMPRESSION: 1. Cholelithiasis with no acute cholecystitis. 2. Hepatic steatosis. Please note limited evaluation for focal hepatic masses in a patient with hepatic steatosis due to decreased penetration of the acoustic ultrasound waves. Electronically Signed   By: Morgane  Naveau M.D.   On: 11/10/2023 01:48         Scheduled Meds:  pantoprazole  (PROTONIX ) IV  40 mg Intravenous Q12H   Continuous Infusions:   LOS: 0 days    Time spent: 35 minutes    Brittney Caraway A Jhase Creppel, MD [facility]   If 7PM-7AM, please contact night-coverage [email or web address]  11/11/2023, 7:22 AM

## 2023-11-11 NOTE — Transfer of Care (Signed)
 Immediate Anesthesia Transfer of Care Note  Patient: Anne Whitaker  Procedure(s) Performed: LAPAROSCOPIC CHOLECYSTECTOMY SINGLE SITE  Patient Location: PACU  Anesthesia Type:General  Level of Consciousness: awake and alert   Airway & Oxygen Therapy: Patient Spontanous Breathing and Patient connected to nasal cannula oxygen  Post-op Assessment: Report given to RN and Post -op Vital signs reviewed and stable  Post vital signs: Reviewed and stable  Last Vitals:  Vitals Value Taken Time  BP 114/66 11/11/23 12:30  Temp    Pulse 57 11/11/23 12:37  Resp 14 11/11/23 12:37  SpO2 99 % 11/11/23 12:37  Vitals shown include unfiled device data.  Last Pain:  Vitals:   11/11/23 0806  TempSrc: Oral  PainSc: 3       Patients Stated Pain Goal: 5 (11/11/23 0806)  Complications: No notable events documented.

## 2023-11-11 NOTE — Op Note (Signed)
 11/11/2023  PATIENT:  Anne Whitaker  33 y.o. female  Patient Care Team: Patient, No Pcp Per as PCP - General (General Practice) Lequita Evalene LABOR, MD as PCP - OBGYN (Obstetrics and Gynecology)  PRE-OPERATIVE DIAGNOSIS:    Chronic Calculus cholecystitis  POST-OPERATIVE DIAGNOSIS:   Acute on Chronic Calculus Cholecystitis Hepatic steatosis, possible steatohepatitis  PROCEDURE:  Core Liver Biopsy (CPT code 52998) & SINGLE SITE Laparoscopic cholecystectomy (CPT code 52437)  SURGEON:  Elspeth KYM Schultze, MD, FACS.  ASSISTANT:  (n/a)   ANESTHESIA:  General endotracheal intubation anesthesia (GETA) and Local & regional field block at incision(s) for perioperative & postoperative pain control provided with 30mL of bupivicaine 0.25% with epinephrine   Estimated Blood Loss (EBL):   No intake/output data recorded..   (See anesthesia record)  Delay start of Pharmacological VTE agent (>24hrs) due to concerns of significant anemia, surgical blood loss, or risk of bleeding?:  no  DRAINS: (None)  SPECIMEN:  Gallbladder and Core needle liver biopsies  DISPOSITION OF SPECIMEN:  Pathology  COUNTS:  Sponge, needle, & instrument counts CORRECT at the conclusion of the case.      PLAN OF CARE: Admit to inpatient   PATIENT DISPOSITION:  PACU - hemodynamically stable.  INDICATION: 33 year old woman with intermittent episodes of upper abdominal pain.  Prior ER visits and GI workup.  Has had 2 episodes at least with food triggering.  Had more intense food trigger with pain in right upper quadrant radiating to back.  Rest of the differential diagnosis underwhelming.  Known cholelithiasis.  Suspicious for symptomatic cholelithiasis and probable chronic cholecystitis.  Recommended cholecystectomy.  Increased liver function test.  Possible need for liver biopsy as well  The anatomy & physiology of hepatobiliary & pancreatic function was discussed.  The pathophysiology of gallbladder dysfunction was  discussed.  Natural history risks without surgery was discussed.   I feel the risks of no intervention will lead to serious problems that outweigh the operative risks; therefore, I recommended cholecystectomy to remove the pathology.  I explained laparoscopic techniques with possible need for an open approach.  Probable cholangiogram to evaluate the bilary tract was explained as well.    Risks such as bleeding, infection, abscess, leak, injury to other organs, need for further treatment, heart attack, death, and other risks were discussed.  I noted a good likelihood this will help address the problem.  Possibility that this will not correct all abdominal symptoms was explained.  Goals of post-operative recovery were discussed as well.  We will work to minimize complications.  An educational handout further explaining the pathology and treatment options was given as well.  Questions were answered.  The patient expresses understanding & wishes to proceed with surgery.  OR FINDINGS:   Edematous gallbladder with some inflammation suspicious for acute on chronic cholecystitis.  Very fibrotic infundibulum and proximal cystic duct highly suspicious for partial cystic duct obstruction chronically.  No impacted stones in the cystic duct.  Cholangiogram not possible given very narrow and fragile cystic duct.  Liver: Fatty change with probable steatohepatitis.  Core liver biopsies done.  DESCRIPTION:   The patient was identified & brought in the operating room. The patient was positioned supine with arms tucked. SCDs were active during the entire case. The patient underwent general anesthesia without any difficulty.  The abdomen was prepped and draped in a sterile fashion. A Surgical Timeout confirmed our plan.  I made a transverse curvilinear incision through the superior umbilical fold.  I placed a 5mm long  port through the supraumbilical fascia using a modified Hassan cutdown technique with umbilical stalk  fascial countertraction. I began carbon dioxide insufflation.  No change in end tidal CO2 measurement.   Camera inspection revealed no injury. There were no adhesions to the anterior abdominal wall supraumbilically.  I proceeded to continue with single site technique. I placed a #5 port in left upper aspect of the wound. I placed a 5 mm atraumatic grasper in the right inferior aspect of the wound.  I turned attention to the right upper quadrant.  Operative findings as noted above.  The gallbladder fundus was elevated cephalad. I freed adhesions to the ventral surface of the gallbladder off carefully.  I freed the peritoneal coverings between the gallbladder and the liver on both the posteriolateral and anteriomedial walls. I alternated between Harmonic & blunt [place]  dissection to help get a good critical view of the cystic artery and cystic duct.  I did further dissection to free 80% of the gallbladder off the liver bed to get a good critical view of the infundibulum and cystic duct.  I dissected out the cystic artery; and, after getting a good 360 view, ligated the anterior & posterior branches of the cystic artery close on the infundibulum of the gallbladder using the Harmonic ultrasonic dissection.  I skeletonized the cystic duct and confirmed a good 360 degree critical view.  I placed a clip on the infundibulum. I did a partial cystic duct-otomy and ensured patency.  I placed a 5 [place] cholangiocatheter through a puncture site at the right subcostal ridge of the abdominal wall and directed it into the cystic duct.  It would not pass.  Milked back and felt no obvious stones.  I repeated a ductotomy a little more proximally below what appeared to be a fibrotic valve close to the infundibulum without able to pass.  Did 1 more a little more proximally.  To get the cholangiogram catheter to But the cystic duct avulsed.  I was able to easily find the cystic duct.   I elevated & placed clips on the viable  cystic duct x4 to completely occlude with each clip.  Remaining stump was healthy and viable. No cholangiogram able to be done. I removed the cholangiocatheter.  Given concerns about the liver, decided proceed with needle biopsies.  Used a 14-gauge Tru-Cut needle and passed through the anterior liver lobe.  Did 4 passes to get 3 adequate liver core specimens.  I assured hemostasis on the liver bed.  I placed the gallbladder inside an EcoSac.  I inspected the rest of the abdomen & detected no injury nor bleeding elsewhere.  Good occlusion of the cystic stump with no leaking of bile.    I removed the EcoSac containing the gallbladder out of the supraumbilical fascia.  I had to open up the fascia to 20mm to safely get it out.  I closed the fascia transversely using #1 PDS interrupted stitches. I closed the skin using 4-0 monocryl stitch.  Sterile dressing was applied. The patient was extubated & arrived in the PACU in stable condition.  I had discussed postoperative care with the patient in the holding area. I discussed operative findings, updated the patient's status, discussed probable steps to recovery, and gave postoperative recommendations to the patient's spouse Ahmed Benhamouche.  Recommendations were made.  Questions were answered.  He expressed understanding & appreciation.  Elspeth KYM Schultze, M.D., F.A.C.S. Gastrointestinal and Minimally Invasive Surgery [facility] [address], [address] [place],  [place] [address] [phone] Main / Paging  11/11/2023 12:23 PM

## 2023-11-11 NOTE — Progress Notes (Addendum)
 error

## 2023-11-12 ENCOUNTER — Encounter (HOSPITAL_COMMUNITY): Payer: Self-pay | Admitting: Surgery

## 2023-11-12 ENCOUNTER — Other Ambulatory Visit (HOSPITAL_COMMUNITY): Payer: Self-pay

## 2023-11-12 DIAGNOSIS — K810 Acute cholecystitis: Secondary | ICD-10-CM

## 2023-11-12 DIAGNOSIS — R1011 Right upper quadrant pain: Secondary | ICD-10-CM

## 2023-11-12 DIAGNOSIS — Z9049 Acquired absence of other specified parts of digestive tract: Secondary | ICD-10-CM

## 2023-11-12 DIAGNOSIS — R748 Abnormal levels of other serum enzymes: Secondary | ICD-10-CM | POA: Diagnosis not present

## 2023-11-12 DIAGNOSIS — D649 Anemia, unspecified: Secondary | ICD-10-CM | POA: Diagnosis not present

## 2023-11-12 DIAGNOSIS — R1013 Epigastric pain: Secondary | ICD-10-CM | POA: Diagnosis not present

## 2023-11-12 DIAGNOSIS — R7989 Other specified abnormal findings of blood chemistry: Secondary | ICD-10-CM | POA: Diagnosis not present

## 2023-11-12 LAB — HEPATIC FUNCTION PANEL
ALT: 536 U/L — ABNORMAL HIGH (ref 0–44)
AST: 124 U/L — ABNORMAL HIGH (ref 15–41)
Albumin: 3.9 g/dL (ref 3.5–5.0)
Alkaline Phosphatase: 147 U/L — ABNORMAL HIGH (ref 38–126)
Bilirubin, Direct: 0.2 mg/dL (ref 0.0–0.2)
Indirect Bilirubin: 0.3 mg/dL (ref 0.3–0.9)
Total Bilirubin: 0.5 mg/dL (ref 0.0–1.2)
Total Protein: 6.4 g/dL — ABNORMAL LOW (ref 6.5–8.1)

## 2023-11-12 LAB — CBC
HCT: 36.5 % (ref 36.0–46.0)
Hemoglobin: 11.8 g/dL — ABNORMAL LOW (ref 12.0–15.0)
MCH: 30.1 pg (ref 26.0–34.0)
MCHC: 32.3 g/dL (ref 30.0–36.0)
MCV: 93.1 fL (ref 80.0–100.0)
Platelets: 263 K/uL (ref 150–400)
RBC: 3.92 MIL/uL (ref 3.87–5.11)
RDW: 13 % (ref 11.5–15.5)
WBC: 12.6 K/uL — ABNORMAL HIGH (ref 4.0–10.5)
nRBC: 0 % (ref 0.0–0.2)

## 2023-11-12 LAB — IRON AND TIBC
Iron: 35 ug/dL (ref 28–170)
Saturation Ratios: 11 % (ref 10.4–31.8)
TIBC: 332 ug/dL (ref 250–450)
UIBC: 297 ug/dL

## 2023-11-12 LAB — BASIC METABOLIC PANEL WITH GFR
Anion gap: 15 (ref 5–15)
BUN: 5 mg/dL — ABNORMAL LOW (ref 6–20)
CO2: 20 mmol/L — ABNORMAL LOW (ref 22–32)
Calcium: 9.1 mg/dL (ref 8.9–10.3)
Chloride: 105 mmol/L (ref 98–111)
Creatinine, Ser: 0.51 mg/dL (ref 0.44–1.00)
GFR, Estimated: >60 mL/min (ref >60)
Glucose, Bld: 110 mg/dL — ABNORMAL HIGH (ref 70–99)
Potassium: 3.8 mmol/L (ref 3.5–5.1)
Sodium: 140 mmol/L (ref 135–145)

## 2023-11-12 LAB — RETICULOCYTES
Immature Retic Fract: 10 % (ref 2.3–15.9)
RBC.: 3.89 MIL/uL (ref 3.87–5.11)
Retic Count, Absolute: 80.9 K/uL (ref 19.0–186.0)
Retic Ct Pct: 2.1 % (ref 0.4–3.1)

## 2023-11-12 LAB — FOLATE: Folate: 15 ng/mL (ref >5.9)

## 2023-11-12 LAB — PROTIME-INR
INR: 1 (ref 0.8–1.2)
Prothrombin Time: 14.2 s (ref 11.4–15.2)

## 2023-11-12 LAB — VITAMIN B12: Vitamin B-12: 715 pg/mL (ref 180–914)

## 2023-11-12 LAB — FERRITIN: Ferritin: 39 ng/mL (ref 11–307)

## 2023-11-12 MED ORDER — SODIUM CHLORIDE 0.9 % IV SOLN
INTRAVENOUS | Status: AC
Start: 2023-11-12 — End: 2023-11-13

## 2023-11-12 MED ORDER — ENOXAPARIN SODIUM 40 MG/0.4ML IJ SOSY
40.0000 mg | PREFILLED_SYRINGE | INTRAMUSCULAR | Status: DC
  Administered 2023-11-12: 40 mg via SUBCUTANEOUS
  Filled 2023-11-12: qty 0.4

## 2023-11-12 MED ORDER — SODIUM CHLORIDE 0.9 % IV SOLN
2.0000 g | INTRAVENOUS | Status: DC
  Administered 2023-11-12: 2 g via INTRAVENOUS
  Filled 2023-11-12: qty 20

## 2023-11-12 MED ORDER — METHOCARBAMOL 500 MG PO TABS
1000.0000 mg | ORAL_TABLET | Freq: Three times a day (TID) | ORAL | Status: DC | PRN
  Administered 2023-11-13: 1000 mg via ORAL
  Filled 2023-11-12: qty 2

## 2023-11-12 MED ORDER — PANTOPRAZOLE SODIUM 40 MG PO TBEC
40.0000 mg | DELAYED_RELEASE_TABLET | Freq: Two times a day (BID) | ORAL | Status: DC
  Administered 2023-11-12: 40 mg via ORAL
  Filled 2023-11-12: qty 1

## 2023-11-12 NOTE — Anesthesia Preprocedure Evaluation (Signed)
 Anesthesia Evaluation  Patient identified by MRN, date of birth, ID band Patient awake    Reviewed: Allergy & Precautions, NPO status , Patient's Chart, lab work & pertinent test results  History of Anesthesia Complications Negative for: history of anesthetic complications  Airway Mallampati: I       Dental no notable dental hx. (+) Dental Advisory Given   Pulmonary neg recent URI, Patient abstained from smoking.   breath sounds clear to auscultation       Cardiovascular negative cardio ROS  Rhythm:Regular     Neuro/Psych    GI/Hepatic ,GERD  ,,Transaminitis Recent Lap Chole   Endo/Other    Renal/GU      Musculoskeletal   Abdominal   Peds  Hematology  (+) Blood dyscrasia, anemia   Anesthesia Other Findings   Reproductive/Obstetrics                              Anesthesia Physical Anesthesia Plan  ASA: 3  Anesthesia Plan: MAC   Post-op Pain Management:    Induction: Intravenous  PONV Risk Score and Plan:   Airway Management Planned: Simple Face Mask  Additional Equipment:   Intra-op Plan:   Post-operative Plan:   Informed Consent:      Dental advisory given  Plan Discussed with: CRNA and Surgeon  Anesthesia Plan Comments:          Anesthesia Quick Evaluation

## 2023-11-12 NOTE — Progress Notes (Signed)
   11/12/23 1017  TOC Brief Assessment  Insurance and Status Reviewed  Patient has primary care physician Yes  Home environment has been reviewed resides in an apartment with spouse  Prior level of function: Independent  Prior/Current Home Services No current home services  Social Drivers of Health Review SDOH reviewed no interventions necessary  Readmission risk has been reviewed Yes  Transition of care needs no transition of care needs at this time

## 2023-11-12 NOTE — Progress Notes (Addendum)
 Progress Note   Subjective  Hospital day #3 Chief Complaint: Epigastric pain, cholecystitis status post lap cholecystectomy 11/11/2023 and elevated LFTs  Today, patient tells me that she had another episode of epigastric discomfort that was exactly the same as of the pain she had prior to coming in.  It occurred last night and woke her from her sleep.  Not necessarily after eating.  She is worried in regards to this ongoing discomfort.  She does have a regular diet tray in front of her but has not had very many bites of it.  Denies any new complaints or concerns.  No nausea or vomiting.   Objective   Vital signs in last 24 hours: Temp:  [97.8 F (36.6 C)-98.7 F (37.1 C)] 98.7 F (37.1 C) (09/18 0449) Pulse Rate:  [51-66] 54 (09/18 0449) Resp:  [11-19] 15 (09/18 0449) BP: (100-121)/(60-80) 113/70 (09/18 0449) SpO2:  [92 %-100 %] 97 % (09/18 0449) Last BM Date : 11/08/23 General: Arabic female in NAD Heart:  Regular rate and rhythm; no murmurs Lungs: Respirations even and unlabored, lungs CTA bilaterally Abdomen:  Soft, moderate epigastric TTP and nondistended. Normal bowel sounds. + Clean surgical incisions from recent lap chole Psych:  Cooperative. Normal mood and affect.  Intake/Output from previous day: 09/17 0701 - 09/18 0700 In: 1918.3 [P.O.:600; I.V.:1318.3] Out: 2100 [Urine:2100] Intake/Output this shift: Total I/O In: 3 [I.V.:3] Out: -   Lab Results: Recent Labs    11/10/23 0556 11/11/23 0444 11/12/23 0503  WBC 6.3 6.0 12.6*  HGB 11.1* 11.4* 11.8*  HCT 33.4* 34.1* 36.5  PLT 220 242 263   BMET Recent Labs    11/10/23 0556 11/11/23 0444 11/12/23 0503  NA 143 140 140  K 3.3* 4.0 3.8  CL 111 106 105  CO2 22 24 20*  GLUCOSE 94 100* 110*  BUN 6 6 5*  CREATININE 0.48 0.56 0.51  CALCIUM  7.5* 8.7* 9.1      Latest Ref Rng & Units 11/12/2023    5:03 AM 11/11/2023    4:44 AM 11/10/2023    5:56 AM  Hepatic Function  Total Protein 6.5 - 8.1 g/dL 6.4  5.8   5.3   Albumin 3.5 - 5.0 g/dL 3.9  3.7  3.5   AST 15 - 41 U/L 124  293  629   ALT 0 - 44 U/L 536  689  587   Alk Phosphatase 38 - 126 U/L 147  153  110   Total Bilirubin 0.0 - 1.2 mg/dL 0.5  0.8  1.3   Bilirubin, Direct 0.0 - 0.2 mg/dL 0.2   0.9      PT/INR Recent Labs    11/10/23 0653 11/12/23 0503  LABPROT 14.1 14.2  INR 1.0 1.0     Assessment / Plan:   This is a 33 year old Arabic female with a past medical history of reflux who presented to the ER with a complaint of right upper quadrant/epigastric pain found to have elevated LFTs and cholelithiasis on right upper quadrant ultrasound as well as CTA showing 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  Underwent lap cholecystectomy 11/11/2023, continues with epigastric discomfort today.  Impression: 1.  Right upper quadrant/epigastric abdominal pain: Started acutely at 4 PM on 11/09/2023 radiating around to the right upper quadrant into her right shoulder blade associated with nausea, no fever or chills, imaging with cholelithiasis, underwent lap cholecystectomy 11/11/2023, continues with epigastric pain and elevated LFTs that have trended down some during this  hospitalization; consider still possibility of gallbladder etiology +/- autoimmune cause of elevated liver enzymes +/- PUD versus gastritis with continued epigastric discomfort 2.  Elevated LFTs: See above 3.  Normocytic normochromic anemia: Hemoglobin trending back up, initially 12.4--> 11.1--> 11.8 today  Plan: 1.  Patient continues with epigastric discomfort and LFTs have come down some, but still remain elevated.  Some concern for gastric etiology for symptoms +/- choledocholithiasis.  Unfortunately IOC unable to be completed yesterday. 2.  Continue Pantoprazole  40 mg p.o. twice daily for now 3.  Plans for EGD/EUS tomorrow with Dr. Wilhelmenia.  Did discuss risks, benefits and limitations with the patient and she agrees to proceed. 4.  Patient can continue on  regular diet as tolerated today and will be n.p.o. at midnight. 5.  Also adding some liver labs today including ANA, ASMA, AMA and IgA 6.  Continue to trend CBC and CMP  Thank you for your kind consultation, we will continue to follow.  Pending results from procedure patient may be able to be discharged tomorrow afternoon.    LOS: 0 days   Delon Hendricks Failing  11/12/2023, 10:15 AM     Attending Physician's Attestation   I have taken an interval history, reviewed the chart and examined the patient.   Patient's history is such that the findings of cholecystitis.  She continues to have abdominal pain that may be related to post gallbladder symptoms versus rule out occult ulcer disease.  Will plan EGD tomorrow and endoscopic ultrasound to evaluate and ensure no evidence of micro choledocholithiasis (though her initial imaging was not showing evidence of choledocholithiasis or biliary dilation).  The risks of an EUS including intestinal perforation, bleeding, infection, aspiration, and medication effects were discussed as was the possibility it may not give a definitive diagnosis if a biopsy is performed.  When a biopsy of the pancreas is done as part of the EUS, there is an additional risk of pancreatitis at the rate of about 1-2%.  It was explained that procedure related pancreatitis is typically mild, although it can be severe and even life threatening, which is why we do not perform random pancreatic biopsies and only biopsy a lesion/area we feel is concerning enough to warrant the risk. All patient questions were answered to the best of my ability, and the patient agrees to the aforementioned plan of action with follow-up as indicated.  I agree with the Advanced Practitioner's note, impression, and recommendations with updates and my documentation as noted above.  The majority of the medical decision making/process, formulation of the impression/plan of action for the patient were performed by me  with substantive portion of this encounter (>50% time spent including complete performance of at least one of the key components of MDM, History, and/or Exam).   Aloha Wilhelmenia, MD [facility] Office # [phone]

## 2023-11-12 NOTE — Progress Notes (Signed)
 PROGRESS NOTE    Anne Whitaker  FMW:[id number] DOB: 1990-03-09 DOA: 11/09/2023 PCP: Patient, No Pcp Per   Brief Narrative: 33 year old with past medical history significant for reflux presenting with abdominal pain sudden onset epigastric area radiated to upper back.  She has had follow-up with GI in the past 2021 for abdominal discomfort at that time she had endoscopy and colonoscopy which were unremarkable.  She presented with transaminases, lipase was normal.  Ultrasound of the abdomen showed gallstone, no evidence of cholecystitis.  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     Assessment & Plan:   Principal Problem:   Abdominal pain Active Problems:   Elevated LFTs   Anemia   Biliary colic   Transaminitis   Cholelithiasis   1-Cholelithiasis  Biliary Cholic  Abdominal pain, gallstone, transaminases Evaluated by Sx and GI.  Underwent cholecystectomy 9/17 Post op diagnosis: acute on chronic cholecystitis.  Repeat LFT in am./ trending down.  Continue to have epigastric pain--plan for endoscopy, EUS by GI tomorrow.  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 Unlikely medial arcuate ligament compression of celiac axis  Symptoms related to acute on chronic Cholecystitis.   Pyuria -Urine culture , multiples bacterial morphotype's.  Received IV antibiotics pre op.  Repeated urine culture.   Normocytic  anemia -anemia panel : Folic acid 15, B12 715, iron 35, ferritin 39. She will benefit of iron supplementation at discharge  Hepatic steatosis, possible serohepatitis. Intra surgical bx obtain.   Estimated body mass index is 32.26 kg/m as calculated from the following:   Height as of this encounter: 5' 3 (1.6 m).   Weight as of this encounter: 82.6  kg.   DVT prophylaxis: SCD Code Status: Full code Family Communication:care discussed with patient Disposition Plan:  Status is: Observation The patient remains OBS appropriate and will d/c before 2 midnights.    Consultants:  General sx GI  Procedures:   Antimicrobials:    Subjective: She is feeling a little bit dizzy when she stands up.  She is still having some epigastric pain.  Not eating much.  Objective: Vitals:   11/11/23 1713 11/11/23 2105 11/12/23 0145 11/12/23 0449  BP: 106/63 111/64 100/60 113/70  Pulse: (!) 57 66 (!) 57 (!) 54  Resp: 16 16 15 15   Temp: 97.9 F (36.6 C) 98.3 F (36.8 C) 98.6 F (37 C) 98.7 F (37.1 C)  TempSrc: Oral Oral Oral Oral  SpO2: 95% 95% 96% 97%  Weight:      Height:        Intake/Output Summary (Last 24 hours) at 11/12/2023 1323 Last data filed at 11/12/2023 1000 Gross per 24 hour  Intake 1481.33 ml  Output 2500 ml  Net -1018.67 ml   Filed Weights   11/10/23 1508 11/11/23 0806  Weight: 82.6 kg 82.6 kg    Examination:  General exam: No acute distress Respiratory system: Clear to auscultation Cardiovascular system: S1-S2 regular rhythm and rate Gastrointestinal system: Bowel sounds present, soft, mild tender Central nervous system: Alert following commands Extremities: no edema    Data Reviewed: I have personally reviewed following labs and imaging studies  CBC: Recent Labs  Lab 11/09/23 2221 11/10/23 0556 11/11/23 0444 11/12/23 0503  WBC 8.3 6.3 6.0 12.6*  NEUTROABS  --  3.3  --   --  HGB 12.4 11.1* 11.4* 11.8*  HCT 37.6 33.4* 34.1* 36.5  MCV 89.7 90.8 90.2 93.1  PLT 266 220 242 263   Basic Metabolic Panel: Recent Labs  Lab 11/09/23 2221 11/10/23 0556 11/11/23 0444 11/12/23 0503  NA 139 143 140 140  K 3.9 3.3* 4.0 3.8  CL 101 111 106 105  CO2 27 22 24  20*  GLUCOSE 103* 94 100* 110*  BUN 7 6 6  5*  CREATININE 0.52 0.48 0.56 0.51  CALCIUM  10.0 7.5* 8.7* 9.1   GFR: Estimated Creatinine  Clearance: 100.9 mL/min (by C-G formula based on SCr of 0.51 mg/dL). Liver Function Tests: Recent Labs  Lab 11/09/23 2221 11/10/23 0429 11/10/23 0556 11/11/23 0444 11/12/23 0503  AST 261* 601* 629* 293* 124*  ALT 185* 531* 587* 689* 536*  ALKPHOS 117 116 110 153* 147*  BILITOT 0.9 1.2 1.3* 0.8 0.5  PROT 7.1 5.8* 5.3* 5.8* 6.4*  ALBUMIN 4.6 3.8 3.5 3.7 3.9   Recent Labs  Lab 11/09/23 2221  LIPASE 27   No results for input(s): AMMONIA in the last 168 hours. Coagulation Profile: Recent Labs  Lab 11/10/23 0653 11/12/23 0503  INR 1.0 1.0   Cardiac Enzymes: No results for input(s): CKTOTAL, CKMB, CKMBINDEX, TROPONINI in the last 168 hours. BNP (last 3 results) No results for input(s): PROBNP in the last 8760 hours. HbA1C: No results for input(s): HGBA1C in the last 72 hours. CBG: No results for input(s): GLUCAP in the last 168 hours. Lipid Profile: No results for input(s): CHOL, HDL, LDLCALC, TRIG, CHOLHDL, LDLDIRECT in the last 72 hours. Thyroid Function Tests: No results for input(s): TSH, T4TOTAL, FREET4, T3FREE, THYROIDAB in the last 72 hours. Anemia Panel: Recent Labs    11/12/23 0503  VITAMINB12 715  FOLATE 15.0  FERRITIN 39  TIBC 332  IRON 35  RETICCTPCT 2.1   Sepsis Labs: Recent Labs  Lab 11/10/23 0653  LATICACIDVEN 1.7    Recent Results (from the past 240 hours)  Urine Culture (for pregnant, neutropenic or urologic patients or patients with an indwelling urinary catheter)     Status: Abnormal   Collection Time: 11/10/23 12:46 AM   Specimen: Urine, Clean Catch  Result Value Ref Range Status   Specimen Description   Final    URINE, CLEAN CATCH Performed at [facility], [address], [place], [place] [address]    Special Requests   Final    NONE Performed at [facility], [address] [place], [place] [address]    Culture MULTIPLE SPECIES PRESENT, SUGGEST  RECOLLECTION (A)  Final   Report Status 11/11/2023 FINAL  Final         Radiology Studies: DG C-Arm 1-60 Min-No Report Result Date: 11/11/2023 Fluoroscopy was utilized by the requesting physician.  No radiographic interpretation.        Scheduled Meds:  cholecalciferol   1,000 Units Oral Daily   enoxaparin  (LOVENOX ) injection  40 mg Subcutaneous Q24H   pantoprazole   40 mg Oral BID   polycarbophil  625 mg Oral BID   polyethylene glycol  17 g Oral Daily   sodium chloride  flush  3 mL Intravenous Q12H   Continuous Infusions:  sodium chloride      sodium chloride  20 mL/hr at 11/12/23 1135   lactated ringers        LOS: 0 days    Time spent: 35 minutes    Khalee Mazo A Charrisse Masley, MD [facility]   If 7PM-7AM, please contact night-coverage [email or web address]  11/12/2023, 1:23  PM

## 2023-11-12 NOTE — Progress Notes (Signed)
 1 Day Post-Op  Subjective: Feeling ok overall.  Still with some epigastric pain that she says is similar to preop, but pain in her RUQ is gone.  Patient definitely had acute cholecystitis.  Had liquids yesterday.  Some nausea this am, but pain controlled.  ROS: See above, otherwise other systems negative  Objective: Vital signs in last 24 hours: Temp:  [97.8 F (36.6 C)-98.7 F (37.1 C)] 98.7 F (37.1 C) (09/18 0449) Pulse Rate:  [51-66] 54 (09/18 0449) Resp:  [11-19] 15 (09/18 0449) BP: (100-121)/(60-80) 113/70 (09/18 0449) SpO2:  [92 %-100 %] 97 % (09/18 0449) Last BM Date : 11/08/23  Intake/Output from previous day: 09/17 0701 - 09/18 0700 In: 1918.3 [P.O.:600; I.V.:1318.3] Out: 2100 [Urine:2100] Intake/Output this shift: Total I/O In: 3 [I.V.:3] Out: -   PE: Abd: soft, appropriately tender, but still with some tenderness in epigastrium, incision c/d/I with gauze and tegaderm, ND  Lab Results:  Recent Labs    11/11/23 0444 11/12/23 0503  WBC 6.0 12.6*  HGB 11.4* 11.8*  HCT 34.1* 36.5  PLT 242 263   BMET Recent Labs    11/11/23 0444 11/12/23 0503  NA 140 140  K 4.0 3.8  CL 106 105  CO2 24 20*  GLUCOSE 100* 110*  BUN 6 5*  CREATININE 0.56 0.51  CALCIUM  8.7* 9.1   PT/INR Recent Labs    11/10/23 0653 11/12/23 0503  LABPROT 14.1 14.2  INR 1.0 1.0   CMP     Component Value Date/Time   NA 140 11/12/2023 0503   K 3.8 11/12/2023 0503   CL 105 11/12/2023 0503   CO2 20 (L) 11/12/2023 0503   GLUCOSE 110 (H) 11/12/2023 0503   BUN 5 (L) 11/12/2023 0503   CREATININE 0.51 11/12/2023 0503   CALCIUM  9.1 11/12/2023 0503   PROT 6.4 (L) 11/12/2023 0503   ALBUMIN 3.9 11/12/2023 0503   AST 124 (H) 11/12/2023 0503   ALT 536 (H) 11/12/2023 0503   ALKPHOS 147 (H) 11/12/2023 0503   BILITOT 0.5 11/12/2023 0503   GFRNONAA >60 11/12/2023 0503   GFRAA >60 11/23/2019 1832   Lipase     Component Value Date/Time   LIPASE 27 11/09/2023 2221        Studies/Results: DG C-Arm 1-60 Min-No Report Result Date: 11/11/2023 Fluoroscopy was utilized by the requesting physician.  No radiographic interpretation.    Anti-infectives: Anti-infectives (From admission, onward)    Start     Dose/Rate Route Frequency Ordered Stop   11/11/23 1030  sodium chloride  0.9 % with cefTRIAXone  (ROCEPHIN ) ADS Med       Note to Pharmacy: Dartha Meckel: cabinet override      11/11/23 1030 11/11/23 2244   11/10/23 1512  sodium chloride  0.9 % with cefTRIAXone  (ROCEPHIN ) ADS Med       Note to Pharmacy: Rosalva Iha W: cabinet override      11/10/23 1512 11/11/23 0329   11/10/23 0900  cefTRIAXone  (ROCEPHIN ) 2 g in sodium chloride  0.9 % 100 mL IVPB  Status:  Discontinued        2 g 200 mL/hr over 30 Minutes Intravenous On call to O.R. 11/10/23 0848 11/10/23 1813        Assessment/Plan POD 1, s/p lap chole with liver bx for acute cholecystitis, Dr. Sheldon 9/17 -pre-op RUQ pain improved.  Still with some epigastric tenderness.  Unclear etiology.  Can treat with protonix  and have outpatient GI follow up if this doesn't resolve in the next couple  of weeks after post op period.  Some of this could still be post op in nature -regular diet as tolerates -if she tolerates her diet, she is surgically stable for DC home today from our standpoint -LFTs are improving -follow up and Rx for surgery have been completed   FEN - regular VTE - Lovenox  ID - none currently needed    LOS: 0 days    Burnard FORBES Banter , [facility] 11/12/2023, 9:36 AM Please see Amion for pager number during day hours 7:00am-4:30pm or 7:00am -11:30am on weekends

## 2023-11-13 ENCOUNTER — Observation Stay (HOSPITAL_BASED_OUTPATIENT_CLINIC_OR_DEPARTMENT_OTHER)

## 2023-11-13 ENCOUNTER — Encounter (HOSPITAL_COMMUNITY)
Admission: EM | Disposition: A | Discharge status: Home / Self Care | Payer: Self-pay | Source: Home / Self Care | Attending: Emergency Medicine

## 2023-11-13 ENCOUNTER — Encounter (HOSPITAL_COMMUNITY): Payer: Self-pay | Admitting: Internal Medicine

## 2023-11-13 ENCOUNTER — Other Ambulatory Visit (HOSPITAL_COMMUNITY): Payer: Self-pay

## 2023-11-13 ENCOUNTER — Observation Stay (HOSPITAL_COMMUNITY)

## 2023-11-13 DIAGNOSIS — R748 Abnormal levels of other serum enzymes: Secondary | ICD-10-CM | POA: Diagnosis not present

## 2023-11-13 DIAGNOSIS — K3189 Other diseases of stomach and duodenum: Secondary | ICD-10-CM

## 2023-11-13 DIAGNOSIS — K8689 Other specified diseases of pancreas: Secondary | ICD-10-CM

## 2023-11-13 HISTORY — PX: EUS: SHX5427

## 2023-11-13 HISTORY — PX: ESOPHAGOGASTRODUODENOSCOPY: SHX5428

## 2023-11-13 LAB — COMPREHENSIVE METABOLIC PANEL WITH GFR
ALT: 359 U/L — ABNORMAL HIGH (ref 0–44)
AST: 61 U/L — ABNORMAL HIGH (ref 15–41)
Albumin: 3.7 g/dL (ref 3.5–5.0)
Alkaline Phosphatase: 122 U/L (ref 38–126)
Anion gap: 12 (ref 5–15)
BUN: <5 mg/dL — ABNORMAL LOW (ref 6–20)
CO2: 23 mmol/L (ref 22–32)
Calcium: 8.4 mg/dL — ABNORMAL LOW (ref 8.9–10.3)
Chloride: 106 mmol/L (ref 98–111)
Creatinine, Ser: 0.5 mg/dL (ref 0.44–1.00)
GFR, Estimated: >60 mL/min (ref >60)
Glucose, Bld: 104 mg/dL — ABNORMAL HIGH (ref 70–99)
Potassium: 3.6 mmol/L (ref 3.5–5.1)
Sodium: 142 mmol/L (ref 135–145)
Total Bilirubin: 0.4 mg/dL (ref 0.0–1.2)
Total Protein: 5.9 g/dL — ABNORMAL LOW (ref 6.5–8.1)

## 2023-11-13 LAB — CBC
HCT: 33.4 % — ABNORMAL LOW (ref 36.0–46.0)
Hemoglobin: 11 g/dL — ABNORMAL LOW (ref 12.0–15.0)
MCH: 30.2 pg (ref 26.0–34.0)
MCHC: 32.9 g/dL (ref 30.0–36.0)
MCV: 91.8 fL (ref 80.0–100.0)
Platelets: 254 K/uL (ref 150–400)
RBC: 3.64 MIL/uL — ABNORMAL LOW (ref 3.87–5.11)
RDW: 13.2 % (ref 11.5–15.5)
WBC: 8.4 K/uL (ref 4.0–10.5)
nRBC: 0 % (ref 0.0–0.2)

## 2023-11-13 LAB — ANA W/REFLEX IF POSITIVE: Anti Nuclear Antibody (ANA): NEGATIVE

## 2023-11-13 LAB — IGA: IgA: 154 mg/dL (ref 87–352)

## 2023-11-13 LAB — SURGICAL PATHOLOGY

## 2023-11-13 SURGERY — EGD (ESOPHAGOGASTRODUODENOSCOPY)
Anesthesia: Monitor Anesthesia Care

## 2023-11-13 MED ORDER — PROPOFOL 10 MG/ML IV BOLUS
INTRAVENOUS | Status: DC | PRN
  Administered 2023-11-13: 20 mg via INTRAVENOUS
  Administered 2023-11-13: 30 mg via INTRAVENOUS
  Administered 2023-11-13: 50 mg via INTRAVENOUS

## 2023-11-13 MED ORDER — LIDOCAINE 2% (20 MG/ML) 5 ML SYRINGE
INTRAMUSCULAR | Status: DC | PRN
  Administered 2023-11-13: 60 mg via INTRAVENOUS
  Administered 2023-11-13: 40 mg via INTRAVENOUS

## 2023-11-13 MED ORDER — PROPOFOL 500 MG/50ML IV EMUL
INTRAVENOUS | Status: AC
  Filled 2023-11-13: qty 50

## 2023-11-13 MED ORDER — PANTOPRAZOLE SODIUM 40 MG PO TBEC
40.0000 mg | DELAYED_RELEASE_TABLET | Freq: Two times a day (BID) | ORAL | 0 refills | Status: AC
  Filled 2023-11-13: qty 60, 30d supply, fill #0

## 2023-11-13 MED ORDER — OXYCODONE HCL 5 MG PO TABS
ORAL_TABLET | ORAL | Status: AC
  Filled 2023-11-13: qty 1

## 2023-11-13 MED ORDER — CALCIUM POLYCARBOPHIL 625 MG PO TABS
625.0000 mg | ORAL_TABLET | Freq: Two times a day (BID) | ORAL | 0 refills | Status: AC
  Filled 2023-11-13: qty 60, 30d supply, fill #0

## 2023-11-13 MED ORDER — POLYETHYLENE GLYCOL 3350 17 GM/SCOOP PO POWD
17.0000 g | Freq: Every day | ORAL | 0 refills | Status: AC | PRN
  Filled 2023-11-13: qty 238, 14d supply, fill #0

## 2023-11-13 MED ORDER — PROPOFOL 500 MG/50ML IV EMUL
INTRAVENOUS | Status: DC | PRN
  Administered 2023-11-13: 125 ug/kg/min via INTRAVENOUS

## 2023-11-13 MED ORDER — SODIUM CHLORIDE 0.9 % IV SOLN
INTRAVENOUS | Status: AC | PRN
Start: 2023-11-13 — End: 2023-11-13
  Administered 2023-11-13: 500 mL via INTRAVENOUS

## 2023-11-13 NOTE — Anesthesia Procedure Notes (Signed)
 Procedure Name: MAC Date/Time: 11/13/2023 1:35 PM  Performed by: Dasie Nena PARAS, CRNAPre-anesthesia Checklist: Patient identified, Emergency Drugs available, Suction available, Patient being monitored and Timeout performed Oxygen Delivery Method: Simple face mask Preoxygenation: POM used. Placement Confirmation: positive ETCO2

## 2023-11-13 NOTE — Progress Notes (Signed)
 2 Days Post-Op  Subjective: Doing well post op, still with epigastric abdominal discomfort that is unchanged.  Objective: Vital signs in last 24 hours: Temp:  [98.6 F (37 C)-99 F (37.2 C)] 98.9 F (37.2 C) (09/19 0538) Pulse Rate:  [62-76] 76 (09/19 0538) Resp:  [16] 16 (09/19 0538) BP: (94-110)/(55-74) 110/74 (09/19 0538) SpO2:  [93 %-100 %] 93 % (09/19 0538) Last BM Date : 11/08/23  Intake/Output from previous day: 09/18 0701 - 09/19 0700 In: 1264.3 [P.O.:810; I.V.:354.3; IV Piggyback:100] Out: 900 [Urine:900] Intake/Output this shift: No intake/output data recorded.  PE: Abd: soft, appropriately tender, but still with some tenderness in epigastrium, incision c/d/I with gauze and tegaderm, ND  Lab Results:  Recent Labs    11/12/23 0503 11/13/23 0434  WBC 12.6* 8.4  HGB 11.8* 11.0*  HCT 36.5 33.4*  PLT 263 254   BMET Recent Labs    11/12/23 0503 11/13/23 0434  NA 140 142  K 3.8 3.6  CL 105 106  CO2 20* 23  GLUCOSE 110* 104*  BUN 5* <5*  CREATININE 0.51 0.50  CALCIUM  9.1 8.4*   PT/INR Recent Labs    11/12/23 0503  LABPROT 14.2  INR 1.0   CMP     Component Value Date/Time   NA 142 11/13/2023 0434   K 3.6 11/13/2023 0434   CL 106 11/13/2023 0434   CO2 23 11/13/2023 0434   GLUCOSE 104 (H) 11/13/2023 0434   BUN <5 (L) 11/13/2023 0434   CREATININE 0.50 11/13/2023 0434   CALCIUM  8.4 (L) 11/13/2023 0434   PROT 5.9 (L) 11/13/2023 0434   ALBUMIN 3.7 11/13/2023 0434   AST 61 (H) 11/13/2023 0434   ALT 359 (H) 11/13/2023 0434   ALKPHOS 122 11/13/2023 0434   BILITOT 0.4 11/13/2023 0434   GFRNONAA >60 11/13/2023 0434   GFRAA >60 11/23/2019 1832   Lipase     Component Value Date/Time   LIPASE 27 11/09/2023 2221       Studies/Results: DG C-Arm 1-60 Min-No Report Result Date: 11/11/2023 Fluoroscopy was utilized by the requesting physician.  No radiographic interpretation.    Anti-infectives: Anti-infectives (From admission, onward)     Start     Dose/Rate Route Frequency Ordered Stop   11/12/23 1730  cefTRIAXone  (ROCEPHIN ) 2 g in sodium chloride  0.9 % 100 mL IVPB        2 g 200 mL/hr over 30 Minutes Intravenous Every 24 hours 11/12/23 1636 11/15/23 1729   11/11/23 1030  sodium chloride  0.9 % with cefTRIAXone  (ROCEPHIN ) ADS Med       Note to Pharmacy: Dartha Meckel: cabinet override      11/11/23 1030 11/11/23 2244   11/10/23 1512  sodium chloride  0.9 % with cefTRIAXone  (ROCEPHIN ) ADS Med       Note to Pharmacy: Rosalva Iha W: cabinet override      11/10/23 1512 11/11/23 0329   11/10/23 0900  cefTRIAXone  (ROCEPHIN ) 2 g in sodium chloride  0.9 % 100 mL IVPB  Status:  Discontinued        2 g 200 mL/hr over 30 Minutes Intravenous On call to O.R. 11/10/23 0848 11/10/23 1813        Assessment/Plan POD 2, s/p lap chole with liver bx for acute cholecystitis, Dr. Sheldon 9/17 -pre-op RUQ pain improved.  Still with some epigastric tenderness.  Unclear etiology.  GI planning for EUS/EGD today -regular diet as tolerates from our standpoint -she is surgically stable for DC home from our standpoint after  further work up completed -LFTs are improving -follow up and Rx for surgery have been completed   FEN - regular VTE - Lovenox  ID - none currently needed    LOS: 0 days    Burnard FORBES Banter , [facility] 11/13/2023, 10:05 AM Please see Amion for pager number during day hours 7:00am-4:30pm or 7:00am -11:30am on weekends

## 2023-11-13 NOTE — Progress Notes (Signed)
 Discharge meds in a secure bag delivered to pt in room by this RN

## 2023-11-13 NOTE — Progress Notes (Signed)
 Discharge instructions discussed with patient, verbalized agreement and understanding

## 2023-11-13 NOTE — Transfer of Care (Signed)
 Immediate Anesthesia Transfer of Care Note  Patient: Anne Whitaker  Procedure(s) Performed: ULTRASOUND, UPPER GI TRACT, ENDOSCOPIC EGD (ESOPHAGOGASTRODUODENOSCOPY)  Patient Location: PACU and Endoscopy Unit  Anesthesia Type:MAC  Level of Consciousness: awake, alert , oriented, and patient cooperative  Airway & Oxygen Therapy: Patient Spontanous Breathing  Post-op Assessment: Report given to RN and Post -op Vital signs reviewed and stable  Post vital signs: Reviewed and stable  Last Vitals:  Vitals Value Taken Time  BP    Temp    Pulse 82 11/13/23 14:04  Resp 24 11/13/23 14:04  SpO2 98 % 11/13/23 14:04  Vitals shown include unfiled device data.  Last Pain:  Vitals:   11/13/23 1226  TempSrc: Temporal  PainSc: 10-Worst pain ever      Patients Stated Pain Goal: 4 (11/12/23 0603)  Complications: No notable events documented.

## 2023-11-13 NOTE — Op Note (Signed)
 [facility] Patient Name: Anne Whitaker Procedure Date: 11/13/2023 MRN: [id number] Attending MD: Aloha Finner , MD, [id number] Date of Birth: 09/11/1990 CSN: [id number] Age: 33 Admit Type: Inpatient Procedure:                Upper EUS Indications:              Suspected choledocholithiasis, Epigastric abdominal                            pain, Abdominal pain in the right upper quadrant Providers:                Aloha Finner, MD, Randall Lines, RN, Fairy Marina, Technician Referring MD:              Medicines:                Monitored Anesthesia Care Complications:            No immediate complications. Estimated Blood Loss:     Estimated blood loss was minimal. Procedure:                Pre-Anesthesia Assessment:                           - Prior to the procedure, a History and Physical                            was performed, and patient medications and                            allergies were reviewed. The patient's tolerance of                            previous anesthesia was also reviewed. The risks                            and benefits of the procedure and the sedation                            options and risks were discussed with the patient.                            All questions were answered, and informed consent                            was obtained. Prior Anticoagulants: The patient has                            taken Lovenox  (enoxaparin ), last dose was 1 day                            prior to procedure. ASA Grade Assessment: II - A  patient with mild systemic disease. After reviewing                            the risks and benefits, the patient was deemed in                            satisfactory condition to undergo the procedure.                           After obtaining informed consent, the endoscope was                            passed under direct vision.  Throughout the                            procedure, the patient's blood pressure, pulse, and                            oxygen saturations were monitored continuously. The                            [id number]-[id number] ([id number]) Olympus endoscope was introduced                            through the mouth, and advanced to the second part                            of duodenum. The [id number] ([id number]) Olympus                            ultrasound scope was introduced through the mouth,                            and advanced to the duodenum for ultrasound                            examination from the stomach and duodenum. The                            upper EUS was accomplished without difficulty. The                            patient tolerated the procedure. Scope In: Scope Out: Findings:      ENDOSCOPIC FINDING: :      No gross lesions were noted in the entire esophagus.      The Z-line was regular and was found 36 cm from the incisors.      Patchy mildly erythematous mucosa without bleeding was found in the       entire examined stomach. Biopsies were taken with a cold forceps for       histology and Helicobacter pylori testing.      No gross lesions were noted in the duodenal bulb, in the first portion       of the duodenum and in the second portion of the  duodenum. Biopsies were       taken with a cold forceps for histology.      The major papilla was normal.      ENDOSONOGRAPHIC FINDING: :      Pancreatic parenchymal abnormalities were noted in the entire pancreas.       These consisted of hyperechoic strands.      The diameter of the main pancreatic duct (MPD) measured:      - HOP 1.5 mm (head of pancreas)      - NOP 1.8 -> 1.4 mm (neck of pancreas)      - BOP 0.8 mm (body of the pancreas)      - TOP 0.5 mm (tail of the pancreas).      There was no sign of significant endosonographic abnormality in the       common bile duct (2.1 -> 4.0 mm) and in the common hepatic duct (4.9        mm). No stones, no biliary sludge, ducts of normal caliber and ducts       with regular contour were identified.      Endosonographic imaging of the ampulla showed no intramural       (subepithelial) lesion.      Endosonographic imaging in the visualized portion of the liver showed no       mass.      No malignant-appearing lymph nodes were visualized in the celiac region       (level 20), peripancreatic region and porta hepatis region.      The celiac region was visualized. Impression:               EGD impression:                           - No gross lesions in the entire esophagus. Z-line                            regular, 36 cm from the incisors.                           - Erythematous mucosa in the stomach. Biopsied.                           - No gross lesions in the duodenal bulb, in the                            first portion of the duodenum and in the second                            portion of the duodenum. Biopsied.                           - Normal major papilla.                           EUS impression:                           - Pancreatic parenchymal abnormalities consisting  of hyperechoic strands were noted in the entire                            pancreas.                           - Main pancreatic duct (MPD) diameter was measured.                            Endosonographically, the MPD had a normal                            appearance. Possible ansa pancreatic duct noted in                            head/genu transition.                           - There was no sign of significant pathology in the                            common bile duct and in the common hepatic duct.                            There was no evidence of micro choledocholithiasis.                           - No malignant-appearing lymph nodes were                            visualized in the celiac region (level 20),                            peripancreatic region  and porta hepatis region. Moderate Sedation:      Not Applicable - Patient had care per Anesthesia. Recommendation:           - The patient will be observed post-procedure,                            until all discharge criteria are met.                           - Return patient to hospital ward for ongoing care.                           - Advance diet as tolerated.                           - Observe patient's clinical course.                           - Trend LFTs while in-house and at surgery                            follow-up.                           -  Basic liver serological workup still pending at                            this time and will return over the course of the                            coming week. Follow-up can be arranged if liver                            biochemical testing does not resolve.                           - Continue current PPI dosing twice daily.                           - Follow-up pathology.                           - No further inpatient GI workup planned at this                            time.                           - The findings and recommendations were discussed                            with the patient.                           - The findings and recommendations were discussed                            with the referring physician. Procedure Code(s):        --- Professional ---                           [phone], Esophagogastroduodenoscopy, flexible,                            transoral; with endoscopic ultrasound examination                            limited to the esophagus, stomach or duodenum, and                            adjacent structures                           43239, Esophagogastroduodenoscopy, flexible,                            transoral; with biopsy, single or multiple Diagnosis Code(s):        --- Professional ---  K31.89, Other diseases of stomach and duodenum                            K86.9, Disease of pancreas, unspecified                           I89.9, Noninfective disorder of lymphatic vessels                            and lymph nodes, unspecified                           R10.13, Epigastric pain                           R10.11, Right upper quadrant pain CPT copyright 2022 [employer]. All rights reserved. The codes documented in this report are preliminary and upon coder review may  be revised to meet current compliance requirements. Aloha Finner, MD 11/13/2023 2:08:03 PM Number of Addenda: 0

## 2023-11-13 NOTE — Discharge Summary (Signed)
 Physician Discharge Summary   Patient: Anne Whitaker MRN: [id number] DOB: 12/03/1990  Admit date:     11/09/2023  Discharge date: 11/13/23  Discharge Physician: Owen DELENA Lore   PCP: Patient, No Pcp Per   Recommendations at discharge:    Needs follow up with GI for further evaluation of transaminases, and liver bx results.  Needs to follow up with Sx for post op care.   Discharge Diagnoses: Principal Problem:   Abdominal pain Active Problems:   Elevated LFTs   Anemia   Biliary colic   Transaminitis   Cholelithiasis   Elevated liver enzymes   RUQ pain   History of cholecystectomy   Cholecystitis, acute  Resolved Problems:   * No resolved hospital problems. *  Hospital Course: 33 year old with past medical history significant for reflux presenting with abdominal pain sudden onset epigastric area radiated to upper back. She has had follow-up with GI in the past 2021 for abdominal discomfort at that time she had endoscopy and colonoscopy which were unremarkable. She presented with transaminases, lipase was normal. Ultrasound of the abdomen showed gallstone, no evidence of cholecystitis. 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   Assessment and Plan: 1-Cholelithiasis  Biliary Cholic  Abdominal pain, gallstone, transaminases Evaluated by Sx and GI.  Underwent cholecystectomy 9/17 Post op diagnosis: acute on chronic cholecystitis.  Repeat LFT in am./ trending down.  Continue to have epigastric pain--underwent endoscopy, EUS by GI today, only showed gastritis. Needs PPI.  IgA, anti-smooth muscle antibody, mitochondrial antibody and ANA order pending.  Denies pain, she has been drinking some. She feels she can go home tonight.    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 Unlikely medial arcuate ligament compression of celiac axis   Symptoms related to acute on chronic Cholecystitis.    Pyuria -Urine culture , multiples bacterial morphotype's.  Received IV antibiotics pre op.  Repeated urine culture. Insignificant growth. Stop antibiotics.    Normocytic  anemia -anemia panel : Folic acid 15, B12 715, iron 35, ferritin 39. She will benefit of iron supplementation at discharge   Hepatic steatosis, possible serohepatitis. Intra surgical bx obtain.  FU labs and further work up with GI out patient.    Estimated body mass index is 32.26 kg/m as calculated from the following:   Height as of this encounter: 5' 3 (1.6 m).   Weight as of this encounter: 82.6 kg.         Consultants: GI, Sx Procedures performed: none Disposition: Home Diet recommendation:  Discharge Diet Orders (From admission, onward)     Start     Ordered   11/13/23 0000  Diet - low sodium heart healthy        11/13/23 1616   11/11/23 0000  Diet - low sodium heart healthy       Comments: Start with a bland diet such as soups, liquids, starchy foods, low fat foods, etc. the first few days at home. Gradually advance to a solid, low-fat, high fiber diet by the end of the first week at home.   Add a fiber supplement to your diet (Metamucil, etc) If you feel full, bloated, or constipated, stay on a full liquid or pureed/blenderized diet for a few days until you feel better and are no longer constipated.   11/11/23 1518           Cardiac diet  DISCHARGE MEDICATION: Allergies as of 11/13/2023   No Known Allergies      Medication List     TAKE these medications    cholecalciferol  25 MCG (1000 UNIT) tablet Commonly known as: VITAMIN D3 Take 1,000 Units by mouth daily.   ondansetron  4 MG tablet Commonly known as: ZOFRAN  Take 1 tablet (4 mg total) by mouth every 8 (eight) hours as needed for nausea.   oxyCODONE -acetaminophen  5-325 MG tablet Commonly known as: Percocet Take 1 tablet by mouth every 6 (six) hours as needed for  severe pain (pain score 7-10). Can increase to 2 pills at a time if needed   pantoprazole  40 MG tablet Commonly known as: PROTONIX  Take 1 tablet (40 mg total) by mouth 2 (two) times daily.   polycarbophil 625 MG tablet Commonly known as: FIBERCON Take 1 tablet (625 mg total) by mouth 2 (two) times daily.   polyethylene glycol powder 17 GM/SCOOP powder Commonly known as: GLYCOLAX /MIRALAX  Take 17 g by mouth daily as needed. Dissolve 1 capful (17g) in 4-8 ounces of liquid and take by mouth daily.               Discharge Care Instructions  (From admission, onward)           Start     Ordered   11/11/23 0000  Discharge wound care:       Comments: It is good for closed incisions and even open wounds to be washed every day.  Shower every day.  Short baths are fine.  Wash the incisions and wounds clean with soap & water .    You may leave closed incisions open to air if it is dry.   You may cover the incision with clean gauze & replace it after your daily shower for comfort.  TEGADERM:  You have clear gauze band-aid dressings over your closed incision(s).  Remove the dressings 2 days after surgery.   11/11/23 1518            Follow-up Information     [facility] Follow up on 12/08/2023.   Specialty: General Surgery Why: 11:15am, Arrive 30 minutes prior to your appointment time, Please bring your insurance card and photo ID Contact information: [address] [place]  [address] [phone]               Discharge Exam: Anne Whitaker   11/10/23 1508 11/11/23 0806  Weight: 82.6 kg 82.6 kg   General; NAD  Condition at discharge: stable  The results of significant diagnostics from this hospitalization (including imaging, microbiology, ancillary and laboratory) are listed below for reference.   Imaging Studies: DG C-Arm 1-60 Min-No Report Result Date: 11/11/2023 Fluoroscopy was utilized by the requesting  physician.  No radiographic interpretation.   CT Angio Chest/Abd/Pel for Dissection W and/or W/WO Result Date: 11/10/2023 CLINICAL DATA:  Chest and abdominal pain EXAM: CT ANGIOGRAPHY CHEST, ABDOMEN AND PELVIS TECHNIQUE: Non-contrast CT of the chest was initially obtained. Multidetector CT imaging through the chest, abdomen and pelvis was performed using the standard protocol during bolus administration of intravenous contrast. Multiplanar reconstructed images and MIPs were obtained and reviewed to evaluate the vascular anatomy. RADIATION DOSE REDUCTION: This exam was performed according to the departmental dose-optimization program which includes automated exposure control, adjustment of the mA and/or kV according to patient size and/or use of iterative reconstruction technique. CONTRAST:  OMNIPAQUE  IOHEXOL  350 MG/ML SOLN COMPARISON:  None Available. FINDINGS: CTA CHEST FINDINGS Cardiovascular: Initial  precontrast images show no acute abnormality. Post-contrast images demonstrate the thoracic aorta to have no aneurysmal dilatation or dissection. The heart is at the upper limits of normal in size. Pulmonary artery shows a normal branching pattern without intraluminal filling defect. Mediastinum/Nodes: Thoracic inlet is within normal limits. No hilar or mediastinal adenopathy is noted. The esophagus as visualized is within normal limits. Lungs/Pleura: Lungs are well aerated bilaterally. No focal infiltrate or sizable effusion is seen. No parenchymal nodules are noted. Musculoskeletal: No chest wall abnormality. No acute or significant osseous findings. Review of the MIP images confirms the above findings. CTA ABDOMEN AND PELVIS FINDINGS VASCULAR Aorta: Abdominal aorta demonstrates no atherosclerotic calcification. No aneurysmal dilatation or dissection is seen. Celiac: Median arcuate ligament compression is noted. Mild poststenotic dilatation is seen. SMA: Patent without evidence of aneurysm, dissection,  vasculitis or significant stenosis. Renals: Both renal arteries are patent without evidence of aneurysm, dissection, vasculitis, fibromuscular dysplasia or significant stenosis. Dual renal arteries are noted on the right. IMA: Patent without evidence of aneurysm, dissection, vasculitis or significant stenosis. Inflow: Iliacs are well visualized and within normal limits. Veins: No specific venous abnormality is noted. Review of the MIP images confirms the above findings. NON-VASCULAR Hepatobiliary: No focal liver abnormality is seen. No gallstones, gallbladder wall thickening, or biliary dilatation. Pancreas: Unremarkable. No pancreatic ductal dilatation or surrounding inflammatory changes. Spleen: Normal in size without focal abnormality. Adrenals/Urinary Tract: Adrenal glands are within normal limits. Kidneys are well visualized bilaterally. No renal calculi or obstructive changes are noted. The bladder is partially distended. Stomach/Bowel: No obstructive or inflammatory changes of the colon are noted. The appendix is within normal limits. Small bowel and stomach are unremarkable. Lymphatic: No lymphadenopathy is noted. Reproductive: Uterus and bilateral adnexa are unremarkable. Other: No abdominal wall hernia or abnormality. No abdominopelvic ascites. Musculoskeletal: No acute or significant osseous findings. Review of the MIP images confirms the above findings. IMPRESSION: CTA of the chest: No evidence of pulmonary emboli. No acute aortic abnormality is seen. No acute abnormality seen. CTA of the abdomen and pelvis: No acute aortic abnormality is noted. 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 No other focal abnormality is noted. Electronically Signed   By: Oneil Devonshire M.D.   On: 11/10/2023 03:12   US  Abdomen Limited RUQ (LIVER/GB) Result Date: 11/10/2023 CLINICAL DATA:  151471 RUQ pain 151471 EXAM: ULTRASOUND ABDOMEN LIMITED RIGHT UPPER QUADRANT COMPARISON:  None Available.  FINDINGS: Gallbladder: Subcentimeter calcified gallstone within the gallbladder lumen. No gallbladder wall thickening or pericholecystic fluid visualized. No sonographic Murphy sign noted by sonographer. Common bile duct: Diameter: 2 mm Liver: No focal lesion identified. Increased parenchymal echogenicity. Portal vein is patent on color Doppler imaging with normal direction of blood flow towards the liver. Other: None. IMPRESSION: 1. Cholelithiasis with no acute cholecystitis. 2. Hepatic steatosis. Please note limited evaluation for focal hepatic masses in a patient with hepatic steatosis due to decreased penetration of the acoustic ultrasound waves. Electronically Signed   By: Morgane  Naveau M.D.   On: 11/10/2023 01:48    Microbiology: Results for orders placed or performed during the hospital encounter of 11/09/23  Urine Culture (for pregnant, neutropenic or urologic patients or patients with an indwelling urinary catheter)     Status: Abnormal   Collection Time: 11/10/23 12:46 AM   Specimen: Urine, Clean Catch  Result Value Ref Range Status   Specimen Description   Final    URINE, CLEAN CATCH Performed at [facility], [address] [place], [place] [address]    Special Requests   Final    NONE Performed at [facility], [address], [place], [place] [address]    Culture MULTIPLE SPECIES PRESENT, SUGGEST RECOLLECTION (A)  Final   Report Status 11/11/2023 FINAL  Final  Urine Culture (for pregnant, neutropenic or urologic patients or patients with an indwelling urinary catheter)     Status: Abnormal (Preliminary result)   Collection Time: 11/12/23  5:17 AM   Specimen: Urine, Clean Catch  Result Value Ref Range Status   Specimen Description   Final    URINE, CLEAN CATCH Performed at [facility], [address] [place], [place] [address]    Special Requests   Final    NONE Performed at [facility], [address], [place], [place] [address]    Culture (A)  Final    20,000 COLONIES/mL ENTEROCOCCUS FAECALIS CULTURE REINCUBATED FOR BETTER GROWTH Performed at [facility], [address], [place], [place] [address]    Report Status PENDING  Incomplete    Labs: CBC: Recent Labs  Lab 11/09/23 2221 11/10/23 0556 11/11/23 0444 11/12/23 0503 11/13/23 0434  WBC 8.3 6.3 6.0 12.6* 8.4  NEUTROABS  --  3.3  --   --   --   HGB 12.4 11.1* 11.4* 11.8* 11.0*  HCT 37.6 33.4* 34.1* 36.5 33.4*  MCV 89.7 90.8 90.2 93.1 91.8  PLT 266 220 242 263 254   Basic Metabolic Panel: Recent Labs  Lab 11/09/23 2221 11/10/23 0556 11/11/23 0444 11/12/23 0503 11/13/23 0434  NA 139 143 140 140 142  K 3.9 3.3* 4.0 3.8 3.6  CL 101 111 106 105 106  CO2 27 22 24  20* 23  GLUCOSE 103* 94 100* 110* 104*  BUN 7 6 6  5* <5*  CREATININE 0.52 0.48 0.56 0.51 0.50  CALCIUM  10.0 7.5* 8.7* 9.1 8.4*   Liver Function Tests: Recent Labs  Lab 11/10/23 0429 11/10/23 0556 11/11/23 0444 11/12/23 0503 11/13/23 0434  AST 601* 629* 293* 124* 61*  ALT 531* 587* 689* 536* 359*  ALKPHOS 116 110 153* 147* 122  BILITOT 1.2 1.3* 0.8 0.5 0.4  PROT 5.8* 5.3* 5.8* 6.4* 5.9*  ALBUMIN 3.8 3.5 3.7 3.9 3.7   CBG: No results for input(s): GLUCAP in the last 168 hours.  Discharge time spent: greater than 30 minutes.  Signed: Owen DELENA Lore, MD [facility] 11/13/2023

## 2023-11-13 NOTE — Progress Notes (Addendum)
 Progress Note   Subjective  Hospital day #4 Chief Complaint: Epigastric pain, cholecystitis status post lap cholecystectomy 11/11/2023 with elevated LFTs  Continues with epigastric discomfort overnight that woke her from her sleep.  The only thing that helps is IV pain meds.  Also some nausea but no vomiting.  Patient aware of plans for procedure today.  No new complaints or concerns.   Objective   Vital signs in last 24 hours: Temp:  [98.6 F (37 C)-99 F (37.2 C)] 98.9 F (37.2 C) (09/19 0538) Pulse Rate:  [62-76] 76 (09/19 0538) Resp:  [16] 16 (09/19 0538) BP: (94-110)/(55-74) 110/74 (09/19 0538) SpO2:  [93 %-100 %] 93 % (09/19 0538) Last BM Date : 11/08/23 General: Arabic female in NAD Heart:  Regular rate and rhythm; no murmurs Lungs: Respirations even and unlabored, lungs CTA bilaterally Abdomen:  Soft, moderate epigastric TTP and nondistended. Normal bowel sounds.+ Clean incisions from recent lap chole Psych:  Cooperative. Normal mood and affect.  Intake/Output from previous day: 09/18 0701 - 09/19 0700 In: 1264.3 [P.O.:810; I.V.:354.3; IV Piggyback:100] Out: 900 [Urine:900]   Lab Results: Recent Labs    11/11/23 0444 11/12/23 0503 11/13/23 0434  WBC 6.0 12.6* 8.4  HGB 11.4* 11.8* 11.0*  HCT 34.1* 36.5 33.4*  PLT 242 263 254   BMET Recent Labs    11/11/23 0444 11/12/23 0503 11/13/23 0434  NA 140 140 142  K 4.0 3.8 3.6  CL 106 105 106  CO2 24 20* 23  GLUCOSE 100* 110* 104*  BUN 6 5* <5*  CREATININE 0.56 0.51 0.50  CALCIUM  8.7* 9.1 8.4*   LFT Recent Labs    11/12/23 0503 11/13/23 0434  PROT 6.4* 5.9*  ALBUMIN 3.9 3.7  AST 124* 61*  ALT 536* 359*  ALKPHOS 147* 122  BILITOT 0.5 0.4  BILIDIR 0.2  --   IBILI 0.3  --    PT/INR Recent Labs    11/12/23 0503  LABPROT 14.2  INR 1.0    Assessment / Plan:   33 year old Arabic female with a past medical history of reflux who presented to the ER with a complaint of right upper  quadrant/epigastric pain found to have elevated LFTs and cholelithiasis on right upper quadrant ultrasound as well as CTA showing 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 Underwent lap cholecystectomy 11/11/2023, continues with epigastric discomfort today.   Impression: 1.  Right upper quadrant/epigastric abdominal pain: Started acutely at 4 PM and 11/09/2023 rating into the right upper quadrant or right shoulder blade with nausea, imaging with cholelithiasis and underwent lap chole 11/11/2023, continues with epigastric pain over the past 48 hours and elevated LFTs which are trending down some, additional liver serologies are still pending including ANA, ASMA and AMA, IgG normal; consider still relation to gallbladder +/- autoimmune cause of elevated liver enzymes +/- PUD +/- microlithiasis/choledocholithiasis 2.  Elevated LFTs: See above 3.  Normocytic normochromic anemia: Stable  Plan: 1.  Plans for EUS/EGD today.  This is scheduled for around 1:00, patient will be collected around 12 from her room.  Discussed this with her. 2.  Continue current supportive measures 3.  Please await further recommendations from Dr. Wilhelmenia after time of procedure.  Patient will remain n.p.o. until then  Thank you for your kind consultation.   LOS: 0 days   Delon Hendricks Failing  11/13/2023, 10:04 AM     Attending Physician's Attestation   I have taken an interval history, reviewed the chart and examined  the patient.   Will plan endoscopy with ultrasound today, pending anesthesia availability.  Will rule out PUD as well as anything that would look like a retained choledocholith (pretest concern lower since her bile duct was normal sized on her last imaging study).  The risks of an EUS including intestinal perforation, bleeding, infection, aspiration, and medication effects were discussed as was the possibility it may not give a definitive diagnosis if a biopsy is performed.  When a biopsy  of the pancreas is done as part of the EUS, there is an additional risk of pancreatitis at the rate of about 1-2%.  It was explained that procedure related pancreatitis is typically mild, although it can be severe and even life threatening, which is why we do not perform random pancreatic biopsies and only biopsy a lesion/area we feel is concerning enough to warrant the risk.  The risks and benefits of endoscopic evaluation were discussed with the patient; these include but are not limited to the risk of perforation, infection, bleeding, missed lesions, lack of diagnosis, severe illness requiring hospitalization, as well as anesthesia and sedation related illnesses.  The patient and/or family is agreeable to proceed.    I agree with the Advanced Practitioner's note, impression, and recommendations with updates and my documentation as noted above.  The majority of the medical decision making/process, formulation of the impression/plan of action for the patient were performed by me with substantive portion of this encounter (>50% time spent including complete performance of at least one of the key components of MDM, History, and/or Exam).   Aloha Finner, MD [facility] Advanced Endoscopy Office # [phone]

## 2023-11-13 NOTE — Anesthesia Postprocedure Evaluation (Signed)
 Anesthesia Post Note  Patient: Anne Whitaker  Procedure(s) Performed: ULTRASOUND, UPPER GI TRACT, ENDOSCOPIC EGD (ESOPHAGOGASTRODUODENOSCOPY)     Patient location during evaluation: Endoscopy Anesthesia Type: MAC Level of consciousness: patient cooperative and awake Pain management: satisfactory to patient Respiratory status: spontaneous breathing Cardiovascular status: blood pressure returned to baseline Postop Assessment: no apparent nausea or vomiting Anesthetic complications: no Comments: Mild abdominal pain that improved with 5 mg of PO Oxycodone .   No notable events documented.  Last Vitals:  Vitals:   11/13/23 1428 11/13/23 1446  BP: 125/76 115/70  Pulse: (!) 129 (!) 59  Resp: (!) 22 18  Temp:  36.9 C  SpO2: 96% 98%    Last Pain:  Vitals:   11/13/23 1510  TempSrc:   PainSc: 4                  [name]

## 2023-11-15 LAB — MITOCHONDRIAL ANTIBODIES: Mitochondrial M2 Ab, IgG: <20 U (ref 0.0–20.0)

## 2023-11-15 LAB — URINE CULTURE: Culture: 20000 — AB

## 2023-11-15 LAB — ANTI-SMOOTH MUSCLE ANTIBODY, IGG: F-Actin IgG: 4 U (ref 0–19)

## 2023-11-16 ENCOUNTER — Encounter (HOSPITAL_COMMUNITY): Payer: Self-pay | Admitting: Gastroenterology

## 2023-11-16 LAB — SURGICAL PATHOLOGY

## 2023-11-17 ENCOUNTER — Ambulatory Visit: Payer: Self-pay | Admitting: Gastroenterology

## 2023-11-30 ENCOUNTER — Other Ambulatory Visit

## 2023-11-30 ENCOUNTER — Ambulatory Visit: Admitting: Gastroenterology

## 2023-11-30 ENCOUNTER — Encounter: Payer: Self-pay | Admitting: Gastroenterology

## 2023-11-30 VITALS — BP 96/60 | HR 82 | Ht 63.0 in | Wt 180.4 lb

## 2023-11-30 DIAGNOSIS — R7989 Other specified abnormal findings of blood chemistry: Secondary | ICD-10-CM

## 2023-11-30 DIAGNOSIS — E559 Vitamin D deficiency, unspecified: Secondary | ICD-10-CM | POA: Diagnosis not present

## 2023-11-30 DIAGNOSIS — K760 Fatty (change of) liver, not elsewhere classified: Secondary | ICD-10-CM

## 2023-11-30 DIAGNOSIS — D649 Anemia, unspecified: Secondary | ICD-10-CM | POA: Diagnosis not present

## 2023-11-30 DIAGNOSIS — Z9049 Acquired absence of other specified parts of digestive tract: Secondary | ICD-10-CM

## 2023-11-30 LAB — CBC WITH DIFFERENTIAL/PLATELET
Basophils Absolute: 0.1 K/uL (ref 0.0–0.1)
Basophils Relative: 0.8 % (ref 0.0–3.0)
Eosinophils Absolute: 0.1 K/uL (ref 0.0–0.7)
Eosinophils Relative: 1.2 % (ref 0.0–5.0)
HCT: 37.3 % (ref 36.0–46.0)
Hemoglobin: 13 g/dL (ref 12.0–15.0)
Lymphocytes Relative: 41 % (ref 12.0–46.0)
Lymphs Abs: 2.8 K/uL (ref 0.7–4.0)
MCHC: 34.9 g/dL (ref 30.0–36.0)
MCV: 87 fl (ref 78.0–100.0)
Monocytes Absolute: 0.5 K/uL (ref 0.1–1.0)
Monocytes Relative: 6.7 % (ref 3.0–12.0)
Neutro Abs: 3.4 K/uL (ref 1.4–7.7)
Neutrophils Relative %: 50.3 % (ref 43.0–77.0)
Platelets: 326 K/uL (ref 150.0–400.0)
RBC: 4.28 Mil/uL (ref 3.87–5.11)
RDW: 13.5 % (ref 11.5–15.5)
WBC: 6.8 K/uL (ref 4.0–10.5)

## 2023-11-30 LAB — HEPATIC FUNCTION PANEL
ALT: 16 U/L (ref 0–35)
AST: 11 U/L (ref 0–37)
Albumin: 4.6 g/dL (ref 3.5–5.2)
Alkaline Phosphatase: 72 U/L (ref 39–117)
Bilirubin, Direct: 0.1 mg/dL (ref 0.0–0.3)
Total Bilirubin: 0.4 mg/dL (ref 0.2–1.2)
Total Protein: 7.3 g/dL (ref 6.0–8.3)

## 2023-11-30 LAB — LIPASE: Lipase: 22 U/L (ref 11.0–59.0)

## 2023-11-30 LAB — VITAMIN D 25 HYDROXY (VIT D DEFICIENCY, FRACTURES): VITD: 20.59 ng/mL — ABNORMAL LOW (ref 30.00–100.00)

## 2023-11-30 NOTE — Patient Instructions (Addendum)
 Recommend high fiber diet Drink plenty of fluids   Continue Pantoprazole  40 mg twice daily.  Your provider has requested that you go to the basement level for lab work before leaving today. Press B on the elevator. The lab is located at the first door on the left as you exit the elevator.  _______________________________________________________  If your blood pressure at your visit was 140/90 or greater, please contact your primary care physician to follow up on this.  _______________________________________________________  If you are age 72 or older, your body mass index should be between 23-30. Your Body mass index is 31.95 kg/m. If this is out of the aforementioned range listed, please consider follow up with your Primary Care Provider.  If you are age 62 or younger, your body mass index should be between 19-25. Your Body mass index is 31.95 kg/m. If this is out of the aformentioned range listed, please consider follow up with your Primary Care Provider.   ________________________________________________________  The [facility] providers would like to encourage you to use MYCHART to communicate with providers for non-urgent requests or questions.  Due to long hold times on the telephone, sending your provider a message by [facility] may be a faster and more efficient way to get a response.  Please allow 48 business hours for a response.  Please remember that this is for non-urgent requests.  _______________________________________________________  [facility] is using a team-based approach to care.  Your team is made up of your doctor and two to three APPS. Our APPS (Nurse Practitioners and Physician Assistants) work with your physician to ensure care continuity for you. They are fully qualified to address your health concerns and develop a treatment plan. They communicate directly with your gastroenterologist to care for you. Seeing the Advanced Practice Practitioners on your  physician's team can help you by facilitating care more promptly, often allowing for earlier appointments, access to diagnostic testing, procedures, and other specialty referrals.   Thank you for trusting me with your gastrointestinal care. Deanna May, FNP-C

## 2023-11-30 NOTE — Progress Notes (Signed)
 Chief Complaint:hospital follow-up  Primary GI Doctor:Dr. San  HPI:  [name] is a 33 year old Arabic female with a past medical history of reflux who presents for follow-up visit after recent hospitalization for abdominal pain and elevated LFTs.  presented to the ER with a complaint of abdominal pain and elevated LFTs.   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 Discussed that her symptoms were much more consistent with biliary disease with significant elevation in LFTs. Given that she had gallstones with her symptoms recommend proceeding to the OR for lap chole with IOC.  Patient continued with abdominal pain post surgery on 9/17 11/13/23 Upper with EUS with Dr. Wilhelmenia EGD impression: - No gross lesions in the entire esophagus. Z- line regular, 36 cm from the incisors. - Erythematous mucosa in the stomach. Biopsied. - No gross lesions in the duodenal bulb, in the first portion of the duodenum and in the second portion of the duodenum. Biopsied. - Normal major papilla.  EUS impression: - Pancreatic parenchymal abnormalities consisting of hyperechoic strands were noted in the entire pancreas. - Main pancreatic duct ( MPD) diameter was measured. Endosonographically, the MPD had a normal appearance. Possible ansa pancreatic duct noted in head/ genu transition. - There was no sign of significant pathology in the common bile duct and in  the common hepatic duct. There was no evidence of micro choledocholithiasis. - No malignant- appearing lymph nodes were visualized in the celiac region ( level 20) ,peripancreatic region and porta hepatis region. Bx: The stomach biopsies show no evidence of H. pylori infection or precancerous changes. The duodenal biopsies showed no evidence of celiac disease or precancerous changes.   Interval History   Patient presents for hospital follow-up. Patient reports she has intermittent RUQ pain that still occurs daily and will last about couple days at a time. She reports the pain is worse with eating. She rates it 6-7/10. She rates this has improved since she has been in the hospital.  She has intermittent nausea with vomiting. She reports sometimes it is food and other times just white mucus. No history of post nasal drip or allergies. No fever or chills.  She notes her bowels fluctuate between diarrhea and constipation. No blood in stool. She reports the PPI therapy does help with the pain. She avoids spicy greasy foods. No alcohol. Nonsmoker.   She reports she feels tired all the time, no energy. She reports this makes it hard to be active. She reports history of anemia. She has heavy menstrual cycle 5-6 days.   Patient's family history: no GI issues, stomach CA in father, head tumor on mothers side  GI history: 02/13/2020 EGD done for generalized abdominal pain, nausea and vomiting with gastritis; biopsies showed H. pylori gastritis and patient treated with quadruple therapy 02/13/2020 colonoscopy done for generalized abdominal pain pain in the left lower quadrant and hematochezia with constipation was normal with nonbleeding internal hemorrhoids  Wt Readings from Last 3 Encounters:  11/30/23 180 lb 6 oz (81.8 kg)  11/11/23 182 lb 1.6 oz (82.6 kg)  12/16/21 184 lb (83.5 kg)    Past Medical History:  Diagnosis Date   GERD (gastroesophageal reflux disease)    Left shoulder pain     Past  Surgical History:  Procedure Laterality Date   CERVICAL CERCLAGE N/A 08/06/2021   Procedure: CERCLAGE CERVICAL;  Surgeon: Lequita Evalene LABOR, MD;  Location: [facility];  Service: Gynecology;  Laterality: N/A;   COLONOSCOPY  02/13/2020   ESOPHAGOGASTRODUODENOSCOPY N/A 11/13/2023   Procedure: EGD (ESOPHAGOGASTRODUODENOSCOPY);  Surgeon: Wilhelmenia Aloha Raddle., MD;  Location: [facility];  Service: Gastroenterology;  Laterality: N/A;   EUS N/A 11/13/2023   Procedure: ULTRASOUND, UPPER GI TRACT, ENDOSCOPIC;  Surgeon: Wilhelmenia Aloha Raddle., MD;  Location: [facility];  Service: Gastroenterology;  Laterality: N/A;   LAPAROSCOPIC CHOLECYSTECTOMY SINGLE PORT N/A 11/11/2023   Procedure: LAPAROSCOPIC CHOLECYSTECTOMY SINGLE SITE;  Surgeon: Sheldon Standing, MD;  Location: [facility];  Service: General;  Laterality: N/A;   UPPER GASTROINTESTINAL ENDOSCOPY  02/13/2020    Current Outpatient Medications  Medication Sig Dispense Refill   oxyCODONE -acetaminophen  (PERCOCET) 5-325 MG tablet Take 1 tablet by mouth every 6 (six) hours as needed for severe pain (pain score 7-10). Can increase to 2 pills at a time if needed 20 tablet 0   pantoprazole  (PROTONIX ) 40 MG tablet Take 1 tablet (40 mg total) by mouth 2 (two) times daily. 60 tablet 0   polyethylene glycol powder (GLYCOLAX /MIRALAX ) 17 GM/SCOOP powder Take 17 g by mouth daily as needed. Dissolve 1 capful (17g) in 4-8 ounces of liquid and take by mouth daily. 238 g 0   cholecalciferol  (VITAMIN D3) 25 MCG (1000 UNIT) tablet Take 1,000 Units by mouth daily. (Patient not taking: Reported on 11/30/2023)     ondansetron  (ZOFRAN ) 4 MG tablet Take 1 tablet (4 mg total) by mouth every 8 (eight) hours as needed for nausea. (Patient not taking: Reported on 11/30/2023) 8 tablet 5   polycarbophil (FIBERCON) 625 MG tablet Take 1 tablet (625 mg total) by mouth 2 (two) times daily. (Patient not taking: Reported on 11/30/2023) 60 tablet 0   No current facility-administered medications  for this visit.    Allergies as of 11/30/2023   (No Known Allergies)    Family History  Problem Relation Age of Onset   Colon cancer Maternal Aunt    Colon cancer Paternal Aunt    Heart disease Maternal Grandmother    Hypertension Maternal Grandfather    Diabetes Maternal Grandfather    Cancer Maternal Grandfather    Brain cancer Maternal Grandfather    Hypertension Paternal Grandmother    Diabetes Paternal Grandmother    Cancer Paternal Grandmother    Stomach cancer Paternal Grandmother    Esophageal cancer Neg Hx    Rectal cancer Neg Hx    Stroke Neg Hx     Review of Systems:    Constitutional: No weight loss, fever, chills, weakness or fatigue HEENT: Eyes: No change in vision               Ears, Nose, Throat:  No change in hearing or congestion Skin: No rash or itching Cardiovascular: No chest pain, chest pressure or palpitations   Respiratory: No SOB or cough Gastrointestinal: See HPI and otherwise negative Genitourinary: No dysuria or change in urinary frequency Neurological: No headache, dizziness or syncope Musculoskeletal: No new muscle or joint pain Hematologic: No bleeding or bruising Psychiatric: No history of depression or anxiety    Physical Exam:  Vital signs: BP 96/60 (  BP Location: Left Arm, Patient Position: Sitting, Cuff Size: Normal)   Pulse 82   Ht 5' 3 (1.6 m)   Wt 180 lb 6 oz (81.8 kg)   LMP 11/06/2023 Comment: Pregnancy serum test (-) negative on 11/09/2023  BMI 31.95 kg/m   Constitutional: Arabic female appears to be in NAD, Well nourished, alert and cooperative, has a somewhat flat effect Throat: Oral cavity and pharynx without inflammation, swelling or lesion.  Respiratory: Respirations even and unlabored. Lungs clear to auscultation bilaterally.   No wheezes, crackles, or rhonchi.  Cardiovascular: Normal S1, S2. Regular rate and rhythm. No peripheral edema, cyanosis or pallor.  Gastrointestinal:  Soft, nondistended, generalized  abdominal tenderness. No rebound or guarding. Normal bowel sounds. No appreciable masses or hepatomegaly. Rectal:  Not performed.  Msk:  Symmetrical without gross deformities. Without edema, no deformity or joint abnormality.  Neurologic:  Alert and  oriented x4;  grossly normal neurologically.  Skin:   Dry and intact without significant lesions or rashes.  RELEVANT LABS AND IMAGING: CBC    Latest Ref Rng & Units 11/13/2023    4:34 AM 11/12/2023    5:03 AM 11/11/2023    4:44 AM  CBC  WBC 4.0 - 10.5 K/uL 8.4  12.6  6.0   Hemoglobin 12.0 - 15.0 g/dL 88.9  88.1  88.5   Hematocrit 36.0 - 46.0 % 33.4  36.5  34.1   Platelets 150 - 400 K/uL 254  263  242      CMP     Latest Ref Rng & Units 11/13/2023    4:34 AM 11/12/2023    5:03 AM 11/11/2023    4:44 AM  CMP  Glucose 70 - 99 mg/dL 895  889  899   BUN 6 - 20 mg/dL 5  5  6    Creatinine 0.44 - 1.00 mg/dL 9.49  9.48  9.43   Sodium 135 - 145 mmol/L 142  140  140   Potassium 3.5 - 5.1 mmol/L 3.6  3.8  4.0   Chloride 98 - 111 mmol/L 106  105  106   CO2 22 - 32 mmol/L 23  20  24    Calcium  8.9 - 10.3 mg/dL 8.4  9.1  8.7   Total Protein 6.5 - 8.1 g/dL 5.9  6.4  5.8   Total Bilirubin 0.0 - 1.2 mg/dL 0.4  0.5  0.8   Alkaline Phos 38 - 126 U/L 122  147  153   AST 15 - 41 U/L 61  124  293   ALT 0 - 44 U/L 359  536  689    Lab Results  Component Value Date   IRON 35 11/12/2023   TIBC 332 11/12/2023   FERRITIN 39 11/12/2023   IgA-154, anti-smooth muscle antibody-4, mitochondrial antibody <20 and ANA - neg 11/10/23 hepatitis panel negative   Imaging: CT angio IMPRESSION: CTA of the chest: No evidence of pulmonary emboli.   No acute aortic abnormality is seen.   No acute abnormality seen.   CTA of the abdomen and pelvis: No acute aortic abnormality is noted.   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   No other focal abnormality is noted.  11/10/23 US  Abd RUQ IMPRESSION: 1. Cholelithiasis with no  acute cholecystitis. 2. Hepatic steatosis. Please note limited evaluation for focal hepatic masses in a patient with hepatic steatosis due to decreased penetration of the acoustic ultrasound waves.  11/11/23 liver biopsy FINAL MICROSCOPIC DIAGNOSIS:   A.  GALLBLADDER, CHOLECYSTECTOMY:  -  Chronic cholecystitis and cholelithiasis.   B. LIVER, NEEDLE CORE BIOPSY:  -  Liver with nonspecific portal and parenchymal inflammation and mild  steatosis (10 to 15%).      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 Unlikely medial arcuate ligament compression of celiac axis  #3 Chronic anemia, normocytic, reports heavy menstrual cycles. EGD/EUS shows gastritis, with neg bx. Folic acid  15, B12 715, iron 35, ferritin 39.  -recommend follow-up with OB/GYN -recheck CBC  #4 Hepatic steatosis. Acute hepatitis panel is negative.  IgA, anti-smooth muscle antibody, mitochondrial antibody and ANA negative. Intra surgical bx obtained. Liver biopsy with nonspecific portal and parenchymal inflammation and mild  steatosis (10 to 15%). There is a mild element in the portal tracts suggestive of resolving ascending cholangitis as well as a mild bile ductular proliferation.  There is minimal evidence of portal fibrosis without periportal fibrosis.  There is no stainable iron the overall findings are nonspecific but given the clinical history this is likely a  resolving injury secondary to an ascending cholangitis and bile duct obstruction with ongoing resolution.  -recheck hepatic function today  #5 Vitamin D  deficiency , complains of fatigue -recheck level today   Thank you for the  courtesy of this consult. Please call me with any questions or concerns.   Thadeus Gandolfi, FNP-C [facility] 11/30/2023, 10:49 AM  Cc: No ref. provider found

## 2023-12-01 ENCOUNTER — Ambulatory Visit: Payer: Self-pay | Admitting: Gastroenterology

## 2023-12-04 ENCOUNTER — Other Ambulatory Visit: Payer: Self-pay

## 2023-12-04 DIAGNOSIS — E559 Vitamin D deficiency, unspecified: Secondary | ICD-10-CM

## 2023-12-11 NOTE — Telephone Encounter (Signed)
-----   Message from Anne Whitaker sent at 12/01/2023 12:49 PM EDT ----- Recheck vitamin D  in 3 mth ----- Message ----- From: Interface, Lab In Three Zero One Sent: 11/30/2023   3:26 PM EDT To: Anne PARAS May, NP

## 2024-03-07 ENCOUNTER — Telehealth: Payer: Self-pay

## 2024-03-07 NOTE — Telephone Encounter (Signed)
 See telephone encounter.
